# Patient Record
Sex: Male | Born: 1965
Health system: Southern US, Community
[De-identification: ages and names within clinical notes are randomized; demographics above are authoritative.]

## PROBLEM LIST (undated history)

## (undated) DIAGNOSIS — I10 Essential (primary) hypertension: Secondary | ICD-10-CM

## (undated) DIAGNOSIS — M545 Low back pain, unspecified: Secondary | ICD-10-CM

## (undated) DIAGNOSIS — I2510 Atherosclerotic heart disease of native coronary artery without angina pectoris: Secondary | ICD-10-CM

## (undated) DIAGNOSIS — D869 Sarcoidosis, unspecified: Secondary | ICD-10-CM

## (undated) DIAGNOSIS — B9681 Helicobacter pylori [H. pylori] as the cause of diseases classified elsewhere: Secondary | ICD-10-CM

## (undated) DIAGNOSIS — F329 Major depressive disorder, single episode, unspecified: Secondary | ICD-10-CM

## (undated) DIAGNOSIS — K2970 Gastritis, unspecified, without bleeding: Secondary | ICD-10-CM

## (undated) DIAGNOSIS — M1990 Unspecified osteoarthritis, unspecified site: Secondary | ICD-10-CM

## (undated) DIAGNOSIS — H209 Unspecified iridocyclitis: Secondary | ICD-10-CM

## (undated) DIAGNOSIS — F32A Depression, unspecified: Secondary | ICD-10-CM

## (undated) DIAGNOSIS — K922 Gastrointestinal hemorrhage, unspecified: Secondary | ICD-10-CM

## (undated) DIAGNOSIS — R51 Headache: Secondary | ICD-10-CM

## (undated) DIAGNOSIS — R7302 Impaired glucose tolerance (oral): Secondary | ICD-10-CM

## (undated) HISTORY — DX: Unspecified osteoarthritis, unspecified site: M19.90

## (undated) HISTORY — DX: Headache: R51

## (undated) HISTORY — DX: Low back pain, unspecified: M54.50

## (undated) HISTORY — DX: Low back pain: M54.5

## (undated) HISTORY — PX: CATARACT EXTRACTION: SUR2

## (undated) HISTORY — DX: Morbid (severe) obesity due to excess calories: E66.01

## (undated) HISTORY — DX: Essential (primary) hypertension: I10

## (undated) HISTORY — DX: Helicobacter pylori (H. pylori) as the cause of diseases classified elsewhere: B96.81

## (undated) HISTORY — DX: Depression, unspecified: F32.A

## (undated) HISTORY — DX: Impaired glucose tolerance (oral): R73.02

## (undated) HISTORY — DX: Unspecified iridocyclitis: H20.9

## (undated) HISTORY — DX: Major depressive disorder, single episode, unspecified: F32.9

## (undated) HISTORY — DX: Gastrointestinal hemorrhage, unspecified: K92.2

## (undated) HISTORY — DX: Sarcoidosis, unspecified: D86.9

## (undated) HISTORY — DX: Atherosclerotic heart disease of native coronary artery without angina pectoris: I25.10

## (undated) HISTORY — DX: Gastritis, unspecified, without bleeding: K29.70

---

## 1997-09-16 ENCOUNTER — Emergency Department (HOSPITAL_COMMUNITY): Admission: EM | Admit: 1997-09-16 | Discharge: 1997-09-16 | Payer: Self-pay | Admitting: Emergency Medicine

## 1999-04-26 ENCOUNTER — Encounter: Payer: Self-pay | Admitting: Emergency Medicine

## 1999-04-26 ENCOUNTER — Emergency Department (HOSPITAL_COMMUNITY): Admission: EM | Admit: 1999-04-26 | Discharge: 1999-04-26 | Payer: Self-pay | Admitting: Emergency Medicine

## 1999-11-01 ENCOUNTER — Encounter: Payer: Self-pay | Admitting: Emergency Medicine

## 1999-11-01 ENCOUNTER — Emergency Department (HOSPITAL_COMMUNITY): Admission: EM | Admit: 1999-11-01 | Discharge: 1999-11-01 | Payer: Self-pay | Admitting: Emergency Medicine

## 2000-12-04 ENCOUNTER — Emergency Department (HOSPITAL_COMMUNITY): Admission: EM | Admit: 2000-12-04 | Discharge: 2000-12-04 | Payer: Self-pay | Admitting: Emergency Medicine

## 2003-05-07 ENCOUNTER — Observation Stay (HOSPITAL_COMMUNITY): Admission: EM | Admit: 2003-05-07 | Discharge: 2003-05-08 | Payer: Self-pay | Admitting: Emergency Medicine

## 2003-12-07 ENCOUNTER — Emergency Department (HOSPITAL_COMMUNITY): Admission: EM | Admit: 2003-12-07 | Discharge: 2003-12-07 | Payer: Self-pay

## 2004-02-28 ENCOUNTER — Emergency Department (HOSPITAL_COMMUNITY): Admission: EM | Admit: 2004-02-28 | Discharge: 2004-02-28 | Payer: Self-pay | Admitting: Emergency Medicine

## 2004-06-30 ENCOUNTER — Ambulatory Visit: Payer: Self-pay | Admitting: Internal Medicine

## 2004-10-18 ENCOUNTER — Ambulatory Visit: Payer: Self-pay | Admitting: Internal Medicine

## 2004-11-02 ENCOUNTER — Encounter: Admission: RE | Admit: 2004-11-02 | Discharge: 2004-11-02 | Payer: Self-pay | Admitting: Internal Medicine

## 2004-11-18 ENCOUNTER — Ambulatory Visit: Payer: Self-pay | Admitting: Internal Medicine

## 2005-02-13 ENCOUNTER — Ambulatory Visit: Payer: Self-pay | Admitting: Psychiatry

## 2005-02-13 ENCOUNTER — Inpatient Hospital Stay (HOSPITAL_COMMUNITY): Admission: AD | Admit: 2005-02-13 | Discharge: 2005-02-17 | Payer: Self-pay | Admitting: Psychiatry

## 2005-02-13 ENCOUNTER — Emergency Department (HOSPITAL_COMMUNITY): Admission: EM | Admit: 2005-02-13 | Discharge: 2005-02-13 | Payer: Self-pay | Admitting: Emergency Medicine

## 2005-04-19 ENCOUNTER — Emergency Department (HOSPITAL_COMMUNITY): Admission: EM | Admit: 2005-04-19 | Discharge: 2005-04-19 | Payer: Self-pay | Admitting: Family Medicine

## 2005-06-06 ENCOUNTER — Ambulatory Visit: Payer: Self-pay | Admitting: Internal Medicine

## 2005-06-20 ENCOUNTER — Ambulatory Visit: Payer: Self-pay | Admitting: Internal Medicine

## 2005-06-27 ENCOUNTER — Emergency Department (HOSPITAL_COMMUNITY): Admission: EM | Admit: 2005-06-27 | Discharge: 2005-06-28 | Payer: Self-pay | Admitting: Emergency Medicine

## 2005-07-14 ENCOUNTER — Ambulatory Visit: Payer: Self-pay | Admitting: Internal Medicine

## 2005-07-15 ENCOUNTER — Ambulatory Visit: Payer: Self-pay

## 2005-07-21 ENCOUNTER — Ambulatory Visit: Payer: Self-pay | Admitting: Internal Medicine

## 2005-08-04 ENCOUNTER — Ambulatory Visit: Payer: Self-pay | Admitting: Internal Medicine

## 2005-08-08 ENCOUNTER — Ambulatory Visit: Payer: Self-pay | Admitting: Internal Medicine

## 2005-08-09 ENCOUNTER — Ambulatory Visit: Payer: Self-pay | Admitting: Internal Medicine

## 2005-08-11 ENCOUNTER — Ambulatory Visit: Admission: RE | Admit: 2005-08-11 | Discharge: 2005-08-11 | Payer: Self-pay | Admitting: Internal Medicine

## 2005-08-11 ENCOUNTER — Encounter (INDEPENDENT_AMBULATORY_CARE_PROVIDER_SITE_OTHER): Payer: Self-pay | Admitting: *Deleted

## 2005-08-11 ENCOUNTER — Ambulatory Visit: Payer: Self-pay | Admitting: Internal Medicine

## 2005-08-15 ENCOUNTER — Ambulatory Visit: Payer: Self-pay | Admitting: Internal Medicine

## 2005-08-24 ENCOUNTER — Ambulatory Visit: Payer: Self-pay | Admitting: Internal Medicine

## 2005-09-09 ENCOUNTER — Ambulatory Visit: Payer: Self-pay | Admitting: Internal Medicine

## 2005-10-14 ENCOUNTER — Ambulatory Visit: Payer: Self-pay | Admitting: Internal Medicine

## 2005-10-18 ENCOUNTER — Ambulatory Visit: Payer: Self-pay | Admitting: Internal Medicine

## 2005-11-09 ENCOUNTER — Ambulatory Visit: Payer: Self-pay | Admitting: Internal Medicine

## 2005-12-22 ENCOUNTER — Ambulatory Visit: Payer: Self-pay | Admitting: Internal Medicine

## 2006-01-16 ENCOUNTER — Emergency Department (HOSPITAL_COMMUNITY): Admission: EM | Admit: 2006-01-16 | Discharge: 2006-01-16 | Payer: Self-pay | Admitting: Emergency Medicine

## 2006-02-23 ENCOUNTER — Emergency Department (HOSPITAL_COMMUNITY): Admission: EM | Admit: 2006-02-23 | Discharge: 2006-02-23 | Payer: Self-pay | Admitting: Emergency Medicine

## 2006-08-08 ENCOUNTER — Ambulatory Visit: Payer: Self-pay | Admitting: Pulmonary Disease

## 2006-09-01 ENCOUNTER — Ambulatory Visit: Payer: Self-pay | Admitting: Internal Medicine

## 2006-09-01 LAB — CONVERTED CEMR LAB
Angiotensin 1 Converting Enzyme: 42 units/L (ref 9–67)
Bilirubin, Direct: 0.1 mg/dL (ref 0.0–0.3)
CO2: 31 meq/L (ref 19–32)
Eosinophils Absolute: 0.1 10*3/uL (ref 0.0–0.6)
Eosinophils Relative: 0.7 % (ref 0.0–5.0)
GFR calc Af Amer: 85 mL/min
GFR calc non Af Amer: 70 mL/min
Glucose, Bld: 116 mg/dL — ABNORMAL HIGH (ref 70–99)
HCT: 41.4 % (ref 39.0–52.0)
Hemoglobin: 13.8 g/dL (ref 13.0–17.0)
Lymphocytes Relative: 12.8 % (ref 12.0–46.0)
MCV: 93.3 fL (ref 78.0–100.0)
Monocytes Absolute: 0.4 10*3/uL (ref 0.2–0.7)
Neutro Abs: 7.7 10*3/uL (ref 1.4–7.7)
Neutrophils Relative %: 81.3 % — ABNORMAL HIGH (ref 43.0–77.0)
Platelets: 192 10*3/uL (ref 150–400)
Sed Rate: 26 mm/hr — ABNORMAL HIGH (ref 0–20)
Sodium: 142 meq/L (ref 135–145)
Total Protein: 7.3 g/dL (ref 6.0–8.3)
WBC: 9.5 10*3/uL (ref 4.5–10.5)

## 2006-10-05 DIAGNOSIS — D869 Sarcoidosis, unspecified: Secondary | ICD-10-CM

## 2006-10-05 DIAGNOSIS — I10 Essential (primary) hypertension: Secondary | ICD-10-CM

## 2007-01-26 ENCOUNTER — Emergency Department (HOSPITAL_COMMUNITY): Admission: EM | Admit: 2007-01-26 | Discharge: 2007-01-26 | Payer: Self-pay | Admitting: Emergency Medicine

## 2007-02-23 ENCOUNTER — Emergency Department (HOSPITAL_COMMUNITY): Admission: EM | Admit: 2007-02-23 | Discharge: 2007-02-23 | Payer: Self-pay | Admitting: Emergency Medicine

## 2007-03-07 ENCOUNTER — Ambulatory Visit: Payer: Self-pay | Admitting: Internal Medicine

## 2007-03-07 LAB — CONVERTED CEMR LAB
ALT: 14 units/L (ref 0–53)
Albumin: 3.5 g/dL (ref 3.5–5.2)
Alkaline Phosphatase: 60 units/L (ref 39–117)
Basophils Relative: 0.2 % (ref 0.0–1.0)
CO2: 33 meq/L — ABNORMAL HIGH (ref 19–32)
Calcium: 9.1 mg/dL (ref 8.4–10.5)
Eosinophils Absolute: 0.2 10*3/uL (ref 0.0–0.6)
Eosinophils Relative: 2.9 % (ref 0.0–5.0)
Glucose, Bld: 94 mg/dL (ref 70–99)
MCV: 91.3 fL (ref 78.0–100.0)
Monocytes Relative: 9.7 % (ref 3.0–11.0)
Neutro Abs: 5.3 10*3/uL (ref 1.4–7.7)
Platelets: 195 10*3/uL (ref 150–400)
Potassium: 4.4 meq/L (ref 3.5–5.1)
RBC: 4.21 M/uL — ABNORMAL LOW (ref 4.22–5.81)
Sed Rate: 18 mm/hr (ref 0–20)
Total Bilirubin: 1 mg/dL (ref 0.3–1.2)
Total Protein: 7.5 g/dL (ref 6.0–8.3)
WBC: 7.6 10*3/uL (ref 4.5–10.5)

## 2007-04-10 DIAGNOSIS — E785 Hyperlipidemia, unspecified: Secondary | ICD-10-CM

## 2007-04-23 ENCOUNTER — Ambulatory Visit: Payer: Self-pay | Admitting: Internal Medicine

## 2007-04-23 LAB — CONVERTED CEMR LAB
AST: 17 units/L (ref 0–37)
Albumin: 3.8 g/dL (ref 3.5–5.2)
Basophils Absolute: 0 10*3/uL (ref 0.0–0.1)
CO2: 33 meq/L — ABNORMAL HIGH (ref 19–32)
Chloride: 100 meq/L (ref 96–112)
Creatinine, Ser: 1.1 mg/dL (ref 0.4–1.5)
Eosinophils Absolute: 0.1 10*3/uL (ref 0.0–0.6)
GFR calc Af Amer: 94 mL/min
Glucose, Bld: 94 mg/dL (ref 70–99)
HCT: 41.1 % (ref 39.0–52.0)
Hemoglobin: 14.2 g/dL (ref 13.0–17.0)
Lymphocytes Relative: 22.1 % (ref 12.0–46.0)
MCHC: 34.6 g/dL (ref 30.0–36.0)
MCV: 93 fL (ref 78.0–100.0)
Monocytes Absolute: 0.9 10*3/uL — ABNORMAL HIGH (ref 0.2–0.7)
Monocytes Relative: 10 % (ref 3.0–11.0)
Neutro Abs: 6.2 10*3/uL (ref 1.4–7.7)
Neutrophils Relative %: 66.2 % (ref 43.0–77.0)
RDW: 12 % (ref 11.5–14.6)
Sodium: 138 meq/L (ref 135–145)
Total Bilirubin: 1 mg/dL (ref 0.3–1.2)

## 2007-05-11 ENCOUNTER — Encounter: Payer: Self-pay | Admitting: Internal Medicine

## 2007-06-05 ENCOUNTER — Ambulatory Visit: Payer: Self-pay | Admitting: Internal Medicine

## 2007-06-14 ENCOUNTER — Ambulatory Visit: Payer: Self-pay | Admitting: Internal Medicine

## 2007-06-14 DIAGNOSIS — J45909 Unspecified asthma, uncomplicated: Secondary | ICD-10-CM | POA: Insufficient documentation

## 2007-06-14 DIAGNOSIS — R7302 Impaired glucose tolerance (oral): Secondary | ICD-10-CM

## 2007-06-14 DIAGNOSIS — M545 Low back pain: Secondary | ICD-10-CM

## 2007-08-16 ENCOUNTER — Ambulatory Visit: Payer: Self-pay | Admitting: Internal Medicine

## 2007-08-16 LAB — CONVERTED CEMR LAB
ALT: 24 units/L (ref 0–53)
AST: 18 units/L (ref 0–37)
Bilirubin, Direct: 0.1 mg/dL (ref 0.0–0.3)
Eosinophils Absolute: 0.2 10*3/uL (ref 0.0–0.6)
Eosinophils Relative: 2.7 % (ref 0.0–5.0)
HCT: 41.4 % (ref 39.0–52.0)
Hemoglobin: 13.4 g/dL (ref 13.0–17.0)
MCHC: 32.4 g/dL (ref 30.0–36.0)
MCV: 96.8 fL (ref 78.0–100.0)
Monocytes Absolute: 0.7 10*3/uL (ref 0.2–0.7)
Neutrophils Relative %: 66.4 % (ref 43.0–77.0)
RBC: 4.27 M/uL (ref 4.22–5.81)
RDW: 12.6 % (ref 11.5–14.6)
Total Protein: 7.4 g/dL (ref 6.0–8.3)
WBC: 7.6 10*3/uL (ref 4.5–10.5)

## 2007-09-13 ENCOUNTER — Ambulatory Visit: Payer: Self-pay | Admitting: Internal Medicine

## 2007-09-13 DIAGNOSIS — M79609 Pain in unspecified limb: Secondary | ICD-10-CM

## 2007-11-16 ENCOUNTER — Ambulatory Visit: Payer: Self-pay | Admitting: Internal Medicine

## 2007-11-16 LAB — CONVERTED CEMR LAB: Cholesterol: 215 mg/dL (ref 0–200)

## 2007-11-20 ENCOUNTER — Ambulatory Visit: Payer: Self-pay | Admitting: Internal Medicine

## 2007-11-20 ENCOUNTER — Telehealth: Payer: Self-pay | Admitting: Internal Medicine

## 2007-11-20 DIAGNOSIS — R109 Unspecified abdominal pain: Secondary | ICD-10-CM | POA: Insufficient documentation

## 2007-11-22 ENCOUNTER — Telehealth: Payer: Self-pay | Admitting: Internal Medicine

## 2007-11-22 LAB — CONVERTED CEMR LAB
AST: 28 units/L (ref 0–37)
Albumin: 3.9 g/dL (ref 3.5–5.2)
Amylase: 81 units/L (ref 27–131)
BUN: 22 mg/dL (ref 6–23)
Basophils Relative: 0.5 % (ref 0.0–1.0)
Calcium: 9.6 mg/dL (ref 8.4–10.5)
Creatinine, Ser: 1.2 mg/dL (ref 0.4–1.5)
Eosinophils Absolute: 0.1 10*3/uL (ref 0.0–0.7)
Eosinophils Relative: 1 % (ref 0.0–5.0)
GFR calc Af Amer: 85 mL/min
GFR calc non Af Amer: 70 mL/min
HCT: 41.1 % (ref 39.0–52.0)
MCHC: 34.3 g/dL (ref 30.0–36.0)
MCV: 94.6 fL (ref 78.0–100.0)
Monocytes Absolute: 0.2 10*3/uL (ref 0.1–1.0)
Neutro Abs: 11.9 10*3/uL — ABNORMAL HIGH (ref 1.4–7.7)
RBC: 4.34 M/uL (ref 4.22–5.81)
WBC: 12.9 10*3/uL — ABNORMAL HIGH (ref 4.5–10.5)

## 2008-02-18 ENCOUNTER — Ambulatory Visit: Payer: Self-pay | Admitting: Internal Medicine

## 2008-02-18 LAB — CONVERTED CEMR LAB: Blood Glucose, Fingerstick: 83

## 2008-05-16 ENCOUNTER — Ambulatory Visit: Payer: Self-pay | Admitting: Internal Medicine

## 2008-08-04 ENCOUNTER — Ambulatory Visit: Payer: Self-pay | Admitting: Internal Medicine

## 2008-08-04 DIAGNOSIS — R519 Headache, unspecified: Secondary | ICD-10-CM | POA: Insufficient documentation

## 2008-08-04 DIAGNOSIS — R51 Headache: Secondary | ICD-10-CM

## 2008-08-04 LAB — CONVERTED CEMR LAB
ALT: 13 units/L (ref 0–53)
AST: 15 units/L (ref 0–37)
Basophils Absolute: 0 10*3/uL (ref 0.0–0.1)
Basophils Relative: 0.1 % (ref 0.0–3.0)
Bilirubin, Direct: 0.1 mg/dL (ref 0.0–0.3)
CO2: 33 meq/L — ABNORMAL HIGH (ref 19–32)
Chloride: 101 meq/L (ref 96–112)
Creatinine, Ser: 1 mg/dL (ref 0.4–1.5)
Lymphocytes Relative: 21.6 % (ref 12.0–46.0)
MCHC: 34.1 g/dL (ref 30.0–36.0)
Neutrophils Relative %: 67.2 % (ref 43.0–77.0)
RBC: 4.28 M/uL (ref 4.22–5.81)
Total Bilirubin: 0.9 mg/dL (ref 0.3–1.2)
WBC: 8.3 10*3/uL (ref 4.5–10.5)

## 2008-08-07 ENCOUNTER — Encounter (INDEPENDENT_AMBULATORY_CARE_PROVIDER_SITE_OTHER): Payer: Self-pay | Admitting: *Deleted

## 2008-10-02 ENCOUNTER — Ambulatory Visit: Payer: Self-pay | Admitting: Internal Medicine

## 2008-11-30 ENCOUNTER — Emergency Department (HOSPITAL_COMMUNITY): Admission: EM | Admit: 2008-11-30 | Discharge: 2008-11-30 | Payer: Self-pay | Admitting: Emergency Medicine

## 2008-12-18 ENCOUNTER — Ambulatory Visit: Payer: Self-pay | Admitting: Internal Medicine

## 2008-12-18 DIAGNOSIS — F341 Dysthymic disorder: Secondary | ICD-10-CM

## 2009-01-02 ENCOUNTER — Ambulatory Visit: Payer: Self-pay | Admitting: Internal Medicine

## 2009-02-05 ENCOUNTER — Encounter: Payer: Self-pay | Admitting: Internal Medicine

## 2009-06-02 ENCOUNTER — Ambulatory Visit: Payer: Self-pay | Admitting: Internal Medicine

## 2009-06-20 ENCOUNTER — Emergency Department (HOSPITAL_COMMUNITY): Admission: EM | Admit: 2009-06-20 | Discharge: 2009-06-20 | Payer: Self-pay | Admitting: Emergency Medicine

## 2009-07-03 ENCOUNTER — Ambulatory Visit: Payer: Self-pay | Admitting: Internal Medicine

## 2009-08-19 ENCOUNTER — Inpatient Hospital Stay (HOSPITAL_COMMUNITY)
Admission: EM | Admit: 2009-08-19 | Discharge: 2009-08-22 | Payer: Self-pay | Source: Home / Self Care | Admitting: Emergency Medicine

## 2009-08-20 ENCOUNTER — Ambulatory Visit: Payer: Self-pay | Admitting: Internal Medicine

## 2009-08-21 ENCOUNTER — Encounter: Payer: Self-pay | Admitting: Internal Medicine

## 2009-08-23 ENCOUNTER — Inpatient Hospital Stay (HOSPITAL_COMMUNITY): Admission: EM | Admit: 2009-08-23 | Discharge: 2009-08-27 | Payer: Self-pay | Admitting: Emergency Medicine

## 2009-08-24 ENCOUNTER — Encounter: Payer: Self-pay | Admitting: Gastroenterology

## 2009-08-24 ENCOUNTER — Encounter (INDEPENDENT_AMBULATORY_CARE_PROVIDER_SITE_OTHER): Payer: Self-pay | Admitting: Internal Medicine

## 2009-09-07 ENCOUNTER — Ambulatory Visit: Payer: Self-pay | Admitting: Internal Medicine

## 2009-09-07 DIAGNOSIS — A048 Other specified bacterial intestinal infections: Secondary | ICD-10-CM | POA: Insufficient documentation

## 2009-09-14 ENCOUNTER — Ambulatory Visit: Payer: Self-pay | Admitting: Internal Medicine

## 2009-09-14 ENCOUNTER — Inpatient Hospital Stay (HOSPITAL_COMMUNITY)
Admission: EM | Admit: 2009-09-14 | Discharge: 2009-09-18 | Payer: Self-pay | Source: Home / Self Care | Admitting: Emergency Medicine

## 2009-09-15 ENCOUNTER — Encounter (INDEPENDENT_AMBULATORY_CARE_PROVIDER_SITE_OTHER): Payer: Self-pay | Admitting: Cardiology

## 2009-09-26 ENCOUNTER — Emergency Department (HOSPITAL_COMMUNITY): Admission: EM | Admit: 2009-09-26 | Discharge: 2009-09-26 | Payer: Self-pay | Admitting: Emergency Medicine

## 2009-11-28 ENCOUNTER — Emergency Department (HOSPITAL_COMMUNITY): Admission: EM | Admit: 2009-11-28 | Discharge: 2009-11-28 | Payer: Self-pay | Admitting: Emergency Medicine

## 2009-11-29 ENCOUNTER — Emergency Department (HOSPITAL_COMMUNITY): Admission: EM | Admit: 2009-11-29 | Discharge: 2009-11-29 | Payer: Self-pay | Admitting: Emergency Medicine

## 2009-12-03 ENCOUNTER — Ambulatory Visit: Payer: Self-pay | Admitting: Family Medicine

## 2009-12-03 DIAGNOSIS — R1084 Generalized abdominal pain: Secondary | ICD-10-CM | POA: Insufficient documentation

## 2009-12-03 DIAGNOSIS — N410 Acute prostatitis: Secondary | ICD-10-CM

## 2009-12-03 LAB — CONVERTED CEMR LAB
Blood in Urine, dipstick: NEGATIVE
Nitrite: NEGATIVE
Protein, U semiquant: NEGATIVE
Urobilinogen, UA: 0.2
WBC Urine, dipstick: NEGATIVE

## 2009-12-04 LAB — CONVERTED CEMR LAB
Basophils Absolute: 0 10*3/uL (ref 0.0–0.1)
Eosinophils Absolute: 0.2 10*3/uL (ref 0.0–0.7)
HCT: 41.8 % (ref 39.0–52.0)
Hemoglobin: 14 g/dL (ref 13.0–17.0)
Lymphs Abs: 2.6 10*3/uL (ref 0.7–4.0)
MCHC: 33.4 g/dL (ref 30.0–36.0)
Neutro Abs: 6.8 10*3/uL (ref 1.4–7.7)
RDW: 12.8 % (ref 11.5–14.6)

## 2009-12-11 ENCOUNTER — Ambulatory Visit: Payer: Self-pay | Admitting: Internal Medicine

## 2009-12-11 LAB — CONVERTED CEMR LAB
BUN: 17 mg/dL (ref 6–23)
Bilirubin, Direct: 0.1 mg/dL (ref 0.0–0.3)
CO2: 32 meq/L (ref 19–32)
Calcium: 9.2 mg/dL (ref 8.4–10.5)
Cholesterol: 216 mg/dL — ABNORMAL HIGH (ref 0–200)
Creatinine, Ser: 1.1 mg/dL (ref 0.4–1.5)
TSH: 0.94 microintl units/mL (ref 0.35–5.50)
Total Bilirubin: 0.5 mg/dL (ref 0.3–1.2)
Total Protein: 7.8 g/dL (ref 6.0–8.3)

## 2009-12-13 ENCOUNTER — Emergency Department (HOSPITAL_COMMUNITY): Admission: EM | Admit: 2009-12-13 | Discharge: 2009-12-13 | Payer: Self-pay | Admitting: Emergency Medicine

## 2009-12-16 ENCOUNTER — Ambulatory Visit: Payer: Self-pay | Admitting: Cardiology

## 2010-01-27 ENCOUNTER — Telehealth: Payer: Self-pay | Admitting: Family Medicine

## 2010-01-27 ENCOUNTER — Emergency Department (HOSPITAL_COMMUNITY): Admission: EM | Admit: 2010-01-27 | Discharge: 2010-01-27 | Payer: Self-pay | Admitting: Emergency Medicine

## 2010-01-28 ENCOUNTER — Ambulatory Visit: Payer: Self-pay | Admitting: Internal Medicine

## 2010-03-16 ENCOUNTER — Observation Stay (HOSPITAL_COMMUNITY): Admission: EM | Admit: 2010-03-16 | Discharge: 2010-03-16 | Payer: Self-pay | Admitting: Emergency Medicine

## 2010-06-04 ENCOUNTER — Emergency Department (HOSPITAL_COMMUNITY)
Admission: EM | Admit: 2010-06-04 | Discharge: 2010-06-05 | Payer: Self-pay | Source: Home / Self Care | Admitting: Emergency Medicine

## 2010-06-11 ENCOUNTER — Other Ambulatory Visit: Payer: Self-pay | Admitting: Internal Medicine

## 2010-06-11 ENCOUNTER — Ambulatory Visit
Admission: RE | Admit: 2010-06-11 | Discharge: 2010-06-11 | Payer: Self-pay | Source: Home / Self Care | Attending: Internal Medicine | Admitting: Internal Medicine

## 2010-06-11 DIAGNOSIS — I472 Ventricular tachycardia: Secondary | ICD-10-CM | POA: Insufficient documentation

## 2010-06-11 LAB — LIPID PANEL
Cholesterol: 214 mg/dL — ABNORMAL HIGH (ref 0–200)
HDL: 58.8 mg/dL (ref >39.00)
Total CHOL/HDL Ratio: 4
Triglycerides: 68 mg/dL (ref 0.0–149.0)
VLDL: 13.6 mg/dL (ref 0.0–40.0)

## 2010-06-11 LAB — LDL CHOLESTEROL, DIRECT: Direct LDL: 151.1 mg/dL

## 2010-06-11 LAB — PSA: PSA: 0.33 ng/mL (ref 0.10–4.00)

## 2010-06-11 LAB — CONVERTED CEMR LAB: Blood Glucose, Fasting: 71 mg/dL

## 2010-06-11 LAB — HEMOGLOBIN A1C: Hgb A1c MFr Bld: 5.3 % (ref 4.6–6.5)

## 2010-06-16 ENCOUNTER — Ambulatory Visit
Admission: RE | Admit: 2010-06-16 | Discharge: 2010-06-16 | Payer: Self-pay | Source: Home / Self Care | Attending: Internal Medicine | Admitting: Internal Medicine

## 2010-06-16 ENCOUNTER — Encounter: Payer: Self-pay | Admitting: Internal Medicine

## 2010-06-16 DIAGNOSIS — I2510 Atherosclerotic heart disease of native coronary artery without angina pectoris: Secondary | ICD-10-CM | POA: Insufficient documentation

## 2010-06-17 ENCOUNTER — Telehealth: Payer: Self-pay | Admitting: Internal Medicine

## 2010-06-18 ENCOUNTER — Telehealth (INDEPENDENT_AMBULATORY_CARE_PROVIDER_SITE_OTHER): Payer: Self-pay | Admitting: *Deleted

## 2010-06-19 ENCOUNTER — Encounter: Payer: Self-pay | Admitting: Orthopedic Surgery

## 2010-06-20 ENCOUNTER — Encounter: Payer: Self-pay | Admitting: Internal Medicine

## 2010-06-29 NOTE — Assessment & Plan Note (Signed)
Summary: headaches/dm   Vital Signs:  Patient profile:   45 year old male Temp:     98.4 degrees F oral BP sitting:   122 / 84  (left arm) Cuff size:   large  Vitals Entered By: Raechel Ache, RN (June 02, 2009 4:05 PM) CC: C/o headaches. Not taking BP med.   Primary Care Provider:  Gordy Savers  MD  CC:  C/o headaches. Not taking BP med.Marland Kitchen  History of Present Illness: 45 year old patient who presents with a 4 week history of headaches.  He describes multiple episodes of brief fleeting, sharp, shooting pain in a rather generalized distribution.  He states the pain is quite sharp.  The pain  lasts  just a second is localized in the frontal bitemporal and also occipital area.  No other associated symptoms. He has a history of hypertension, but is titrated himself off medication.  He continues to monitor her home low-pressure readings with nice results.  He takes no other medication.  He also has a history of depression, which has been stable off Cymbalta.  Allergies: 1)  ! Codeine  Past History:  Past Medical History: Hypertension Sarcoidosis    FOB 08/11/05 pos ncg with airway involvemnt, steroid dep since 3/07 Mobid obesity, wt 340 when started prednisone history glucose intolerance ocular sarcoid ( uveitis) Asthma Low back pain  right knee, DJD Headache depression 2006  Review of Systems       The patient complains of headaches.  The patient denies anorexia, fever, weight loss, weight gain, vision loss, decreased hearing, hoarseness, chest pain, syncope, dyspnea on exertion, peripheral edema, prolonged cough, hemoptysis, abdominal pain, melena, hematochezia, severe indigestion/heartburn, hematuria, incontinence, genital sores, muscle weakness, suspicious skin lesions, transient blindness, difficulty walking, depression, unusual weight change, abnormal bleeding, enlarged lymph nodes, angioedema, breast masses, and testicular masses.    Physical Exam  General:   overweight-appearing.  blood pressure 100/72 Head:  Normocephalic and atraumatic without obvious abnormalities. No apparent alopecia or balding. Eyes:  right fundus not well visualized Ears:  External ear exam shows no significant lesions or deformities.  Otoscopic examination reveals clear canals, tympanic membranes are intact bilaterally without bulging, retraction, inflammation or discharge. Hearing is grossly normal bilaterally. Nose:  External nasal examination shows no deformity or inflammation. Nasal mucosa are pink and moist without lesions or exudates. Mouth:  Oral mucosa and oropharynx without lesions or exudates.  Teeth in good repair. Neck:  No deformities, masses, or tenderness noted. Lungs:  Normal respiratory effort, chest expands symmetrically. Lungs are clear to auscultation, no crackles or wheezes. Heart:  Normal rate and regular rhythm. S1 and S2 normal without gallop, murmur, click, rub or other extra sounds.   Impression & Recommendations:  Problem # 1:  HEADACHE (ICD-784.0)  Complete Medication List: 1)  Methotrexate 2.5 Mg Tabs (Methotrexate sodium) .... 6 weekly---hold 2)  Lisinopril-hydrochlorothiazide 20-25 Mg Tabs (Lisinopril-hydrochlorothiazide) .... One daily  Patient Instructions: 1)  Please schedule a follow-up appointment as needed. 2)  Arthrotec one twice daily as needed 3)  Take 650-1000mg  of Tylenol every 4-6 hours as needed for relief of pain or comfort of fever AVOID taking more than 4000mg   in a 24 hour period (can cause liver damage in higher doses).

## 2010-06-29 NOTE — Progress Notes (Signed)
Summary: Severe Headache.  Phone Note Call from Patient   Caller: Patient Summary of Call: pt called to report has head headache for past 5 days "headache like never before". states took about 30 tyleonl --2 tabs every hour.  c/o nausea and arm feeling weak. states not on any meds and denies any speech difficulties. Pt was advised to let family member come and take him to ER or call 911 and be transported to ER. Initial call taken by: Pura Spice, RN,  January 27, 2010 3:02 PM  Follow-up for Phone Call        Good plan Follow-up by: Danise Edge MD,  January 27, 2010 3:07 PM

## 2010-06-29 NOTE — Procedures (Signed)
Summary: EGD with biopsy, balloon dilatation/[facility]  EGD with biopsy, balloon dilatation/[facility]   Imported By: Maryln Gottron 09/28/2009 13:26:59  _____________________________________________________________________  External Attachment:    Type:   Image     Comment:   External Document

## 2010-06-29 NOTE — Assessment & Plan Note (Signed)
Summary: SIDE/BACK PAIN/RCD   Vital Signs:  Patient profile:   45 year old male Temp:     98.4 degrees F oral BP sitting:   130 / 80  (left arm) Cuff size:   large  Vitals Entered By: Duard Brady LPN (December 11, 2009 11:51 AM) CC: c/o low back pain , (L) and (R) side pain Is Patient Diabetic? No   Primary Care Provider:  Gordy Savers  MD  CC:  c/o low back pain  and (L) and (R) side pain.  History of Present Illness: 45 year old patient, who presents with a chief complaint of low back, flank, and generalized abdominal pain.  He has a history of sarcoidosis in the past.  He does have chronic low back pain and morbid obesity.  He has been treated in the past for H. pylori gastritis.  He was seen recently and is completing antibiotic therapy for suspected acute prostatitis.  He is on no medication other than antibiotic therapy.  He denies any nausea, vomiting, fever, chills, weight loss.  He also has a history of asthma, which has been stable  Allergies: 1)  ! Codeine  Past History:  Past Medical History: Reviewed history from 09/07/2009 and no changes required. Hypertension Sarcoidosis    FOB 08/11/05 pos ncg with airway involvemnt, steroid dep since 3/07 Mobid obesity, wt 340 when started prednisone history glucose intolerance ocular sarcoid ( uveitis) Asthma Low back pain  right knee, DJD Headache depression  upper GI bleed March 2011 H. pylori gastritis  Review of Systems       The patient complains of abdominal pain and muscle weakness.  The patient denies anorexia, fever, weight loss, weight gain, vision loss, decreased hearing, hoarseness, chest pain, syncope, dyspnea on exertion, peripheral edema, prolonged cough, headaches, hemoptysis, melena, hematochezia, severe indigestion/heartburn, hematuria, incontinence, genital sores, suspicious skin lesions, transient blindness, difficulty walking, depression, unusual weight change, abnormal bleeding, enlarged  lymph nodes, angioedema, breast masses, and testicular masses.    Physical Exam  General:  morbidly obese.  Blood pressure 130/80.  He appears in no acute distress Head:  Normocephalic and atraumatic without obvious abnormalities. No apparent alopecia or balding. Mouth:  Oral mucosa and oropharynx without lesions or exudates.  Teeth in good repair. Neck:  No deformities, masses, or tenderness noted. Lungs:  Normal respiratory effort, chest expands symmetrically. Lungs are clear to auscultation, no crackles or wheezes. Heart:  Normal rate and regular rhythm. S1 and S2 normal without gallop, murmur, click, rub or other extra sounds. Abdomen:  morbidly obese there is clinical suspicion for hepatic and possibly splenic enlargement he appear to be mildly diffusely tender; bowel sounds were normal no guarding or rebound Msk:  No deformity or scoliosis noted of thoracic or lumbar spine.   Extremities:  No clubbing, cyanosis, edema, or deformity noted with normal full range of motion of all joints.   Skin:  Intact without suspicious lesions or rashes Cervical Nodes:  No lymphadenopathy noted   Impression & Recommendations:  Problem # 1:  ABDOMINAL PAIN, GENERALIZED (ICD-789.07)  Orders: Venipuncture (16109) TLB-BMP (Basic Metabolic Panel-BMET) (80048-METABOL) TLB-Hepatic/Liver Function Pnl (80076-HEPATIC) TLB-TSH (Thyroid Stimulating Hormone) (60454-UJW) Radiology Referral (Radiology)  Problem # 2:  ACUTE PROSTATITIS (ICD-601.0)  Orders: Venipuncture (11914) TLB-BMP (Basic Metabolic Panel-BMET) (80048-METABOL) TLB-Hepatic/Liver Function Pnl (80076-HEPATIC) TLB-TSH (Thyroid Stimulating Hormone) (84443-TSH)  Problem # 3:  LOW BACK PAIN (ICD-724.2)  The following medications were removed from the medication list:    Aspir-low 81 Mg Tbec (Aspirin) ..... Qd  His updated medication list for this problem includes:    Tramadol Hcl 50 Mg Tabs (Tramadol hcl) ..... One every 6 hours for  pain  The following medications were removed from the medication list:    Aspir-low 81 Mg Tbec (Aspirin) ..... Qd His updated medication list for this problem includes:    Tramadol Hcl 50 Mg Tabs (Tramadol hcl) ..... One every 6 hours for pain  Problem # 4:  HYPERTENSION (ICD-401.9)  The following medications were removed from the medication list:    Lisinopril-hydrochlorothiazide 20-25 Mg Tabs (Lisinopril-hydrochlorothiazide) ..... One daily    Metoprolol Tartrate 25 Mg Tabs (Metoprolol tartrate) .Marland Kitchen... Take 1/2 bid    The following medications were removed from the medication list:    Lisinopril-hydrochlorothiazide 20-25 Mg Tabs (Lisinopril-hydrochlorothiazide) ..... One daily    Metoprolol Tartrate 25 Mg Tabs (Metoprolol tartrate) .Marland Kitchen... Take 1/2 bid  Orders: Venipuncture (81191) TLB-BMP (Basic Metabolic Panel-BMET) (80048-METABOL) TLB-Hepatic/Liver Function Pnl (80076-HEPATIC) TLB-TSH (Thyroid Stimulating Hormone) (84443-TSH)  Complete Medication List: 1)  Ciprofloxacin Hcl 500 Mg Tabs (Ciprofloxacin hcl) .... One by mouth two times a day for 10 days 2)  Tramadol Hcl 50 Mg Tabs (Tramadol hcl) .... One every 6 hours for pain  Other Orders: TLB-Cholesterol, Total (82465-CHO)  Patient Instructions: 1)  Please schedule a follow-up appointment in 2 weeks. 2)  Limit your Sodium (Salt). Prescriptions: TRAMADOL HCL 50 MG TABS (TRAMADOL HCL) one every 6 hours for pain  #50 x 3   Entered and Authorized by:   Gordy Savers  MD   Signed by:   Gordy Savers  MD on 12/11/2009   Method used:   Electronically to        [facility] [phone]* (retail)       [address]       [place], [place]  [address]       Ph: [phone]       Fax: [phone]   RxID:   [id number]

## 2010-06-29 NOTE — Procedures (Signed)
Summary: Upper Endoscopy  Patient: [name] Note: All result statuses are Final unless otherwise noted.  Tests: (1) Upper Endoscopy (EGD)   EGD Upper Endoscopy       DONE     [facility]     [address]     [place], [place]  [address]           ENDOSCOPY PROCEDURE REPORT           PATIENT:  [name], Marrow  MR#:  [id number]     BIRTHDATE:  [birth year], 44 yrs. old  GENDER:  male           ENDOSCOPIST:  Barbette Hair. Arlyce Dice, MD     Referred by:           PROCEDURE DATE:  08/24/2009     PROCEDURE:  EGD with biopsy     ASA CLASS:  Class II     INDICATIONS:  hematemesis           MEDICATIONS:   Fentanyl 75 mcg IV, Versed 7.5 mg IV,     glycopyrrolate (Robinal) 0.2 mg IV     TOPICAL ANESTHETIC:  Cetacaine Spray           DESCRIPTION OF PROCEDURE:   After the risks benefits and     alternatives of the procedure were thoroughly explained, informed     consent was obtained.  The EG-2990i ([id number]) endoscope was     introduced through the mouth and advanced to the third portion of     the duodenum, without limitations.  The instrument was slowly     withdrawn as the mucosa was fully examined.     <<PROCEDUREIMAGES>>           irregular Z-line at the gastroesophageal junction. Irregular Z     line with extension of gastric appearing mucosa 1-2cm proximally.     Bxs taken (see image008).  Mild gastritis was found in the antrum     (see image005).  Duodenitis was found (see image003). No fresh or     old blood in duodenum or stomach    Retroflexed views revealed no     abnormalities.    The scope was then withdrawn from the patient     and the procedure completed.           COMPLICATIONS:  None           ENDOSCOPIC IMPRESSION:     1) Irregular Z-line at the gastroesophageal junction - r/o     Barrett's esophagus     2) Mild gastritis in the antrum     3) Duodenitis           No definite source for limited hemetemasis identified        RECOMMENDATIONS:     1) CT Scan     2) continue PPI therapy           REPEAT EXAM:  No           ______________________________     Barbette Hair. Arlyce Dice, MD           CC:  Yancey Flemings, MD           n.     Rosalie Doctor:   Barbette Hair. Zaviyar Rahal at 08/24/2009 03:21 PM           [place], [place], [phone]  Note: An exclamation mark (!) indicates a result that was not dispersed into the flowsheet. Document Creation  Date: 08/24/2009 3:22 PM _______________________________________________________________________  (1) Order result status: Final Collection or observation date-time: 08/24/2009 15:15 Requested date-time:  Receipt date-time:  Reported date-time:  Referring Physician:   Ordering Physician: Melvia Heaps [phone]) Specimen Source:  Source: Launa Grill Order Number: [id number] Lab site:

## 2010-06-29 NOTE — Miscellaneous (Signed)
Summary: CLO report  Clinical Lists Changes  -Clotest (H. pylori), Biopsy - STATUS: Final                                            Perform Date: 25Mar11 16:30  Ordered By: Earley Favor Date: 25Mar11 16:34                                       Last Updated Date: 26Mar11 17:37  Facility: [facility]                              Department: [facility]CR  Accession #: [id number]                  USN:       [id number]  Findings  Result Name                              Result     Abnl   Normal Range     Units      Perf. Loc.  Helicobacter Screen                      SEE NOTE.  a      URENEG    UREASE POSITIVE    CLOTEST DETECTS THE UREASE    ENZYME OF HELICOBACTER    PYLORI IN GASTRIC MUCOSAL    BIOPSIES.  Additional Information  HL7 RESULT STATUS : F  External IF Update Timestamp : 2009-08-22:17:34:00.000000

## 2010-06-29 NOTE — Procedures (Signed)
Summary: Upper Endoscopy  Patient: [name] Note: All result statuses are Final unless otherwise noted.  Tests: (1) Upper Endoscopy (EGD)   EGD Upper Endoscopy       DONE     [facility]     [address]     [place], [place]  [address]           ENDOSCOPY PROCEDURE REPORT           PATIENT:  [name], [name]  MR#:  [id number]     BIRTHDATE:  1966/01/30, 44 yrs. old  GENDER:  male           ENDOSCOPIST:  Wilhemina Bonito. Eda Keys, MD     Referred by:  [facility]           PROCEDURE DATE:  08/21/2009     PROCEDURE:  EGD with biopsy,     EGD with balloon dilatation -     18-24mm     ASA CLASS:  Class III     INDICATIONS:  dysphagia, GERD           MEDICATIONS:   Fentanyl 75 mcg IV, Versed 6 mg IV     TOPICAL ANESTHETIC:  Cetacaine Spray           DESCRIPTION OF PROCEDURE:   After the risks benefits and     alternatives of the procedure were thoroughly explained, informed     consent was obtained.  The EG-2990i ([id number]) endoscope was     introduced through the mouth and advanced to the second portion of     the duodenum, without limitations.  The instrument was slowly     withdrawn as the mucosa was fully examined.     <<PROCEDUREIMAGES>>           A large caliber  esophageal ring was found in the distal     esophagus.  Moderate erosive gastritis was found in the antrum.     Nonerosive Duodenitis was found in the bulb of the duodenum. CLO Bx     taken.    Retroflexed views revealed a hiatal hernia.           THERAPY: TTS BALLOON DILATION 18-19-20MM   W/O RESISTANCE OR HEME.     TOLERATED WELL           The scope was then withdrawn from the patient and the procedure     completed.           COMPLICATIONS:  None           ENDOSCOPIC IMPRESSION:     1) Ring, esophageal in the distal esophagus - S/P DILATION     2) Moderate gastritis in the antrum - S/P CLO TEST     3) Duodenitis in the bulb of duodenum     4) A hiatal hernia        RECOMMENDATIONS:     1) CONTINUE PPI bid     2) Rx CLO if positive     30 GI F/U PRN           ______________________________     Wilhemina Bonito. Eda Keys, MD           CC:  Gordy Savers, MD, [facility] TEAM 5           n.     eSIGNED:   Wilhemina Bonito. Eda Keys at 08/21/2009 03:05 PM  [place], [place], [phone]  Note: An exclamation mark (!) indicates a result that was not dispersed into the flowsheet. Document Creation Date: 08/21/2009 3:06 PM _______________________________________________________________________  (1) Order result status: Final Collection or observation date-time: 08/21/2009 14:56 Requested date-time:  Receipt date-time:  Reported date-time:  Referring Physician:   Ordering Physician: Fransico Setters [phone]) Specimen Source:  Source: Launa Grill Order Number: [id number] Lab site:

## 2010-06-29 NOTE — Assessment & Plan Note (Signed)
Summary: PAIN IN ABD SIDE X 2 DAYS/HEADACHE/K PT   Vital Signs:  Patient profile:   45 year old male Temp:     98 degrees F oral BP sitting:   130 / 90  (left arm) Cuff size:   large  Vitals Entered By: Sid Falcon LPN (December 03, 1608 2:58 PM) CC: abdominal pain, trouble with urination   History of Present Illness: Patient is a work in with a history of poorly localized abdominal pain. Also complains of some intermittent headaches and diffuse body aches and poorly localized backache. He describes some trouble urinating with slightly slow stream and some mild burning with urination. No fever. Possible chills off and on. Denies nausea, vomiting, or diarrhea. No penile discharge.  No exacerbating or alleviating factors.  Medical problems include morbid obesity, hypertension, history sarcoidosis, hyperlipidemia, reported asthma history. He is currently taking no medications. History of poor compliance with medications.  Allergies: 1)  ! Codeine  Past History:  Past Medical History: Last updated: 09/07/2009 Hypertension Sarcoidosis    FOB 08/11/05 pos ncg with airway involvemnt, steroid dep since 3/07 Mobid obesity, wt 340 when started prednisone history glucose intolerance ocular sarcoid ( uveitis) Asthma Low back pain  right knee, DJD Headache depression  upper GI bleed March 2011 H. pylori gastritis  Past Surgical History: Last updated: 06/14/2007 status post left cataract surgery  Social History: Last updated: 06/14/2007 Patient never smoked.  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  The patient denies anorexia, fever, weight loss, weight gain, chest pain, syncope, dyspnea on exertion,  peripheral edema, prolonged cough, melena, hematochezia, hematuria, and incontinence.    Physical Exam  General:  Morbidly obese in no acute distress; alert,appropriate and cooperative throughout examination Head:  Normocephalic and atraumatic without obvious abnormalities. No apparent alopecia or balding. Mouth:  Oral mucosa and oropharynx without lesions or exudates.  Teeth in good repair. Neck:  No deformities, masses, or tenderness noted. Lungs:  Normal respiratory effort, chest expands symmetrically. Lungs are clear to auscultation, no crackles or wheezes. Heart:  normal rate and regular rhythm.   Abdomen:  Slightly tender RUQ and diffuse lower abd bil.soft, normal bowel sounds, no distention, no guarding, no rigidity, no rebound tenderness, no abdominal hernia, no hepatomegaly, and no splenomegaly.   Rectal:  No external abnormalities noted. Normal sphincter tone. No rectal masses or tenderness. Prostate:  tender to palpation but not enlarged.   Impression & Recommendations:  Problem # 1:  ACUTE PROSTATITIS (ICD-601.0) Assessment New start cipro.  Difficult to determine if this is accounting for some of his symptoms.  Problem # 2:  ABDOMINAL PAIN, GENERALIZED (ICD-789.07) check CBC.  Doubt related to #1.  benign exam at this time. Orders: UA Dipstick w/o Micro (manual) ([address]) Venipuncture (40981) TLB-CBC Platelet - w/Differential (85025-CBCD)  Problem # 3:  MORBID OBESITY (ICD-278.01)  Complete Medication List: 1)  Methotrexate 2.5 Mg Tabs (Methotrexate sodium) .... 6 weekly---hold 2)  Lisinopril-hydrochlorothiazide 20-25 Mg Tabs (Lisinopril-hydrochlorothiazide) .... One daily 3)  Amoxicillin 500 Mg Caps (Amoxicillin) .... 2 two times a day x10days 4)  Metronidazole 500 Mg Tabs (Metronidazole) .... Three times a day x10days 5)  Prilosec 40 Mg Cpdr (Omeprazole) .... Bid 6)  Aspir-low 81 Mg  Tbec (Aspirin) .... Qd 7)  Metoprolol Tartrate 25 Mg Tabs (Metoprolol tartrate) ....  Take 1/2 bid 8)  Ciprofloxacin Hcl 500 Mg Tabs (Ciprofloxacin hcl) .... One by mouth two times a day for 10 days  Patient Instructions: 1)  Drink plenty of fluids 2)  Consider sitz baths daily 3)  Follow up promptly if you have any worsening abdominal pain or increased fever or other new symptoms Prescriptions: CIPROFLOXACIN HCL 500 MG TABS (CIPROFLOXACIN HCL) one by mouth two times a day for 10 days  #20 x 0   Entered and Authorized by:   Evelena Peat MD   Signed by:   Evelena Peat MD on 12/03/2009   Method used:   Electronically to        [facility] [address] [phone]* (retail)       [address]       [place], [place]  [address]       Ph: [phone]       Fax: [phone]   RxID:   [id number]   Laboratory Results   Urine Tests    Routine Urinalysis   Color: yellow Appearance: Clear Glucose: negative   (Normal Range: Negative) Bilirubin: negative   (Normal Range: Negative) Ketone: negative   (Normal Range: Negative) Spec. Gravity: 1.025   (Normal Range: 1.003-1.035) Blood: negative   (Normal Range: Negative) pH: 5.0   (Normal Range: 5.0-8.0) Protein: negative   (Normal Range: Negative) Urobilinogen: 0.2   (Normal Range: 0-1) Nitrite: negative   (Normal Range: Negative) Leukocyte Esterace: negative   (Normal Range: Negative)    Comments: Sid Falcon LPN  December 04, 2438 3:30 PM

## 2010-06-29 NOTE — Assessment & Plan Note (Signed)
Summary: follow up/cjr   Vital Signs:  Patient profile:   45 year old male Temp:     98.2 degrees F oral BP sitting:   112 / 70  (right arm) Cuff size:   large  Vitals Entered By: Duard Brady LPN (September 07, 2009 10:48 AM) CC: post hospital - had EGD??? Is Patient Diabetic? No    Primary Care Provider:  Gordy Savers  MD  CC:  post hospital - had EGD???.  History of Present Illness: 45 year old patient who is seen today post hospital follow-up.  he has done well since this discharge and has had no further acute abdominal pain.  He was admitted with a low volume.  Upper GI bleed, secondary to esophagitis, duodenitis.  He presently is completing antibiotic therapy for H. pylori positive gastritis.  He is also on that resolved 40 mg b.i.d.  Possible records reviewed.  Laboratory studies reviewed.  Preventive Screening-Counseling & Management  Alcohol-Tobacco     Smoking Status: never  Allergies: 1)  ! Codeine  Past History:  Past Medical History: Hypertension Sarcoidosis    FOB 08/11/05 pos ncg with airway involvemnt, steroid dep since 3/07 Mobid obesity, wt 340 when started prednisone history glucose intolerance ocular sarcoid ( uveitis) Asthma Low back pain  right knee, DJD Headache depression  upper GI bleed March 2011 H. pylori gastritis  Review of Systems  The patient denies anorexia, fever, weight loss, weight gain, vision loss, decreased hearing, hoarseness, chest pain, syncope, dyspnea on exertion, peripheral edema, prolonged cough, headaches, hemoptysis, abdominal pain, melena, hematochezia, severe indigestion/heartburn, hematuria, incontinence, genital sores, muscle weakness, suspicious skin lesions, transient blindness, difficulty walking, depression, unusual weight change, abnormal bleeding, enlarged lymph nodes, angioedema, breast masses, and testicular masses.    Physical Exam  General:  morbid obesit 122/82 Head:  Normocephalic and  atraumatic without obvious abnormalities. No apparent alopecia or balding. Mouth:  Oral mucosa and oropharynx without lesions or exudates.  Teeth in good repair. Neck:  No deformities, masses, or tenderness noted. Lungs:  Normal respiratory effort, chest expands symmetrically. Lungs are clear to auscultation, no crackles or wheezes. Heart:  Normal rate and regular rhythm. S1 and S2 normal without gallop, murmur, click, rub or other extra sounds. Abdomen:  obese soft and nontender.  Normal bowel sounds Msk:  No deformity or scoliosis noted of thoracic or lumbar spine.   Pulses:  R and L carotid,radial,femoral,dorsalis pedis and posterior tibial pulses are full and equal bilaterally Extremities:  No clubbing, cyanosis, edema, or deformity noted with normal full range of motion of all joints.     Impression & Recommendations:  Problem # 1:  MORBID OBESITY (ICD-278.01)  Problem # 2:  HYPERTENSION (ICD-401.9)  His updated medication list for this problem includes:    Lisinopril-hydrochlorothiazide 20-25 Mg Tabs (Lisinopril-hydrochlorothiazide) ..... One daily    Metoprolol Tartrate 25 Mg Tabs (Metoprolol tartrate) .Marland Kitchen... Take 1/2 bid  His updated medication list for this problem includes:    Lisinopril-hydrochlorothiazide 20-25 Mg Tabs (Lisinopril-hydrochlorothiazide) ..... One daily    Metoprolol Tartrate 25 Mg Tabs (Metoprolol tartrate) .Marland Kitchen... Take 1/2 bid  Problem # 3:  HELICOBACTER PYLORI [H. PYLORI] INFECTION (ICD-041.86)  Complete Medication List: 1)  Methotrexate 2.5 Mg Tabs (Methotrexate sodium) .... 6 weekly---hold 2)  Lisinopril-hydrochlorothiazide 20-25 Mg Tabs (Lisinopril-hydrochlorothiazide) .... One daily 3)  Amoxicillin 500 Mg Caps (Amoxicillin) .... 2 two times a day x10days 4)  Metronidazole 500 Mg Tabs (Metronidazole) .... Three times a day x10days 5)  Prilosec 40 Mg  Cpdr (Omeprazole) .... Bid 6)  Aspir-low 81 Mg Tbec (Aspirin) .... Qd 7)  Metoprolol Tartrate 25 Mg Tabs  (Metoprolol tartrate) .... Take 1/2 bid  Patient Instructions: 1)  Please schedule a follow-up appointment in 6 months. 2)  Limit your Sodium (Salt) to less than 2 grams a day(slightly less than 1/2 a teaspoon) to prevent fluid retention, swelling, or worsening of symptoms. 3)  Avoid foods high in acid (tomatoes, citrus juices, spicy foods). Avoid eating within two hours of lying down or before exercising. Do not over eat; try smaller more frequent meals. Elevate head of bed twelve inches when sleeping. 4)  It is important that you exercise regularly at least 20 minutes 5 times a week. If you develop chest pain, have severe difficulty breathing, or feel very tired , stop exercising immediately and seek medical attention. 5)  You need to lose weight. Consider a lower calorie diet and regular exercise.

## 2010-06-29 NOTE — Assessment & Plan Note (Signed)
Summary: 6 month rov/njr   Vital Signs:  Patient profile:   45 year old male Weight:      339 pounds Temp:     98.5 degrees F oral BP sitting:   130 / 82  (right arm) Cuff size:   large  Vitals Entered By: Raechel Ache, RN (July 03, 2009 10:25 AM) CC: 6 mos f/u , no c/o - not using meds   Primary Care Provider:  Gordy Savers  MD  CC:  6 mos f/u  and no c/o - not using meds.  History of Present Illness:  45 year old gentleman, who has a history of hypertension.  He self discontinued medication.  A number of months ago and has been monitoring home blood pressure readings.  Blood pressure has been stable off drugs.  He was seen one month ago to stable blood pressure and is seen today for follow-up.  Blood pressure remained stable.  He is a history of asthma, depression, and also mild dyslipidemia.  No concerns or complaints today.  Blood pressures at the drug store have remained normal  Allergies: 1)  ! Codeine  Past History:  Past Medical History: Reviewed history from 06/02/2009 and no changes required. Hypertension Sarcoidosis    FOB 08/11/05 pos ncg with airway involvemnt, steroid dep since 3/07 Mobid obesity, wt 340 when started prednisone history glucose intolerance ocular sarcoid ( uveitis) Asthma Low back pain  right knee, DJD Headache depression 2006  Physical Exam  General:  overweight-appearing.  124/80overweight-appearing.   Mouth:  Oral mucosa and oropharynx without lesions or exudates.  Teeth in good repair. Neck:  No deformities, masses, or tenderness noted. Lungs:  Normal respiratory effort, chest expands symmetrically. Lungs are clear to auscultation, no crackles or wheezes. Heart:  Normal rate and regular rhythm. S1 and S2 normal without gallop, murmur, click, rub or other extra sounds.   Impression & Recommendations:  Problem # 1:  MORBID OBESITY (ICD-278.01)  Problem # 2:  HYPERTENSION (ICD-401.9)  His updated medication list for  this problem includes:    Lisinopril-hydrochlorothiazide 20-25 Mg Tabs (Lisinopril-hydrochlorothiazide) ..... One daily  His updated medication list for this problem includes:    Lisinopril-hydrochlorothiazide 20-25 Mg Tabs (Lisinopril-hydrochlorothiazide) ..... One daily  Complete Medication List: 1)  Methotrexate 2.5 Mg Tabs (Methotrexate sodium) .... 6 weekly---hold 2)  Lisinopril-hydrochlorothiazide 20-25 Mg Tabs (Lisinopril-hydrochlorothiazide) .... One daily  Patient Instructions: 1)  Please schedule a follow-up appointment in 1 year. 2)  Limit your Sodium (Salt). 3)  It is important that you exercise regularly at least 20 minutes 5 times a week. If you develop chest pain, have severe difficulty breathing, or feel very tired , stop exercising immediately and seek medical attention. 4)  You need to lose weight. Consider a lower calorie diet and regular exercise.  5)  Check your Blood Pressure regularly. If it is above: 150/90 you should make an appointment.

## 2010-06-29 NOTE — Assessment & Plan Note (Signed)
Summary: HOSPITAL F/U//ALP   Vital Signs:  Patient profile:   45 year old male Temp:     98.0 degrees F oral BP sitting:   120 / 84  (right arm) Cuff size:   large  Vitals Entered By: Duard Brady LPN (January 28, 2010 11:01 AM) CC: f/u from ER visit - c/o headaches x5day - waking with , not relief Is Patient Diabetic? No   Primary Care Provider:  Gordy Savers  MD  CC:  f/u from ER visit - c/o headaches x5day - waking with  and not relief.  History of Present Illness: 45 year old patient who was evaluated in emergency department yesterday due to headaches.  He states that he has had these for the past 5 days.  He describes pain in the posterior neck and occipital scalp region to become fairly generalized.  They have been, most severe in the morning, but then improved throughout the day.  He has been taking Tylenol and Temodal without much benefit.  Acetaminophen level was subtherapeutic yesterday in the ED.  Today he feels improved.  He had very little headache when he awoke this morning and presently feels well;  he does have a history of hypertension, which has a well controlled off medications.  More recently  Preventive Screening-Counseling & Management  Alcohol-Tobacco     Smoking Status: never  Allergies: 1)  ! Codeine  Past History:  Past Medical History: Reviewed history from 09/07/2009 and no changes required. Hypertension Sarcoidosis    FOB 08/11/05 pos ncg with airway involvemnt, steroid dep since 3/07 Mobid obesity, wt 340 when started prednisone history glucose intolerance ocular sarcoid ( uveitis) Asthma Low back pain  right knee, DJD Headache depression  upper GI bleed March 2011 H. pylori gastritis  Physical Exam  General:  morbidly obese blood pressure 120/78 Head:  Normocephalic and atraumatic without obvious abnormalities. No apparent alopecia or balding. Eyes:  left fundus appeared normal with a sharp disk margin, right, not well  visualized Ears:  External ear exam shows no significant lesions or deformities.  Otoscopic examination reveals clear canals, tympanic membranes are intact bilaterally without bulging, retraction, inflammation or discharge. Hearing is grossly normal bilaterally. Mouth:  Oral mucosa and oropharynx without lesions or exudates.  Teeth in good repair. Neck:  No deformities, masses, or tenderness noted. Lungs:  Normal respiratory effort, chest expands symmetrically. Lungs are clear to auscultation, no crackles or wheezes. Heart:  Normal rate and regular rhythm. S1 and S2 normal without gallop, murmur, click, rub or other extra sounds. Abdomen:  Bowel sounds positive,abdomen soft and non-tender without masses, organomegaly or hernias noted. Msk:  the upper back musculature appear to be tight and tense, posterior cervical musculature, felt normal   Impression & Recommendations:  Problem # 1:  HEADACHE (ICD-784.0)  His updated medication list for this problem includes:    Tramadol Hcl 50 Mg Tabs (Tramadol hcl) ..... One every 6 hours for pain    Hydrocodone-acetaminophen 5-500 Mg Tabs (Hydrocodone-acetaminophen) ..... One every 6 hours as needed for pain  Problem # 2:  HYPERTENSION (ICD-401.9)  Complete Medication List: 1)  Tramadol Hcl 50 Mg Tabs (Tramadol hcl) .... One every 6 hours for pain 2)  Hydrocodone-acetaminophen 5-500 Mg Tabs (Hydrocodone-acetaminophen) .... One every 6 hours as needed for pain  Patient Instructions: 1)  Limit your Sodium (Salt) to less than 2 grams a day(slightly less than 1/2 a teaspoon) to prevent fluid retention, swelling, or worsening of symptoms. 2)  It is  important that you exercise regularly at least 20 minutes 5 times a week. If you develop chest pain, have severe difficulty breathing, or feel very tired , stop exercising immediately and seek medical attention. 3)  You need to lose weight. Consider a lower calorie diet and regular exercise.  4)  Check your  Blood Pressure regularly. If it is above: 150/90 you should make an appointment. 5)  Please schedule a follow-up appointment as needed. Prescriptions: HYDROCODONE-ACETAMINOPHEN 5-500 MG TABS (HYDROCODONE-ACETAMINOPHEN) one every 6 hours as needed for pain  #50 x 2   Entered and Authorized by:   Gordy Savers  MD   Signed by:   Gordy Savers  MD on 01/28/2010   Method used:   Print then Give to Patient   RxID:   [id number]

## 2010-07-01 ENCOUNTER — Encounter (INDEPENDENT_AMBULATORY_CARE_PROVIDER_SITE_OTHER): Payer: Medicare Other

## 2010-07-01 DIAGNOSIS — I471 Supraventricular tachycardia: Secondary | ICD-10-CM

## 2010-07-01 NOTE — Assessment & Plan Note (Signed)
Summary: CPX/PT FASTING/CJR--rm 8   Vital Signs:  Patient profile:   45 year old male Height:      70 inches Weight:      350 pounds BMI:     50.40 Temp:     98.0 degrees F oral Pulse rate:   84 / minute Pulse rhythm:   regular Resp:     18 per minute BP sitting:   140 / 84  (left arm) Cuff size:   large  Vitals Entered By: Mervin Kung CMA [facility]ull) (June 11, 2010 8:44 AM) CC: Pt here for physical. Is Patient Diabetic? No Pain Assessment Patient in pain? no      Comments Pt states he is not taking any medications at present. Nicki Guadalajara Fergerson CMA [facility])  June 11, 2010 8:45 AM    Primary Care Provider:  Gordy Savers  MD  CC:  Pt here for physical..  History of Present Illness: 45 year old patient who is seen today for a health maintenance examination.  He was hospitalized in the spring on two occasions.  In March.  She was admitted for dysphasia, and underwent EDG which revealed an esophageal ring that required dilatation.  He was hospitalized the following month due to a wide complex tachycardia thought secondary to ventricular tachycardia.  He had a heart catheterization at that time, as well as a 2-D echocardiogram.  He was discharged on metoprolol, which he no longer takes.  Follow-up cardiology evaluation was not performed.  He denies any history of palpitations or syncope.    Here for Medicare AWV:  1.   Risk factors based on Past M, S, F history: risk factors include history of hypertension, morbid obesity, and family history.  He also has a history of impaired glucose tolerance 2.   Physical Activities: limited due to obesity 3.   Depression/mood: history mild depression in the past, but no recent treatment 4.   Hearing: no deficits 5.   ADL's: independent in  aspects of daily living 6.   Fall Risk: moderate due to obesity 7.   Home Safety: no problems identified 8.   Height, weight, &visual acuity:height and weight stable.  History of morbid obesity,  blind right eye 9.   Counseling: weight loss, more regular exercise, and heart healthy diet.  All discussed 10.   Labs ordered based on risk factors: will check a lipid profile, and hemoglobin A1c 11.           Referral Coordination- will schedule for follow-up cardiology 12.           Care Plan- will resume metoprolol 13.            Cognitive Assessment- alert and oriented normal affect.  No cognitive dysfunction     Allergies: 1)  ! Codeine  Past History:  Past Medical History: Hypertension Sarcoidosis    FOB 08/11/05 pos ncg with airway involvemnt, steroid dep since 3/07 Mobid obesity, wt 340 when started prednisone history glucose intolerance ocular sarcoid ( uveitis) Asthma Low back pain  right knee, DJD Headache depression  upper GI bleed , dysphagia secondary esopohageal ring March 2011 H. pylori gastritis h/o WCT 4-11  Past Surgical History: status post left cataract surgery cardiac cath 4-11 EGD 3-11  Family History: Reviewed history from 06/14/2007 and no changes required. father died age 62, history of cerebral  vascular disease; history of laryngeal cancer  Mother, age 79 has coronary artery disease ; DJD, status post pacemaker insertion  Five brothers, positive for  diabetes; one deceased, unknown causes  Social History: Reviewed history from 06/14/2007 and no changes required. Patient never smoked.  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  The patient denies anorexia, fever, weight loss, decreased hearing, hoarseness, chest pain, syncope, prolonged cough, hemoptysis, abdominal pain, melena, hematochezia, severe indigestion/heartburn,  hematuria, incontinence, genital sores, muscle weakness, suspicious skin lesions, transient blindness, unusual weight change, abnormal bleeding, enlarged lymph nodes, angioedema, breast masses, and testicular masses.    Physical Exam  General:  morbidly obese.  Blood pressure 124/80 Head:  Normocephalic and atraumatic without obvious abnormalities. No apparent alopecia or balding. Eyes:  right pupil, constricted and scarred Ears:  External ear exam shows no significant lesions or deformities.  Otoscopic examination reveals clear canals, tympanic membranes are intact bilaterally without bulging, retraction, inflammation or discharge. Hearing is grossly normal bilaterally. Nose:  External nasal examination shows no deformity or inflammation. Nasal mucosa are pink and moist without lesions or exudates. Mouth:  Oral mucosa and oropharynx without lesions or exudates.  Teeth in good repair. Chest Wall:  No deformities, masses, tenderness or gynecomastia noted. Breasts:  No masses or gynecomastia noted Lungs:  Normal respiratory effort, chest expands symmetrically. Lungs are clear to auscultation, no crackles or wheezes. Heart:  Normal rate and regular rhythm. S1 and S2 normal without gallop, murmur, click, rub or other extra sounds. Abdomen:  Bowel sounds positive,abdomen soft and non-tender without masses, organomegaly or hernias noted. Genitalia:  Testes bilaterally descended without nodularity, tenderness or masses. No scrotal masses or lesions. No penis lesions or urethral discharge. Msk:  No deformity or scoliosis noted of thoracic or lumbar spine.   Pulses:  pedal pulses intact Extremities:  chronic lower extremity nonpitting edema Neurologic:  alert & oriented X3, cranial nerves II-XII intact, strength normal in all extremities, sensation intact to light touch, sensation intact to pinprick, DTRs symmetrical and normal, and finger-to-nose normal.  alert & oriented X3, cranial nerves II-XII intact,  strength normal in all extremities, sensation intact to light touch, sensation intact to pinprick, DTRs symmetrical and normal, and finger-to-nose normal.   Skin:  Intact without suspicious lesions or rashes Cervical Nodes:  No lymphadenopathy noted Axillary Nodes:  No palpable lymphadenopathy Inguinal Nodes:  No significant adenopathy Psych:  Cognition and judgment appear intact. Alert and cooperative with normal attention span and concentration. No apparent delusions, illusions, hallucinations   Impression & Recommendations:  Problem # 1:  PREVENTIVE HEALTH CARE (ICD-V70.0)  Orders: Medicare -1st Annual Wellness Visit [phone]) TLB-PSA (Prostate Specific Antigen) (84153-PSA) Specimen Handling (60454)  Problem # 2:  VENTRICULAR TACHYCARDIA, PAROXYSMAL (ICD-427.1)  His updated medication list for this problem includes:    Metoprolol Succinate 50 Mg Xr24h-tab (Metoprolol succinate) ..... One daily  His updated medication list for this problem includes:    Metoprolol Succinate 50 Mg Xr24h-tab (Metoprolol succinate) ..... One daily  Problem # 3:  HYPERTENSION (ICD-401.9)  His updated medication list for this problem includes:    Metoprolol Succinate 50 Mg Xr24h-tab (Metoprolol succinate) ..... One daily  His updated medication list for this problem includes:    Metoprolol Succinate 50 Mg Xr24h-tab (Metoprolol succinate) ..... One daily  Problem # 4:  HYPERLIPIDEMIA (ICD-272.4)  Orders: TLB-Lipid Panel (  80061-LIPID)  Complete Medication List: 1)  Tramadol Hcl 50 Mg Tabs (Tramadol hcl) .... One every 6 hours for pain 2)  Hydrocodone-acetaminophen 5-500 Mg Tabs (Hydrocodone-acetaminophen) .... One every 6 hours as needed for pain 3)  Metoprolol Succinate 50 Mg Xr24h-tab (Metoprolol succinate) .... One daily  Other Orders: Flu Vaccine 68yrs + MEDICARE PATIENTS (W0981) Administration Flu vaccine - MCR (G0008) Venipuncture (19147) TLB-A1C / Hgb A1C (Glycohemoglobin)  (82956-O1H) Cardiology Referral (Cardiology)  Patient Instructions: 1)  Please schedule a follow-up appointment in 6 months. 2)  Limit your Sodium (Salt). 3)  It is important that you exercise regularly at least 20 minutes 5 times a week. If you develop chest pain, have severe difficulty breathing, or feel very tired , stop exercising immediately and seek medical attention. 4)  You need to lose weight. Consider a lower calorie diet and regular exercise.  5)  It is important that your Diabetic A1c level is checked every 3 months. 6)  cardiology follow-up in Prescriptions: METOPROLOL SUCCINATE 50 MG XR24H-TAB (METOPROLOL SUCCINATE) one daily  #90 x 4   Entered and Authorized by:   Gordy Savers  MD   Signed by:   Gordy Savers  MD on 06/11/2010   Method used:   Electronically to        [facility] [phone]* (retail)       [address]       [place], [place]  [address]       Ph: [phone]       Fax: [phone]   RxID:   [id number] TRAMADOL HCL 50 MG TABS (TRAMADOL HCL) one every 6 hours for pain  #50 x 3   Entered and Authorized by:   Gordy Savers  MD   Signed by:   Gordy Savers  MD on 06/11/2010   Method used:   Electronically to        [facility] [phone]* (retail)       [address]       [place], [place]  [address]       Ph: [phone]       Fax: [phone]   RxID:   [id number]    Orders Added: 1)  Flu Vaccine 13yrs + MEDICARE PATIENTS [Q2039] 2)  Administration Flu vaccine - MCR [G0008] 3)  Medicare -1st Annual Wellness Visit [G0438] 4)  Est. Patient Level III [76283] 5)  Venipuncture [36415] 6)  TLB-Lipid Panel [80061-LIPID] 7)  TLB-A1C / Hgb A1C (Glycohemoglobin) [83036-A1C] 8)  TLB-PSA (Prostate Specific Antigen) [15176-HYW] 9)  Cardiology Referral [Cardiology] 10)  Specimen Handling [99000]    Current Allergies (reviewed today): ! CODEINE   Flu Vaccine Consent Questions     Do you have a history of  severe allergic reactions to this vaccine? no    Any prior history of allergic reactions to egg and/or gelatin? no    Do you have a sensitivity to the preservative Thimersol? no    Do you have a past history of Guillan-Barre Syndrome? no    Do you currently have an acute febrile illness? no    Have you ever had a severe reaction to latex? no    Vaccine information given and explained to patient? yes    Are you currently pregnant? no    Lot Number:[id number]   Exp Date:11/27/2010   Site Given  Left Deltoid IM. Nicki Guadalajara Fergerson CMA Duncan Dull)  June 11, 2010 8:48 AM      Laboratory Results  Blood Tests    Date/Time Reported: Mervin Kung CMA (AAMA)  June 11, 2010 9:26 AM   Glucose (fasting): 71 mg/dL   (Normal Range: 04-540)

## 2010-07-01 NOTE — Assessment & Plan Note (Signed)
Summary: np6.wide complex tachycardia, medicare. gd  Medications Added METOPROLOL SUCCINATE 25 MG XR24H-TAB (METOPROLOL SUCCINATE) one by mouth two times a day SIMVASTATIN 20 MG TABS (SIMVASTATIN) one by mouth at bedtime ASPIRIN 81 MG TBEC (ASPIRIN) one po once daily        Visit Type:  Follow-up Primary Provider:  Gordy Savers  MD   History of Present Illness: Mr [name] is a morbidly obese 45 yo AAM with a h/o sarcoidosis, HTN, and preserved EF who presents today for EP follow-up after a hospitalization 4/11.  He presented at that time with abdominal pain and dizziness.  He apprahently was felt to have a wide complex tachycardia at that time, unfortunately no electrocardiogram was obtained.  The patient was evaluted by Dr Graciela Husbands.  The patient had telemetry strips which he reviewed and describes as below:  " Strips from telemetry with wide complex tachycardia were notable for artifacts.  The wide complex tachycardia that was identified in the emergency room is noted on one strip at time 9:59:03, which is   associated with sinus beat followed by a right bundle-branch block beat  that appears to have a morphology similar to aberration; however, there  is no antecedent P-wave.  There is some irregularity.  There are then  other beats in this strip with a different morphology similar to  previously seen PVCs. "  From this description, it is not clear as to whether the patient had sustained WCT or nonsustained tachycardia.  He underwent LHC which revealed nonobstructive CAD and preserved EF with LVH.  The patient reports doing reasonably well since that time.  He has been lost to cardiology follow-up.  He is presently taking no medicine, though he was previously recommended to take ASA and metoprolol.   He complains of occasional HA for which he follows with his PCP.  He denies recent CP, SOB, palpitations, dizziness, presyncope, or syncope.  He is otherwise without complaint  today.  Allergies: 1)  ! Codeine  Past History:  Past Medical History: Hypertension Sarcoidosis    FOB 08/11/05 pos ncg with airway involvemnt, steroid dep since 3/07 Mobid obesity, wt 340 when started prednisone history glucose intolerance ocular sarcoid ( uveitis) Asthma Low back pain  right knee, DJD Headache depression  upper GI bleed , dysphagia secondary esopohageal ring March 2011 H. pylori gastritis h/o WCT 4-11 (not well documented) nonobstructive CAD with LVH but preserved EF  Past Surgical History: Reviewed history from 06/11/2010 and no changes required. status post left cataract surgery cardiac cath 4-11 EGD 3-11  Family History: Reviewed history from 06/14/2007 and no changes required. father died age 2, history of cerebral  vascular disease; history of laryngeal cancer  Mother, age 69 has coronary artery disease ; DJD, status post pacemaker insertion  Five brothers, positive for diabetes; one deceased, unknown causes  Social History: Pt lives in [place].  Patient never smoked.  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   Vital Signs:  Patient profile:   45 year old male Height:      47 inches Weight:      392 pounds Pulse rate:   88 / minute Pulse rhythm:   regular BP sitting:   140 / 100  (left arm)  Vitals Entered By: Jacquelin Hawking, CMA (June 16, 2010 2:53 PM)  Physical Exam  General:  morbidly obese, NAD Head:  normocephalic and atraumatic Eyes:  PERRLA/EOM intact; conjunctiva and lids normal. Mouth:  Teeth, gums and palate normal. Oral mucosa normal. Neck:  supple Lungs:  Clear bilaterally to auscultation and percussion. Heart:  RRR, no m/r/g Abdomen:  Bowel sounds positive; abdomen soft and non-tender without masses, organomegaly, or hernias noted.  No hepatosplenomegaly. Msk:  Back normal, normal gait. Muscle strength and tone normal. Extremities:  No clubbing or cyanosis.  mild edema Neurologic:  Alert and oriented x 3. Skin:  Intact without lesions or rashes. Psych:  Normal affect.   Echocardiogram  Procedure date:  09/15/2009  Findings:       Study Conclusions    - Left ventricle: The cavity size was normal. There was mild     concentric hypertrophy. Systolic function was normal. The     estimated ejection fraction was in the range of 55% to 65%.     Doppler parameters are consistent with abnormal left ventricular     relaxation (grade 1 diastolic dysfunction).   - Atrial septum: No defect or patent foramen ovale was identified.   Transthoracic echocardiography. M-mode, complete 2D, spectral   Doppler, and color Doppler. Height: Height: 178cm. Height: 70.1in.   Weight: Weight: 178kg. Weight: 391.6lb. Body mass index: BMI:   56.2kg/m^2. Body surface area: BSA: 3.33m^2. Patient status:   Inpatient. Location: Echo laboratory.  Left atrium                        Peak              3 mm Hg -------   AP dim           42 mm     ------  gradient, D   AP dim index   1.37 cm/m^2 <2.2    Peak E/A        0.8       -------                                      ratio    --------------------------------------------------------------------   Prepared and Electronically Authenticated by    Rinaldo Cloud, MD    EKG  Procedure date:  06/16/2010  Findings:      sinus rhythm 88 bpm, PR 178, QRS 86, Qtc 425, otherwise normal ekg  Cardiac Cath  Procedure date:  09/18/2009  Findings:      LV showed good LV systolic function, LVH, EF of 16-10%.  Left   main was short which was patent.  LAD has 20-25% proximal eccentric   stenosis.  Diagonal 1 is small which is patent.  Diagonal 2 and 3 were   very small which were patent.  Ramus was very small which was patent.   Left circumflex was large which was patent.   OM-1 to OM-3 were  moderate sized which were patent.  PDA was patent.   RCA is small, nondominant which was patent.   Impression & Recommendations:  Problem # 1:  VENTRICULAR TACHYCARDIA, PAROXYSMAL (ICD-427.1) The patient presents today for cardiology follow-up.  He was seen by Dr Graciela Husbands at [facility] 4/11.  He apparhently had some sort of wide complex arrhythmia in the ER, though it is not well docuented.  I have only Dr Odessa Fleming description from his consult note.  From this,  I am not certain as to whether the patient had sustained or nonsustained arrhythmia and whether or not it was SVT or VT. He has a preserved ejection fraction and no recent sypmtoms of arrhythmia.  His echo revealed no evidence of sarcoid involvement and his EKG at this time is normal. We will place 48 hour holter monitor and evaluate for arrhyhthmias.  I would recommend low dose beta blocker longterm.  If his holter monitor is low risk, no further testing is planned.  Though typically with sarcoidosis, we would like to obtain a cardiac MRI to evaluate for heart envolvement, Mr Nee is far too large to fit into our MRI scanner and there is not a candidate for this test.  Problem # 2:  CAD (ICD-414.00) The patient has minor nonobstructive CAD by cath 4/11.  I would recommend ASA 81mg  daily as well as initiation of statin therapy to prevent disease progression.  Lipids 1/12 are reviewed and are above goal.  LFTs 8/11 were normal.  Problem # 3:  MORBID OBESITY (ICD-278.01) weight loss advised  Problem # 4:  HYPERLIPIDEMIA (ICD-272.4) start simvastatin as above  His updated medication list for this problem includes:    Simvastatin 20 Mg Tabs (Simvastatin) ..... One by mouth at bedtime  Problem # 5:  HYPERTENSION (ICD-401.9) stable add metoprolol  His updated medication list for this problem includes:    Metoprolol Succinate 25 Mg Xr24h-tab (Metoprolol succinate) ..... One by mouth two times a day    Aspirin 81 Mg Tbec (Aspirin) .....  One po once daily  Problem # 6:  Hx of SARCOIDOSIS (ICD-135) he is previously described as steroid dependant but does not take streroids now I am concerned about the poor insight that he seems to have into his desease process.  He was previously followed by Dr Sherene Sires  for pulmonary issues.  He may benefit from further evaluation with Dr Sherene Sires, however I will defer this decision to Dr Amador Cunas.  Other Orders: EKG w/ Interpretation (93000) Holter Monitor (Holter Monitor)  Patient Instructions: 1)  Your physician recommends that you schedule a follow-up appointment in: 3 months with Dr Johney Frame 2)  Your physician has recommended you make the following change in your medication:  3)  start Aspirin 81mg  daily 4)  start Metoprlol 25mg  two times a day 5)  start Simvastatin 20mg  at bedtime 6)  Your physician has recommended that you wear a holter monitor.  Holter monitors are medical devices that record the heart's electrical activity. Doctors most often use these monitors to diagnose arrhythmias. Arrhythmias are problems with the speed or rhythm of the heartbeat. The monitor is a small, portable device. You can wear one while you do your normal daily activities. This is usually used to diagnose what is causing palpitations/syncope (passing out). Prescriptions: SIMVASTATIN 20 MG TABS (SIMVASTATIN) one by mouth at bedtime  #30 x 11   Entered by:   Dennis Bast, RN, BSN   Authorized by:   Hillis Range, MD   Signed by:   Dennis Bast, RN, BSN on 06/16/2010   Method used:   Electronically to        [facility] [phone]* (retail)       [address]       [place], [place]  [address]       Ph: [phone]       Fax: [phone]   RxID:   [id number] METOPROLOL SUCCINATE 25 MG XR24H-TAB (METOPROLOL SUCCINATE) one by mouth two times a  day  #60 x 11   Entered by:   Dennis Bast, RN, BSN   Authorized by:   Hillis Range, MD   Signed by:   Dennis Bast, RN, BSN on 06/16/2010   Method used:    Electronically to        [facility] [phone]* (retail)       [address]       [place], [place]  [address]       Ph: [phone]       Fax: [phone]   RxID:   [id number]

## 2010-07-01 NOTE — Progress Notes (Signed)
Summary: pt req samples of metoprolol   Phone Note Call from Patient   Caller: Patient [phone] Reason for Call: Talk to Nurse Summary of Call: pt calling requesting samples of metoprolol Initial call taken by: Glynda Jaeger,  June 17, 2010 2:33 PM  Follow-up for Phone Call        pt notified that this office does not have samples of metoprolol. Follow-up by: Laurance Flatten CMA,  June 18, 2010 2:18 PM

## 2010-07-07 NOTE — Progress Notes (Signed)
Summary: Holter Monitor   Phone Note Outgoing Call Call back at [facility] Phone [phone]   Call placed by: Marcos Eke,  June 18, 2010 10:23 AM Summary of Call: Call patient  and left messege to call back to sch. appt for monitoe  Follow-up for Phone Call        patient has appt. sch for 2/2 @10 :30 Follow-up by: Marcos Eke,  June 30, 2010 9:17 AM

## 2010-07-08 ENCOUNTER — Emergency Department (HOSPITAL_COMMUNITY)
Admission: EM | Admit: 2010-07-08 | Discharge: 2010-07-09 | Disposition: A | Discharge status: Home / Self Care | Payer: Medicare Other | Attending: Emergency Medicine | Admitting: Emergency Medicine

## 2010-07-08 ENCOUNTER — Emergency Department (HOSPITAL_COMMUNITY): Payer: Medicare Other

## 2010-07-08 DIAGNOSIS — R0602 Shortness of breath: Secondary | ICD-10-CM | POA: Insufficient documentation

## 2010-07-08 DIAGNOSIS — I10 Essential (primary) hypertension: Secondary | ICD-10-CM | POA: Insufficient documentation

## 2010-07-08 DIAGNOSIS — R072 Precordial pain: Secondary | ICD-10-CM | POA: Insufficient documentation

## 2010-07-08 DIAGNOSIS — D869 Sarcoidosis, unspecified: Secondary | ICD-10-CM | POA: Insufficient documentation

## 2010-07-08 DIAGNOSIS — E669 Obesity, unspecified: Secondary | ICD-10-CM | POA: Insufficient documentation

## 2010-07-08 DIAGNOSIS — I4949 Other premature depolarization: Secondary | ICD-10-CM | POA: Insufficient documentation

## 2010-07-08 LAB — BASIC METABOLIC PANEL
Calcium: 9.3 mg/dL (ref 8.4–10.5)
Chloride: 101 mEq/L (ref 96–112)
Creatinine, Ser: 1.05 mg/dL (ref 0.4–1.5)
GFR calc Af Amer: >60 mL/min (ref >60)
GFR calc non Af Amer: >60 mL/min (ref >60)

## 2010-07-08 LAB — POCT CARDIAC MARKERS
CKMB, poc: <1 ng/mL — ABNORMAL LOW (ref 1.0–8.0)
Myoglobin, poc: 71 ng/mL (ref 12–200)

## 2010-07-08 LAB — RAPID URINE DRUG SCREEN, HOSP PERFORMED
Amphetamines: NOT DETECTED
Cocaine: NOT DETECTED
Tetrahydrocannabinol: NOT DETECTED

## 2010-08-11 LAB — BASIC METABOLIC PANEL
BUN: 13 mg/dL (ref 6–23)
CO2: 31 mEq/L (ref 19–32)
Calcium: 9.3 mg/dL (ref 8.4–10.5)
Chloride: 104 mEq/L (ref 96–112)
Creatinine, Ser: 1.14 mg/dL (ref 0.4–1.5)
Glucose, Bld: 103 mg/dL — ABNORMAL HIGH (ref 70–99)

## 2010-08-11 LAB — CBC
MCH: 30.4 pg (ref 26.0–34.0)
MCHC: 32.2 g/dL (ref 30.0–36.0)
MCV: 94.5 fL (ref 78.0–100.0)
Platelets: 170 10*3/uL (ref 150–400)

## 2010-08-11 LAB — POCT CARDIAC MARKERS: Myoglobin, poc: 87.4 ng/mL (ref 12–200)

## 2010-08-11 LAB — DIFFERENTIAL
Basophils Relative: 0 % (ref 0–1)
Eosinophils Absolute: 0.3 10*3/uL (ref 0.0–0.7)
Eosinophils Relative: 3 % (ref 0–5)
Neutrophils Relative %: 57 % (ref 43–77)

## 2010-08-11 LAB — D-DIMER, QUANTITATIVE: D-Dimer, Quant: 0.61 ug/mL-FEU — ABNORMAL HIGH (ref 0.00–0.48)

## 2010-08-12 LAB — COMPREHENSIVE METABOLIC PANEL
ALT: 14 U/L (ref 0–53)
AST: 15 U/L (ref 0–37)
Alkaline Phosphatase: 68 U/L (ref 39–117)
CO2: 31 mEq/L (ref 19–32)
Chloride: 107 mEq/L (ref 96–112)
GFR calc Af Amer: >60 mL/min (ref >60)
GFR calc non Af Amer: >60 mL/min (ref >60)
Glucose, Bld: 101 mg/dL — ABNORMAL HIGH (ref 70–99)
Sodium: 141 mEq/L (ref 135–145)
Total Bilirubin: 0.7 mg/dL (ref 0.3–1.2)

## 2010-08-12 LAB — ACETAMINOPHEN LEVEL: Acetaminophen (Tylenol), Serum: <10 ug/mL — ABNORMAL LOW (ref 10–30)

## 2010-08-14 LAB — URINALYSIS, ROUTINE W REFLEX MICROSCOPIC
Bilirubin Urine: NEGATIVE
Ketones, ur: NEGATIVE mg/dL
Nitrite: NEGATIVE
Protein, ur: NEGATIVE mg/dL
Urobilinogen, UA: 0.2 mg/dL (ref 0.0–1.0)

## 2010-08-14 LAB — URINE CULTURE: Colony Count: NO GROWTH

## 2010-08-17 LAB — COMPREHENSIVE METABOLIC PANEL
AST: 17 U/L (ref 0–37)
AST: 26 U/L (ref 0–37)
Albumin: 3.3 g/dL — ABNORMAL LOW (ref 3.5–5.2)
Albumin: 3.5 g/dL (ref 3.5–5.2)
Alkaline Phosphatase: 56 U/L (ref 39–117)
Alkaline Phosphatase: 59 U/L (ref 39–117)
BUN: 12 mg/dL (ref 6–23)
BUN: 14 mg/dL (ref 6–23)
CO2: 29 mEq/L (ref 19–32)
CO2: 30 mEq/L (ref 19–32)
Calcium: 9.2 mg/dL (ref 8.4–10.5)
Chloride: 103 mEq/L (ref 96–112)
Chloride: 104 mEq/L (ref 96–112)
Creatinine, Ser: 1.13 mg/dL (ref 0.4–1.5)
GFR calc Af Amer: >60 mL/min (ref >60)
GFR calc non Af Amer: >60 mL/min (ref >60)
GFR calc non Af Amer: >60 mL/min (ref >60)
Glucose, Bld: 108 mg/dL — ABNORMAL HIGH (ref 70–99)
Potassium: 4 mEq/L (ref 3.5–5.1)
Potassium: 4.1 mEq/L (ref 3.5–5.1)
Potassium: 4.2 mEq/L (ref 3.5–5.1)
Total Bilirubin: 0.7 mg/dL (ref 0.3–1.2)
Total Bilirubin: 0.8 mg/dL (ref 0.3–1.2)
Total Protein: 8.2 g/dL (ref 6.0–8.3)

## 2010-08-17 LAB — POCT CARDIAC MARKERS
CKMB, poc: <1 ng/mL — ABNORMAL LOW (ref 1.0–8.0)
CKMB, poc: <1 ng/mL — ABNORMAL LOW (ref 1.0–8.0)
Myoglobin, poc: 106 ng/mL (ref 12–200)
Myoglobin, poc: 93.9 ng/mL (ref 12–200)
Troponin i, poc: <0.05 ng/mL (ref 0.00–0.09)
Troponin i, poc: <0.05 ng/mL (ref 0.00–0.09)

## 2010-08-17 LAB — CBC
HCT: 40 % (ref 39.0–52.0)
HCT: 40.1 % (ref 39.0–52.0)
HCT: 41.2 % (ref 39.0–52.0)
Hemoglobin: 13.4 g/dL (ref 13.0–17.0)
MCHC: 32.8 g/dL (ref 30.0–36.0)
MCHC: 33.5 g/dL (ref 30.0–36.0)
MCV: 93.6 fL (ref 78.0–100.0)
MCV: 94.6 fL (ref 78.0–100.0)
Platelets: 127 10*3/uL — ABNORMAL LOW (ref 150–400)
Platelets: 138 10*3/uL — ABNORMAL LOW (ref 150–400)
Platelets: 141 10*3/uL — ABNORMAL LOW (ref 150–400)
RBC: 4.26 MIL/uL (ref 4.22–5.81)
RBC: 4.31 MIL/uL (ref 4.22–5.81)
RDW: 12.7 % (ref 11.5–15.5)
RDW: 12.9 % (ref 11.5–15.5)
WBC: 7.1 10*3/uL (ref 4.0–10.5)
WBC: 8.1 10*3/uL (ref 4.0–10.5)

## 2010-08-17 LAB — DIFFERENTIAL
Basophils Absolute: 0 10*3/uL (ref 0.0–0.1)
Basophils Absolute: 0 10*3/uL (ref 0.0–0.1)
Basophils Relative: 0 % (ref 0–1)
Basophils Relative: 2 % — ABNORMAL HIGH (ref 0–1)
Eosinophils Absolute: 0.3 10*3/uL (ref 0.0–0.7)
Eosinophils Relative: 5 % (ref 0–5)
Eosinophils Relative: 5 % (ref 0–5)
Lymphocytes Relative: 14 % (ref 12–46)
Monocytes Absolute: 0.6 10*3/uL (ref 0.1–1.0)
Monocytes Absolute: 0.7 10*3/uL (ref 0.1–1.0)
Monocytes Relative: 11 % (ref 3–12)
Neutro Abs: 4.8 10*3/uL (ref 1.7–7.7)
Neutro Abs: 5.7 10*3/uL (ref 1.7–7.7)
Neutrophils Relative %: 61 % (ref 43–77)

## 2010-08-17 LAB — URINALYSIS, ROUTINE W REFLEX MICROSCOPIC
Bilirubin Urine: NEGATIVE
Bilirubin Urine: NEGATIVE
Glucose, UA: NEGATIVE mg/dL
Glucose, UA: NEGATIVE mg/dL
Hgb urine dipstick: NEGATIVE
Ketones, ur: NEGATIVE mg/dL
Ketones, ur: NEGATIVE mg/dL
Nitrite: NEGATIVE
Protein, ur: NEGATIVE mg/dL
Protein, ur: NEGATIVE mg/dL
Specific Gravity, Urine: 1.016 (ref 1.005–1.030)
Urobilinogen, UA: 1 mg/dL (ref 0.0–1.0)
pH: 6 (ref 5.0–8.0)
pH: 6.5 (ref 5.0–8.0)

## 2010-08-17 LAB — BASIC METABOLIC PANEL
BUN: 10 mg/dL (ref 6–23)
CO2: 32 mEq/L (ref 19–32)
Chloride: 99 mEq/L (ref 96–112)
GFR calc non Af Amer: >60 mL/min (ref >60)
Glucose, Bld: 85 mg/dL (ref 70–99)
Potassium: 4.3 mEq/L (ref 3.5–5.1)

## 2010-08-17 LAB — GLUCOSE, CAPILLARY: Glucose-Capillary: 102 mg/dL — ABNORMAL HIGH (ref 70–99)

## 2010-08-17 LAB — CARDIAC PANEL(CRET KIN+CKTOT+MB+TROPI)
CK, MB: 0.8 ng/mL (ref 0.3–4.0)
Relative Index: 0.8 (ref 0.0–2.5)
Total CK: 103 U/L (ref 7–232)
Troponin I: 0.01 ng/mL (ref 0.00–0.06)

## 2010-08-20 ENCOUNTER — Emergency Department (HOSPITAL_COMMUNITY)
Admission: EM | Admit: 2010-08-20 | Discharge: 2010-08-20 | Disposition: A | Discharge status: Home / Self Care | Payer: Medicare Other | Attending: Emergency Medicine | Admitting: Emergency Medicine

## 2010-08-20 DIAGNOSIS — I10 Essential (primary) hypertension: Secondary | ICD-10-CM | POA: Insufficient documentation

## 2010-08-20 DIAGNOSIS — Z7982 Long term (current) use of aspirin: Secondary | ICD-10-CM | POA: Insufficient documentation

## 2010-08-20 DIAGNOSIS — D869 Sarcoidosis, unspecified: Secondary | ICD-10-CM | POA: Insufficient documentation

## 2010-08-20 DIAGNOSIS — Z79899 Other long term (current) drug therapy: Secondary | ICD-10-CM | POA: Insufficient documentation

## 2010-08-20 DIAGNOSIS — R51 Headache: Secondary | ICD-10-CM | POA: Insufficient documentation

## 2010-08-22 LAB — DIFFERENTIAL
Basophils Absolute: 0 10*3/uL (ref 0.0–0.1)
Basophils Relative: 1 % (ref 0–1)
Eosinophils Absolute: 0.2 10*3/uL (ref 0.0–0.7)
Eosinophils Absolute: 0.2 10*3/uL (ref 0.0–0.7)
Eosinophils Relative: 2 % (ref 0–5)
Eosinophils Relative: 3 % (ref 0–5)
Eosinophils Relative: 3 % (ref 0–5)
Lymphocytes Relative: 17 % (ref 12–46)
Lymphs Abs: 1.6 10*3/uL (ref 0.7–4.0)
Lymphs Abs: 1.7 10*3/uL (ref 0.7–4.0)
Monocytes Absolute: 0.6 10*3/uL (ref 0.1–1.0)
Monocytes Absolute: 0.9 10*3/uL (ref 0.1–1.0)
Monocytes Relative: 11 % (ref 3–12)
Monocytes Relative: 13 % — ABNORMAL HIGH (ref 3–12)
Neutrophils Relative %: 65 % (ref 43–77)

## 2010-08-22 LAB — POCT CARDIAC MARKERS
CKMB, poc: <1 ng/mL — ABNORMAL LOW (ref 1.0–8.0)
CKMB, poc: <1 ng/mL — ABNORMAL LOW (ref 1.0–8.0)
CKMB, poc: <1 ng/mL — ABNORMAL LOW (ref 1.0–8.0)
Myoglobin, poc: 101 ng/mL (ref 12–200)
Myoglobin, poc: 129 ng/mL (ref 12–200)
Troponin i, poc: <0.05 ng/mL (ref 0.00–0.09)
Troponin i, poc: <0.05 ng/mL (ref 0.00–0.09)
Troponin i, poc: <0.05 ng/mL (ref 0.00–0.09)

## 2010-08-22 LAB — CBC
HCT: 36.5 % — ABNORMAL LOW (ref 39.0–52.0)
HCT: 36.7 % — ABNORMAL LOW (ref 39.0–52.0)
HCT: 37.7 % — ABNORMAL LOW (ref 39.0–52.0)
HCT: 41.3 % (ref 39.0–52.0)
Hemoglobin: 12.3 g/dL — ABNORMAL LOW (ref 13.0–17.0)
Hemoglobin: 12.4 g/dL — ABNORMAL LOW (ref 13.0–17.0)
Hemoglobin: 12.5 g/dL — ABNORMAL LOW (ref 13.0–17.0)
Hemoglobin: 12.7 g/dL — ABNORMAL LOW (ref 13.0–17.0)
Hemoglobin: 13.6 g/dL (ref 13.0–17.0)
Hemoglobin: 13.9 g/dL (ref 13.0–17.0)
MCHC: 33 g/dL (ref 30.0–36.0)
MCHC: 33 g/dL (ref 30.0–36.0)
MCHC: 33.4 g/dL (ref 30.0–36.0)
MCHC: 33.5 g/dL (ref 30.0–36.0)
MCHC: 33.5 g/dL (ref 30.0–36.0)
MCV: 92.8 fL (ref 78.0–100.0)
MCV: 93.4 fL (ref 78.0–100.0)
MCV: 93.4 fL (ref 78.0–100.0)
MCV: 94.2 fL (ref 78.0–100.0)
MCV: 94.3 fL (ref 78.0–100.0)
Platelets: 142 10*3/uL — ABNORMAL LOW (ref 150–400)
Platelets: 142 10*3/uL — ABNORMAL LOW (ref 150–400)
Platelets: 146 10*3/uL — ABNORMAL LOW (ref 150–400)
Platelets: 153 10*3/uL (ref 150–400)
Platelets: 156 10*3/uL (ref 150–400)
RBC: 3.91 MIL/uL — ABNORMAL LOW (ref 4.22–5.81)
RBC: 3.93 MIL/uL — ABNORMAL LOW (ref 4.22–5.81)
RBC: 4.09 MIL/uL — ABNORMAL LOW (ref 4.22–5.81)
RBC: 4.12 MIL/uL — ABNORMAL LOW (ref 4.22–5.81)
RBC: 4.16 MIL/uL — ABNORMAL LOW (ref 4.22–5.81)
RDW: 12.3 % (ref 11.5–15.5)
RDW: 12.4 % (ref 11.5–15.5)
RDW: 12.6 % (ref 11.5–15.5)
WBC: 7.1 10*3/uL (ref 4.0–10.5)
WBC: 7.5 10*3/uL (ref 4.0–10.5)
WBC: 7.5 10*3/uL (ref 4.0–10.5)
WBC: 7.6 10*3/uL (ref 4.0–10.5)
WBC: 8 10*3/uL (ref 4.0–10.5)
WBC: 8.3 10*3/uL (ref 4.0–10.5)

## 2010-08-22 LAB — LIPASE, BLOOD: Lipase: 14 U/L (ref 11–59)

## 2010-08-22 LAB — GLUCOSE, CAPILLARY
Glucose-Capillary: 103 mg/dL — ABNORMAL HIGH (ref 70–99)
Glucose-Capillary: 120 mg/dL — ABNORMAL HIGH (ref 70–99)
Glucose-Capillary: 130 mg/dL — ABNORMAL HIGH (ref 70–99)
Glucose-Capillary: 71 mg/dL (ref 70–99)
Glucose-Capillary: 71 mg/dL (ref 70–99)
Glucose-Capillary: 74 mg/dL (ref 70–99)
Glucose-Capillary: 80 mg/dL (ref 70–99)
Glucose-Capillary: 91 mg/dL (ref 70–99)

## 2010-08-22 LAB — BASIC METABOLIC PANEL
BUN: 9 mg/dL (ref 6–23)
CO2: 29 mEq/L (ref 19–32)
CO2: 32 mEq/L (ref 19–32)
Calcium: 8.6 mg/dL (ref 8.4–10.5)
Calcium: 8.8 mg/dL (ref 8.4–10.5)
Chloride: 101 mEq/L (ref 96–112)
Creatinine, Ser: 1.11 mg/dL (ref 0.4–1.5)
GFR calc Af Amer: >60 mL/min (ref >60)
GFR calc non Af Amer: >60 mL/min (ref >60)
GFR calc non Af Amer: >60 mL/min (ref >60)
Glucose, Bld: 154 mg/dL — ABNORMAL HIGH (ref 70–99)
Glucose, Bld: 87 mg/dL (ref 70–99)
Potassium: 4.1 mEq/L (ref 3.5–5.1)
Potassium: 4.3 mEq/L (ref 3.5–5.1)
Sodium: 138 mEq/L (ref 135–145)
Sodium: 140 mEq/L (ref 135–145)

## 2010-08-22 LAB — CARDIAC PANEL(CRET KIN+CKTOT+MB+TROPI)
CK, MB: 3.7 ng/mL (ref 0.3–4.0)
Relative Index: 0.8 (ref 0.0–2.5)
Total CK: 454 U/L — ABNORMAL HIGH (ref 7–232)
Troponin I: <0.01 ng/mL (ref 0.00–0.06)

## 2010-08-22 LAB — CK TOTAL AND CKMB (NOT AT ARMC)
CK, MB: 1 ng/mL (ref 0.3–4.0)
Relative Index: 0.5 (ref 0.0–2.5)
Total CK: 187 U/L (ref 7–232)

## 2010-08-22 LAB — PROTIME-INR
INR: 1.02 (ref 0.00–1.49)
Prothrombin Time: 12.7 seconds (ref 11.6–15.2)
Prothrombin Time: 13.3 seconds (ref 11.6–15.2)

## 2010-08-22 LAB — CROSSMATCH: Antibody Screen: NEGATIVE

## 2010-08-22 LAB — LIPID PANEL
Cholesterol: 189 mg/dL (ref 0–200)
LDL Cholesterol: 141 mg/dL — ABNORMAL HIGH (ref 0–99)
Total CHOL/HDL Ratio: 5.3 RATIO
Triglycerides: 59 mg/dL (ref <150)
VLDL: 12 mg/dL (ref 0–40)

## 2010-08-22 LAB — TROPONIN I: Troponin I: 0.01 ng/mL (ref 0.00–0.06)

## 2010-08-22 LAB — COMPREHENSIVE METABOLIC PANEL
ALT: 15 U/L (ref 0–53)
ALT: 16 U/L (ref 0–53)
AST: 15 U/L (ref 0–37)
AST: 20 U/L (ref 0–37)
Albumin: 3.4 g/dL — ABNORMAL LOW (ref 3.5–5.2)
Albumin: 3.4 g/dL — ABNORMAL LOW (ref 3.5–5.2)
Alkaline Phosphatase: 66 U/L (ref 39–117)
Calcium: 8.9 mg/dL (ref 8.4–10.5)
Calcium: 9 mg/dL (ref 8.4–10.5)
GFR calc Af Amer: >60 mL/min (ref >60)
GFR calc Af Amer: >60 mL/min (ref >60)
Glucose, Bld: 116 mg/dL — ABNORMAL HIGH (ref 70–99)
Potassium: 4.1 mEq/L (ref 3.5–5.1)
Sodium: 136 mEq/L (ref 135–145)
Sodium: 138 mEq/L (ref 135–145)
Total Protein: 7.5 g/dL (ref 6.0–8.3)
Total Protein: 8.3 g/dL (ref 6.0–8.3)

## 2010-08-22 LAB — RAPID URINE DRUG SCREEN, HOSP PERFORMED
Benzodiazepines: NOT DETECTED
Tetrahydrocannabinol: NOT DETECTED

## 2010-08-22 LAB — HEPATIC FUNCTION PANEL
AST: 20 U/L (ref 0–37)
Albumin: 3.6 g/dL (ref 3.5–5.2)
Total Bilirubin: 0.8 mg/dL (ref 0.3–1.2)

## 2010-08-22 LAB — POCT I-STAT, CHEM 8
BUN: 16 mg/dL (ref 6–23)
Chloride: 102 mEq/L (ref 96–112)
Creatinine, Ser: 1.2 mg/dL (ref 0.4–1.5)
Glucose, Bld: 114 mg/dL — ABNORMAL HIGH (ref 70–99)
Hemoglobin: 15.3 g/dL (ref 13.0–17.0)
Potassium: 4.1 mEq/L (ref 3.5–5.1)
Sodium: 140 mEq/L (ref 135–145)

## 2010-08-22 LAB — MRSA PCR SCREENING: MRSA by PCR: NEGATIVE

## 2010-08-22 LAB — TSH: TSH: 1.485 u[IU]/mL (ref 0.350–4.500)

## 2010-08-22 LAB — PHOSPHORUS: Phosphorus: 4.4 mg/dL (ref 2.3–4.6)

## 2010-08-22 LAB — HEMOGLOBIN A1C: Mean Plasma Glucose: 105 mg/dL

## 2010-08-22 LAB — AMYLASE: Amylase: 42 U/L (ref 0–105)

## 2010-09-05 LAB — RAPID URINE DRUG SCREEN, HOSP PERFORMED
Amphetamines: NOT DETECTED
Barbiturates: NOT DETECTED
Benzodiazepines: NOT DETECTED
Cocaine: NOT DETECTED
Opiates: NOT DETECTED
Tetrahydrocannabinol: NOT DETECTED

## 2010-09-05 LAB — BASIC METABOLIC PANEL WITH GFR
Chloride: 103 meq/L (ref 96–112)
GFR calc non Af Amer: >60 mL/min (ref >60)
Glucose, Bld: 107 mg/dL — ABNORMAL HIGH (ref 70–99)
Potassium: 4.5 meq/L (ref 3.5–5.1)
Sodium: 139 meq/L (ref 135–145)

## 2010-09-05 LAB — DIFFERENTIAL
Basophils Absolute: 0 10*3/uL (ref 0.0–0.1)
Basophils Relative: 0 % (ref 0–1)
Eosinophils Absolute: 0.1 10*3/uL (ref 0.0–0.7)
Eosinophils Relative: 1 % (ref 0–5)
Lymphocytes Relative: 10 % — ABNORMAL LOW (ref 12–46)
Lymphs Abs: 1.1 K/uL (ref 0.7–4.0)
Monocytes Absolute: 0.8 K/uL (ref 0.1–1.0)
Monocytes Relative: 7 % (ref 3–12)
Neutro Abs: 8.6 10*3/uL — ABNORMAL HIGH (ref 1.7–7.7)
Neutrophils Relative %: 82 % — ABNORMAL HIGH (ref 43–77)

## 2010-09-05 LAB — CBC
HCT: 42.5 % (ref 39.0–52.0)
Hemoglobin: 13.9 g/dL (ref 13.0–17.0)
MCHC: 32.7 g/dL (ref 30.0–36.0)
MCV: 93.1 fL (ref 78.0–100.0)
Platelets: 163 10*3/uL (ref 150–400)
RBC: 4.56 MIL/uL (ref 4.22–5.81)
RDW: 12.9 % (ref 11.5–15.5)
WBC: 10.5 K/uL (ref 4.0–10.5)

## 2010-09-05 LAB — BASIC METABOLIC PANEL
BUN: 13 mg/dL (ref 6–23)
CO2: 29 mEq/L (ref 19–32)
Calcium: 9.3 mg/dL (ref 8.4–10.5)
Creatinine, Ser: 1.22 mg/dL (ref 0.4–1.5)
GFR calc Af Amer: >60 mL/min (ref >60)

## 2010-09-05 LAB — URINE MICROSCOPIC-ADD ON

## 2010-09-05 LAB — URINALYSIS, ROUTINE W REFLEX MICROSCOPIC
Bilirubin Urine: NEGATIVE
Glucose, UA: NEGATIVE mg/dL
Hgb urine dipstick: NEGATIVE
Nitrite: NEGATIVE
Protein, ur: NEGATIVE mg/dL
Specific Gravity, Urine: 1.026 (ref 1.005–1.030)
Urobilinogen, UA: 1 mg/dL (ref 0.0–1.0)
pH: 6.5 (ref 5.0–8.0)

## 2010-09-05 LAB — ETHANOL: Alcohol, Ethyl (B): <5 mg/dL (ref 0–10)

## 2010-09-21 ENCOUNTER — Encounter: Payer: Self-pay | Admitting: Internal Medicine

## 2010-09-22 ENCOUNTER — Ambulatory Visit: Payer: Medicare Other | Admitting: Internal Medicine

## 2010-10-12 ENCOUNTER — Ambulatory Visit (INDEPENDENT_AMBULATORY_CARE_PROVIDER_SITE_OTHER): Payer: Medicare Other | Admitting: Internal Medicine

## 2010-10-12 ENCOUNTER — Encounter: Payer: Self-pay | Admitting: Internal Medicine

## 2010-10-12 DIAGNOSIS — I10 Essential (primary) hypertension: Secondary | ICD-10-CM

## 2010-10-12 DIAGNOSIS — F341 Dysthymic disorder: Secondary | ICD-10-CM

## 2010-10-12 DIAGNOSIS — I472 Ventricular tachycardia: Secondary | ICD-10-CM

## 2010-10-12 DIAGNOSIS — R51 Headache: Secondary | ICD-10-CM

## 2010-10-12 DIAGNOSIS — E785 Hyperlipidemia, unspecified: Secondary | ICD-10-CM

## 2010-10-12 MED ORDER — METOPROLOL SUCCINATE ER 50 MG PO TB24: 50.0000 mg | ORAL_TABLET | Freq: Every day | ORAL | Status: DC

## 2010-10-12 MED ORDER — TRAMADOL HCL 50 MG PO TABS: 50.0000 mg | ORAL_TABLET | Freq: Four times a day (QID) | ORAL | Status: DC | PRN

## 2010-10-12 MED ORDER — SIMVASTATIN 20 MG PO TABS: 20.0000 mg | ORAL_TABLET | Freq: Every day | ORAL | Status: DC

## 2010-10-12 NOTE — Assessment & Plan Note (Signed)
[facility]                             PULMONARY OFFICE NOTE   NAME:[name], [name]                   MRN:          [id number]  DATE:03/07/2007                            DOB:          Oct 18, 1965    PULMONARY EXTENDED FOLLOWUP OFFICE VISIT:   HISTORY:  A 45 year old white male with definite evidence of sarcoidosis  involving airways by bronchoscopy 2007 and placed on prednisone March  2007.  He ran out of the prednisone and failed to return for followup 5  months ago.  He thinks he took his last dose of prednisone about 2  months ago and now for the last month, he has been having increasing  cough, congestion.  He denies any dyspnea albeit at a very sedentary  lifestyle.  He is undergoing eye evaluation for floaters,  but does  not know the name of his eye doctor.  He denies any photophobia.  He  denies any change in skin rash that was presumed previously due to  sarcoid that either improved on prednisone or worsened off of  prednisone.  He also denies any worsening myalgias, arthralgias, fevers,  chills, sweats.  His main symptom is now and was before dry coughing.   PHYSICAL EXAMINATION:  GENERAL:  He is a pleasant, ambulatory, obese,  black male in no acute distress.  VITAL SIGNS:  He is afebrile with normal vital signs.  HEENT:  Unremarkable.  Pharynx is clear.  NECK:  Supple without cervical adenopathy or tenderness.  Trachea is  midline.  No thyromegaly.  LUNGS:  Fields reveal inspiratory and expiratory rhonchi, right greater  than left.  Air movement diminished.  HEART:  He has a regular rhythm without murmur, gallop, or rub.  ABDOMEN:  Soft, benign.  EXTREMITIES:  Without calf tenderness, cyanosis, clubbing, edema.  SKIN:  I thought he had extensive rash over both extensor surface of his  lower extremities and also over his face consistent with sarcoid.   IMPRESSION:  1. Sarcoidosis that at least is effecting the airways if not the  lung      parenchyma, eyes, and skin.  In this setting, I do not think it is      wise for him to taper himself off prednisone without letting us      help him.  I have therefore recommended the following restart      prednisone at 20 mg per day until better in terms of the cough,      then reduce the dose down to a floor of 5 mg per day before      returning here in 6 weeks.  After much discussion it turns out he      is seeing Dr. Alan Mulder for ocular evaluation.  I told him that      he needs to keep up with regular followup and I will let Dr.      Ashley Royalty know that I am following his sarcoid here on prednisone.  2. Finally, he does have significant obesity and a history of      hypertension that may exacerbate on prednisone.  I have emphasized      the importance of maintaining diet and spent extra time going over      the natural history of sarcoid as well as several options for him      in terms of dietary modification and exercise regimen that he      should be able to do more consistently once his breathing better.      (I think he tends to minimize his activities which serves to      worsen his obesity, because his sarcoid is not well controlled.)  I      recommended a full restaging today of systemic and pulmonary      activity with chest x-ray and lab work that are pending.     Charlaine Dalton. Sherene Sires, MD, [facility]  Electronically Signed    MBW/MedQ  DD: 03/08/2007  DT: 03/08/2007  Job #: [id number]

## 2010-10-12 NOTE — Progress Notes (Signed)
  Subjective:    Patient ID: [name], male    DOB: March 27, 1966, 45 y.o.   MRN: [id number]  HPI  45 year old patient who has a history of morbid obesity hypertension dyslipidemia and a history of nonsustained V. tachycardia. There has been some compliance issues in the past and presently he is on no medications. Metoprolol was refilled 4 months ago but he is again out of medications. He has also run out of his simvastatin for dyslipidemia. His chief complaint today is chronic daily headaches. He states that he has fairly generalized throbbing headaches that are both in the frontal temporal and occipital areas. He states to be thrombosed daily for the past 2 years they last 20-30 minutes and often occur every hour. He receives temporary benefit from ibuprofen and tramadol. He does have a right cataract and has been followed closely by ophthalmology. Presumably there is no evidence of papilledema. He was seen in the ED in January due to severe headaches and a head CT was negative. Associated symptoms included weakness dizziness but he denies any nausea or photophobia. He states his mother has a history of migraine headaches. He is requesting an excuse from jury service due to headaches and his multiple medical problems.    Review of Systems  Constitutional: Positive for fatigue. Negative for fever, chills and appetite change.  HENT: Negative for hearing loss, ear pain, congestion, sore throat, trouble swallowing, neck stiffness, dental problem, voice change and tinnitus.   Eyes: Negative for pain, discharge and visual disturbance.  Respiratory: Negative for cough, chest tightness, wheezing and stridor.   Cardiovascular: Negative for chest pain, palpitations and leg swelling.  Gastrointestinal: Negative for nausea, vomiting, abdominal pain, diarrhea, constipation, blood in stool and abdominal distention.  Genitourinary: Negative for urgency, hematuria, flank pain, discharge, difficulty urinating  and genital sores.  Musculoskeletal: Negative for myalgias, back pain, joint swelling, arthralgias and gait problem.  Skin: Negative for rash.  Neurological: Positive for headaches. Negative for dizziness, syncope, speech difficulty, weakness and numbness.  Hematological: Negative for adenopathy. Does not bruise/bleed easily.  Psychiatric/Behavioral: Negative for behavioral problems and dysphoric mood. The patient is not nervous/anxious.        Objective:   Physical Exam  Constitutional: He is oriented to person, place, and time. He appears well-developed and well-nourished. No distress.       Morbidly obese Blood pressure 140/90  HENT:  Head: Normocephalic.  Right Ear: External ear normal.  Left Ear: External ear normal.  Eyes: Conjunctivae and EOM are normal.       Right fundus not visualized Left fundus appeared normal  Neck: Normal range of motion.  Cardiovascular: Normal rate, regular rhythm and normal heart sounds.   Pulmonary/Chest: Breath sounds normal.  Abdominal: Bowel sounds are normal.  Musculoskeletal: Normal range of motion. He exhibits no edema and no tenderness.  Neurological: He is alert and oriented to person, place, and time.  Psychiatric: He has a normal mood and affect. His behavior is normal.          Assessment & Plan:   Chronic daily headaches Morbid obesity History of nonsustained ventricular tachycardia History of hypertension  Resume his medications including tramadol for pain which has been helpful as well as metoprolol Cardiology followup encouraged We'll set up for neurology evaluation due to his chronic daily headaches Letter for  for excuse from jury service dispensed

## 2010-10-12 NOTE — Assessment & Plan Note (Signed)
[facility]                             PULMONARY OFFICE NOTE   NAME:[name]                   MRN:          [id number]  DATE:04/23/2007                            DOB:          [birth year]    This is an extended followup office visit.   HISTORY:  A 45 year old black male diagnosed with symptoms of cough  presumably related to sarcoid that was diagnosed in January 2007, with  symptoms a year prior to that.  He has been on and off prednisone (with  documented nonadherent) since that time and most recently was seen in  October 2008, with a dry cough that had been typical  of his sarcoid  symptoms that developed again off of prednisone.  The prednisone was  restarted at 20 mg per day until better with documented rhonchi on exam  and then reduced to 10 mg per day thereafter.  I was also concerned  about visual problems that he is having.  He was seen by Dr. Ashley Royalty,  referred to [facility], but has not heard anything from them.  Note that he still has a sensation of visual floaters.  He is  complaining of a toothache today for the last week but denies any  significant cough.  Of note, he also complains of being wobbly on his  legs for the first hour or so of every morning.  He has run out of his  blood pressure medicines but denies orthopnea, TIA, claudication  symptoms, or morning headache.   PHYSICAL EXAMINATION:  GENERAL:  He is an ambulatory black male with  obvious halitosis.  VITAL SIGNS:  He is afebrile with normal vital signs with a blood  pressure of 130/82.  HEENT:  Significant for several decayed molars, upper left molars.  NECK:  Supple without cervical adenopathy or tenderness.  Trachea is  midline.  No thyromegaly.  LUNGS:  Fields are completely clear bilaterally on  auscultation/percussion.  HEART:  There is a regular rhythm without murmur, gallop, or rub.  ABDOMEN:  Obese but soft, benign.  EXTREMITIES:  Warm without calf  tenderness, cyanosis, clubbing, edema.   Lab work from today included a sed rate, CBC, and a chemistry profile.  A chest x-ray was performed as recently as March 07, 2007, during an  exacerbation of his cough and found with no acute changes noted.  At  that time, his ACE level was only 37, and his sed rate was 18 with a  normal CBC and chemistry profile.   IMPRESSION:  1. Pulmonary sarcoidosis manifested by chronic cough that responded      nicely to prednisone.  I therefore explained to him that we are      seeking the lowest dose of prednisone that will suppress the cough      and we will use the cough as a barometer of sarcoid activity.      For now, I have recommended he reduce the dose down to 10 mg, 1/2      daily.  2. 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  Ashley Royalty.  I have emphasized to the patient that he needs to keep      up with his appointments and I would be happy to discuss his case      directly with an ophthalmologist if he will simply give me a name      (which he does not have today) and provide any records necessary.  3. Decayed left upper molar with halitosis.  He already has a Education officer, community      but has not contacted him yet.  I have asked him to contact him      today.  4. Sensation of feeling wobbly on his legs for the first hour every      day.  Not clear that this is due to hypertension but note that he      has been on antihypertensives previously and is off of them.  We      will recheck kidney function test today and referred him back to      primary care.  It is inconvenient for him to go to the [place]      office but he lives relatively close to the [place] office and I      would like him to get re-established with a primary physician there      if possible.   Overall, I believe this patient's prognosis is poor based on his poor  insight and tendency to miss followup visits.  I would like to see him  back every 6 weeks here  in the pulmonary office if he will keep the  appointments and sooner if he develops any increased respiratory  symptoms of cough or dyspnea.     Charlaine Dalton. Sherene Sires, MD, [facility]  Electronically Signed    MBW/MedQ  DD: 04/23/2007  DT: 04/23/2007  Job #: [id number]   cc:   Beulah Gandy. Ashley Royalty, M.D.

## 2010-10-12 NOTE — Patient Instructions (Signed)
Limit your sodium (Salt) intake    It is important that you exercise regularly, at least 20 minutes 3 to 4 times per week.  If you develop chest pain or shortness of breath seek  medical attention.  You need to lose weight.  Consider a lower calorie diet and regular exercise.  Neurology follow up as scheduled Cardiology followup  Return office visit 3 months

## 2010-10-15 NOTE — Assessment & Plan Note (Signed)
[facility]                             PULMONARY OFFICE NOTE   [name], [name]                   MRN:          [id number]  DATE:09/01/2006                            DOB:          04-16-1966    PULMONARY FOLLOWUP OFFICE VISIT   HISTORY:  A 45 year old black male with morbid obesity and sarcoid with  ocular involvement dating back to 2006 with a biopsy in 2007 documenting  noncaseating granulomatous inflammation.  He has been on prednisone  since August 24, 2005 and returns today on 10 mg per day with complaints  of increasing itchy rash in the lower extremities bilaterally.  He  denies any recent unusual exposure history, including change in  medications.   Although the patient was given a medication calendar on previous visit,  he has lost it.  Presently, he says he only takes Micardis 80 mg 1  daily, and prednisone 10 mg daily.   He denies any new ocular or articular complaints, cough, or dyspnea.   PHYSICAL EXAMINATION:  He is an ambulatory obese black male in no acute  distress.  Weight 341 pounds, which is no change from baseline.  Blood pressure  130/84.  HEENT:  Unremarkable.  Oropharynx clear.  LUNGS:  Fields are clear bilaterally to auscultation and percussion.  CARDIAC:  Regular rate and rhythm without murmur, gallop, or rub.  ABDOMEN:  Obese, but otherwise benign.  EXTREMITIES:  Warm without calf tenderness, cyanosis, clubbing, but with  trace edema with classic sarcoid changes with purplish plaques  consistent with sarcoid involvement of the lower extremities.   IMPRESSION:  Probable sarcoidosis.  The other possibility is venous  congestion with stasis dermatitis and secondary inflammation on this  basis, perhaps aggravated by scratching.  Typically, sarcoid does not  itch and, therefore, the diagnosis favors venous stasis dermatitis over  sarcoid.   Since it is not clear, however, at this point, I am going to  recommend  increasing prednisone to 20 mg per day and switch him from Micardis to  Micardis/hydrochlorothiazide 25 mg 1 daily and will bring him back in 4  weeks for followup.   I note in chart review that this patient tends to miss followup  appointments and is having difficulty returning to his primary  physician, Dr. Drue Novel.  I have recommended our primary care service for  this purpose, and today obtained a BMET, hepatic profile, CBC, sed rate,  and ACE level, as well as a chest x-ray for the purpose of  reestablishing here for pulmonary followup for sarcoid.  I also  emphasized to him, ocular care and that he should keep up with his  medicines  as I had recommended previously.  Overall, I spent 15-20 minutes of a 25  minute visit  discussing these issues with the patient in detail and will arrange for  dermatology evaluation if the above measures do not improve his lower  extremity rash.     Charlaine Dalton. Sherene Sires, MD, [facility]  Electronically Signed    MBW/MedQ  DD: 09/02/2006  DT: 09/02/2006  Job #: [id number]

## 2010-10-15 NOTE — Assessment & Plan Note (Signed)
[facility]                               PULMONARY OFFICE NOTE   NAME:[name], [name]                   MRN:          [id number]  DATE:12/22/2005                            DOB:          1965-08-18    HISTORY:  A 45 year old black male diagnosed with sarcoid in March 2007,  with symptoms dating back to December 2006, consisting of mostly cough.  He  has morbid obesity with hypertension and has had difficulty processing and  following instructions, despite having a medication calendar that was  presented to him on September 09, 2005, by our nurse practitioner in the context  of a medication reconciliation visit.   He is not consistent about taking any of the medicines that I gave him and  comes back today stating that his cough is no better, especially when he  lies down at night.  He has a sensation of choking immediately on lying  down with minimal production with slightly discolored sputum.  He denies any  orthopnea, PND, leg swelling, fevers, chills or sweats.   PHYSICAL EXAMINATION:  He is a hoarse, ambulatory black man in acute  distress.  VITAL SIGNS:  Afebrile.  HEENT:  Reveals mild __________.  Oropharynx is clear.  NECK:  Supple without cervical adenopathy or tenderness.  LUNG FIELDS:  Actually clear bilaterally to auscultation and percussion.  Appears with minimal __________ wheeze.  HEART:  Regular rate and rhythm without murmur, gallop or rub.  ABDOMEN:  Soft, obese, benign.  EXTREMITIES:  Without calf tenderness.  No cyanosis, clubbing or edema.   IMPRESSION:  He has nocturnal cough with minimal discolored sputum.  This  may be related to nonspecific or sarcoid-related rhinitis with postnasal  drainage syndrome.  I do not believe it is related to sinusitis.  I had him  taking already a course of Ketac, but recommended the following:  (1)  take  Prednisone 20 mg tablets, 2 q. a.m. until better, and then 10 mg q. a.m.,  thereafter,  as a maintenance for sarcoid; (2)  hypertension is adequately  controlled, although he is not consistent about taking his antihypotensives  as recommended.  Since there is an issue of early involvement from sarcoid,  I have recommended that we try to taper him off the Atenolol, mainly reduce  the dose back to one half daily, and restart Micardis at 80 mg 1 q. a.m.  (samples given).  Also, because of his obesity, he is at risk for reflux.  We recommend that we restart Zegerid at bedtime.  His only other medication  was Furosemide 200 mg q.a.m.  I have recommended for his cough to take  Delsym, two teaspoons q. 12 and for severe cough or painful coughing I  recommended Vicodin 1 q.4h.   Followup will be in four weeks.  If he fails to improve on the above  regimen, the next step will be a sinus CT scan.   ADDENDUM:  Dr. Drue [name] has informed that the patient has significant ocular  sarcoid as well.  The patient did not know the name of his eye doctor,  but  states he is planning to follow up with him in the morning.   I believe this patient is relative for high risk, for misunderstanding his  instructions and followup, based on his lack of insight today in terms of  who his doctor was and what medications he was taking for his blood  pressure.  I attempted to help this patient today by generating another  medication calendar, showing him the instructions in the top, which say,  Keep this sheet with you and bring it in for all office visits,  emphasizing to him that this did not mean the pulmonary clinic, but to all  doctors he sees.                                   Charlaine Dalton. Sherene Sires, MD, [facility]   MBW/MedQ  DD:  12/22/2005  DT:  12/22/2005  Job #:  [id number]   cc:   Willow Ora, MD

## 2010-10-15 NOTE — Assessment & Plan Note (Signed)
[facility]                             PULMONARY OFFICE NOTE   NAME:[name], [name]                   MRN:          [id number]  DATE:08/08/2006                            DOB:          July 12, 1965    HISTORY OF PRESENT ILLNESS:  The patient is a 45 year old morbidly  obese, African American male, a patient of Dr. Thurston Hole with a known  history of sarcoid with pulmonary and ocular involvement.  The patient  is maintained on prednisone 10 mg daily.  He presents today complaining  of a increased cough over the last 3 days.  The patient denies any  purulent sputum, fever, orthopnea, PND or leg swelling.  The patient  reports he is being followed by his ophthalmologist currently and is  going for some type of surgery in the next few weeks.  He is unable to  tell me exactly what type of eye surgery at this time.  The patient has  not been seen in the office in greater than 9 months.  He reports that  he is getting ready to run out of his prednisone and Micardis  prescriptions and needs refills.  He also has not seen Dr. Drue Novel, his  primary care physician recently.  Reporting it is difficult for him to  get out to his office which is in [place].   PAST MEDICAL HISTORY:  Is reviewed.   CURRENT MEDICATIONS:  Is reviewed.   PHYSICAL EXAMINATION:  The patient is a morbidly obese black male in no  acute distress.  He is afebrile with stable vital signs.  O2 saturation  98% on room air.  HEENT:  Right conjunctiva is injected.  Nasal mucosa is slightly pale.  Posterior pharynx is clear.  NECK:  Is supple without cervical adenopathy.  No JVD.  LUNGS:  Lung sounds are diminished at the bases, otherwise clear.  CARDIAC:  Regular rate.  ABDOMEN:  Is morbidly obese with a large panniculus.  EXTREMITIES:  Are warm without any calf tenderness, cyanosis or  clubbing.  There is trace edema with venous insufficiency.   IMPRESSION AND PLAN:  1. Sarcoid question flare  .  Patient is to increase prednisone up to      20 mg for the next week and then decrease to 10 mg daily.  He will      follow back up with Dr. Sherene Sires in 3 to 4 weeks or sooner if needed.  2. Hypertension.  Patient was given 1 refill of his Micardis and      recommended to follow back up with his primary care physician.  If      he cannot routinely see Dr. Drue Novel he may need to establish with a      separate primary care physician which we do have in our building on      the      1st floor.  I have given him the name and number.  The patient is      recommended to follow up accordingly.      Rubye Oaks, NP  Electronically Signed  Charlaine Dalton. Sherene Sires, MD, [facility]  Electronically Signed   TP/MedQ  DD: 08/08/2006  DT: 08/08/2006  Job #: [id number]
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
## 2010-10-15 NOTE — Op Note (Signed)
[name], [name]            ACCOUNT NO.:  [id number]   MEDICAL RECORD NO.:  [id number]          PATIENT TYPE:  AMB   LOCATION:  CARD                         FACILITY:  [facility]   PHYSICIAN:  Casimiro Needle B. Sherene Sires, M.D. [facility] OF BIRTH:  1965-10-27   DATE OF PROCEDURE:  08/11/2005  DATE OF DISCHARGE:  08/11/2005                                 OPERATIVE REPORT   PROCEDURE:  Fiberoptic bronchoscopy with transbronchial biopsy of the left  upper lobe.   HISTORY AND INDICATIONS:  Please see dictated pulmonary consultation note.  The procedure was performed in the bronchoscopy suite after informed consent  was obtained from the patient in the office.  Please see attached notes.   The patient was premedicated with 1% lidocaine by updraft nebulizer,  continuously monitored by surface ECG and oximetry and maintained adequate  saturation throughout the procedure in sinus rhythm.  The patient received a  total of 5 mg IV Versed and 100 mg of IV Demerol for adequate sedation and  cough suppression.   Using a standard flexible fiberoptic bronchoscope, the right naris was  easily cannulated with good visualization of the entire oropharynx and  larynx.  The cords moved normally, and there were no apparent upper airway  lesions.   Using additional 1% lidocaine as needed, the entire tracheobronchial tree  was explored bilaterally with the following findings.   1.  The trachea, carina, and all of the major airways opened widely to the      __________ level with the only abnormalities as follows:      1.  There was marked cobblestoning at the left upper lobe and left lower          lobe air dividers with heaped up treatment that looked somewhat          neoplastic in nature but lacked hypervascularity.   PROCEDURE:  I obtained 2 biopsies within the heaped up tissue, involving the  left upper lobe but because the differential diagnosis included sarcoid, did  additional 4 transbronchial biopsies in  the left upper lobe using a  transbronchial approach with adequate tissue and no obvious bleeding or  pneumothorax.  The lingula was also selectively lavaged with adequate return  sent for cytology, AFB, and fungal staining culture.   The patient tolerated the procedure well and will be followed up as an  outpatient.   IMPRESSION:  Bilateral hilar mediastinal adenopathy consistent with sarcoid  with severe cobblestoning isolated to the left lung which is more worrisome  for malignancy based on the asymmetry with cultures pending.           ______________________________  Charlaine Dalton. Sherene Sires, M.D. [facility]     MBW/MEDQ  D:  08/11/2005  T:  08/13/2005  Job:  [id number]   cc:   Wanda Plump, MD [facility]  [phone] [place], [place] [address]
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
## 2010-11-10 ENCOUNTER — Other Ambulatory Visit: Payer: Self-pay | Admitting: Internal Medicine

## 2010-11-15 ENCOUNTER — Telehealth: Payer: Self-pay

## 2010-11-15 NOTE — Telephone Encounter (Signed)
error 

## 2010-12-13 ENCOUNTER — Ambulatory Visit: Payer: Self-pay | Admitting: Internal Medicine

## 2011-01-12 ENCOUNTER — Ambulatory Visit (INDEPENDENT_AMBULATORY_CARE_PROVIDER_SITE_OTHER): Payer: Medicare Other | Admitting: Internal Medicine

## 2011-01-12 ENCOUNTER — Encounter: Payer: Self-pay | Admitting: Internal Medicine

## 2011-01-12 DIAGNOSIS — I10 Essential (primary) hypertension: Secondary | ICD-10-CM

## 2011-01-12 DIAGNOSIS — R51 Headache: Secondary | ICD-10-CM

## 2011-01-12 DIAGNOSIS — I2510 Atherosclerotic heart disease of native coronary artery without angina pectoris: Secondary | ICD-10-CM

## 2011-01-12 DIAGNOSIS — E785 Hyperlipidemia, unspecified: Secondary | ICD-10-CM

## 2011-01-12 MED ORDER — METOPROLOL SUCCINATE ER 50 MG PO TB24: 50.0000 mg | ORAL_TABLET | Freq: Every day | ORAL | Status: DC

## 2011-01-12 MED ORDER — TRAMADOL HCL 50 MG PO TABS: 50.0000 mg | ORAL_TABLET | Freq: Four times a day (QID) | ORAL | Status: DC | PRN

## 2011-01-12 MED ORDER — SIMVASTATIN 20 MG PO TABS: 20.0000 mg | ORAL_TABLET | Freq: Every day | ORAL | Status: DC

## 2011-01-12 MED ORDER — NORTRIPTYLINE HCL 25 MG PO CAPS: 25.0000 mg | ORAL_CAPSULE | Freq: Every day | ORAL | Status: DC

## 2011-01-12 NOTE — Patient Instructions (Signed)
Limit your sodium (Salt) intake  Return in 4 months for follow-up    It is important that you exercise regularly, at least 20 minutes 3 to 4 times per week.  If you develop chest pain or shortness of breath seek  medical attention.  You need to lose weight.  Consider a lower calorie diet and regular exercise.

## 2011-01-13 NOTE — Progress Notes (Signed)
  Subjective:    Patient ID: [name], male    DOB: 06/15/1965, 45 y.o.   MRN: [id number]  HPI 45 year old patient who is in today for followup. He has a history of morbid obesity. He has a history of suspected OSA and a sleep study is pending. He has a history of impaired glucose tolerance. A random blood sugar today 98. He has seen Dr. Anne Hahn for headaches which have greatly improved he is now on nortriptyline 25 mg at bedtime. He has morbid obesity and his weight remains in excess of 350 pounds he has low back pain coronary artery disease and a history of ventricular tachycardia. He has treated dyslipidemia  Review of Systems  Constitutional: Negative for fever, chills, appetite change and fatigue.  HENT: Negative for hearing loss, ear pain, congestion, sore throat, trouble swallowing, neck stiffness, dental problem, voice change and tinnitus.   Eyes: Negative for pain, discharge and visual disturbance.  Respiratory: Negative for cough, chest tightness, wheezing and stridor.   Cardiovascular: Negative for chest pain, palpitations and leg swelling.  Gastrointestinal: Negative for nausea, vomiting, abdominal pain, diarrhea, constipation, blood in stool and abdominal distention.  Genitourinary: Negative for urgency, hematuria, flank pain, discharge, difficulty urinating and genital sores.  Musculoskeletal: Negative for myalgias, back pain, joint swelling, arthralgias and gait problem.  Skin: Negative for rash.  Neurological: Negative for dizziness, syncope, speech difficulty, weakness, numbness and headaches.  Hematological: Negative for adenopathy. Does not bruise/bleed easily.  Psychiatric/Behavioral: Negative for behavioral problems and dysphoric mood. The patient is not nervous/anxious.        Objective:   Physical Exam  Constitutional: He is oriented to person, place, and time. He appears well-developed.       Morbidly obese. Blood pressure 130/70  HENT:  Head: Normocephalic.    Right Ear: External ear normal.  Left Ear: External ear normal.  Eyes: Conjunctivae and EOM are normal.  Neck: Normal range of motion.  Cardiovascular: Normal rate and normal heart sounds.   Pulmonary/Chest: Breath sounds normal.  Abdominal: Bowel sounds are normal.  Musculoskeletal: Normal range of motion. He exhibits no edema and no tenderness.  Neurological: He is alert and oriented to person, place, and time.  Psychiatric: He has a normal mood and affect. His behavior is normal.          Assessment & Plan:   Morbid obesity. Efforts at weight loss encouraged Hypertension stable Dyslipidemia Coronary artery disease Headaches much improved

## 2011-01-22 IMAGING — CT CT ABDOMEN W/ CM
1 of 3 series · 13 of 32 positions shown, 19 images · IV contrast (APPLIED)
Comparison: Plain film chest of 08/23/2009.

CT CHEST

CLINICAL DATA: GI bleed.  Post endoscopy.  Dilated distal
esophageal stricture.  Gastroduodenitis.  Vomiting.  Hematemesis.
EGD 08/21/2009.

CT CHEST AND ABDOMEN WITH CONTRAST
TECHNIQUE: Multidetector CT imaging of the chest and abdomen was
performed following the standard protocol during bolus
administration of intravenous contrast.
Contrast: 100  ml 3mnipaque-DII

[Series 2: c/a 5.0 b31f · axial · 0.88mm/px · z∈[-260,+135]mm · 13 of 93 slices shown, 19 images]
[im 7/93  soft-tissue]
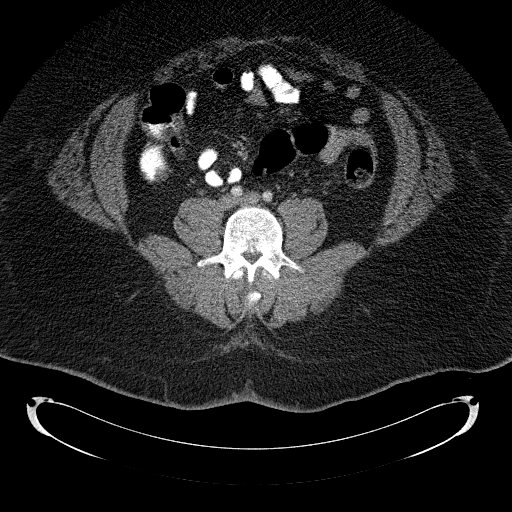
[im 7/93  bone]
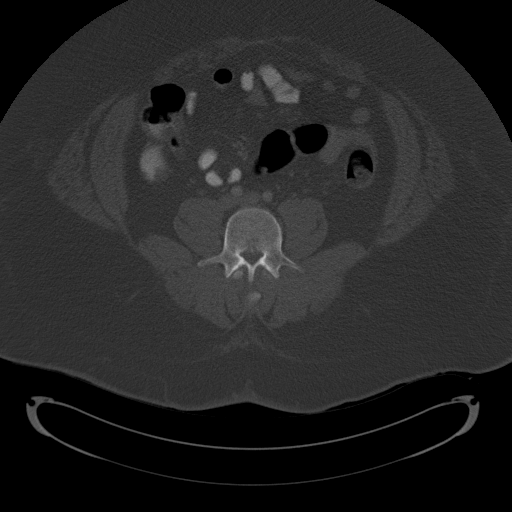
[im 13/93  soft-tissue]
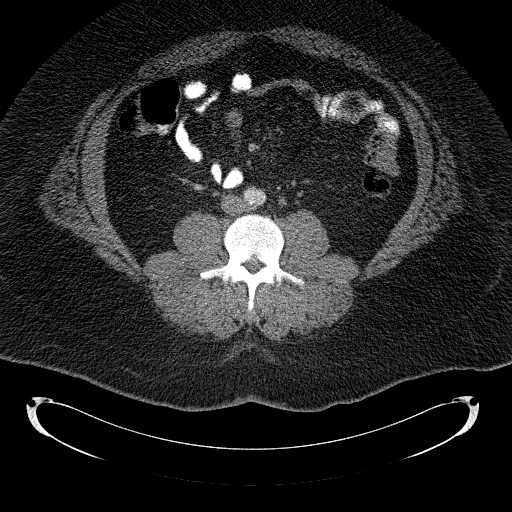
[im 19/93  soft-tissue]
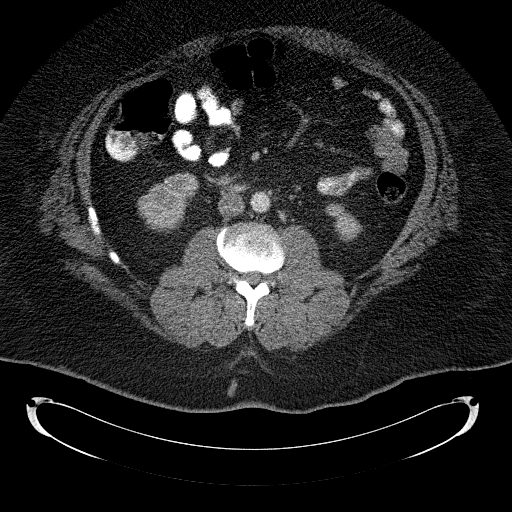
[im 25/93  soft-tissue]
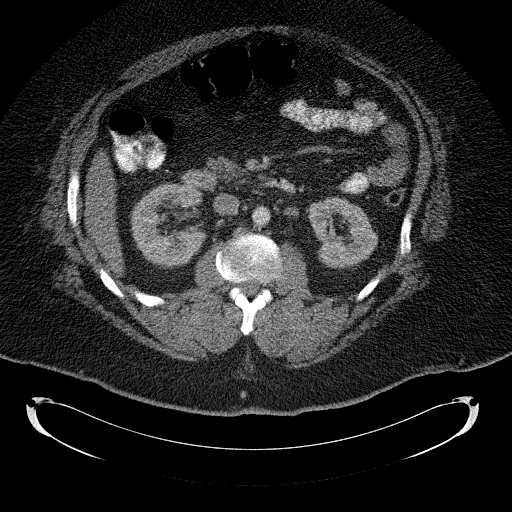
[im 31/93  soft-tissue]
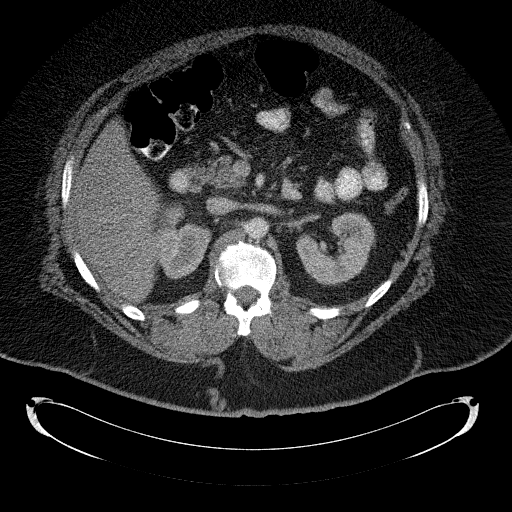
[im 37/93  soft-tissue]
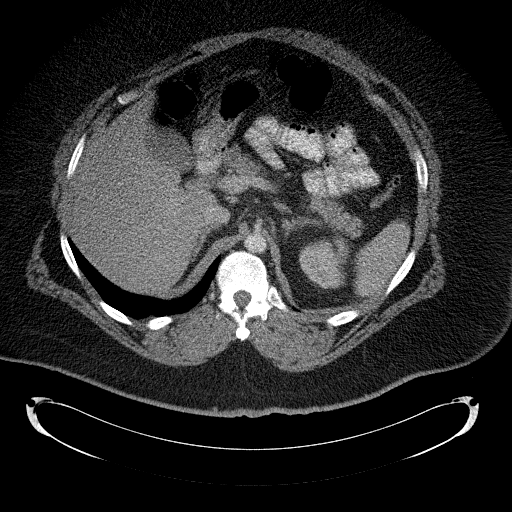
[im 50/93  soft-tissue]
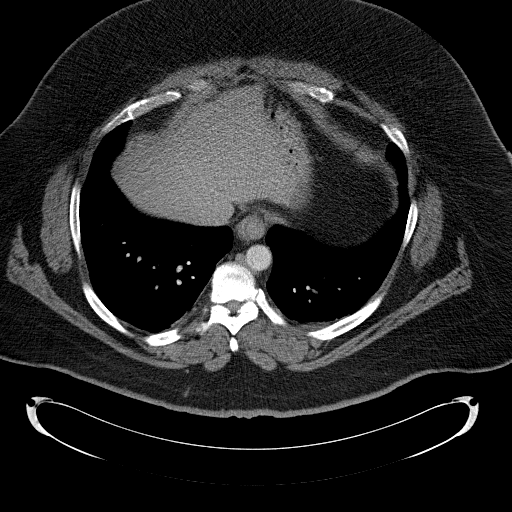
[im 56/93  soft-tissue]
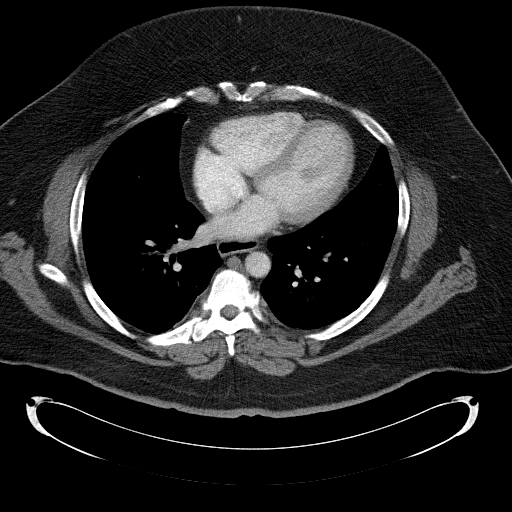
[im 62/93  soft-tissue]
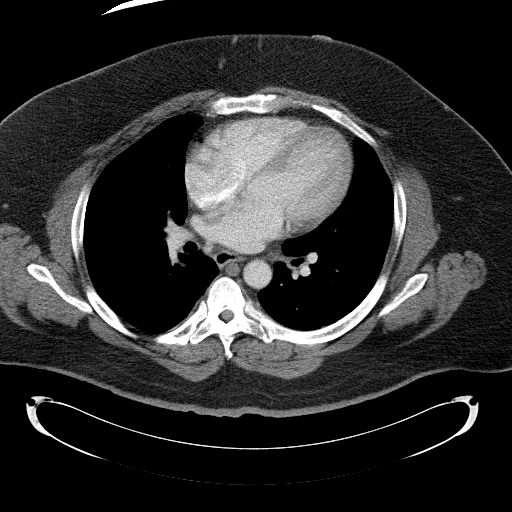
[im 62/93  bone]
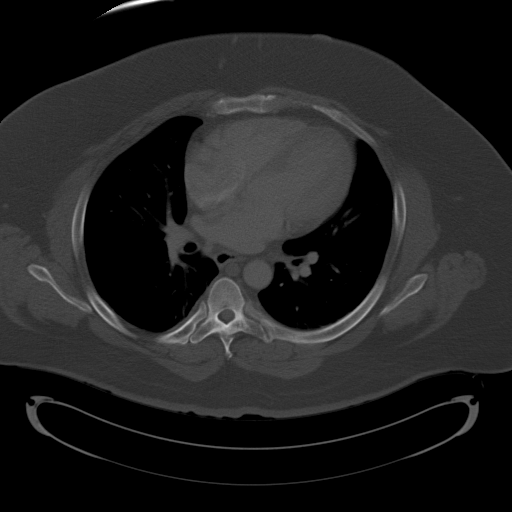
[im 68/93  soft-tissue]
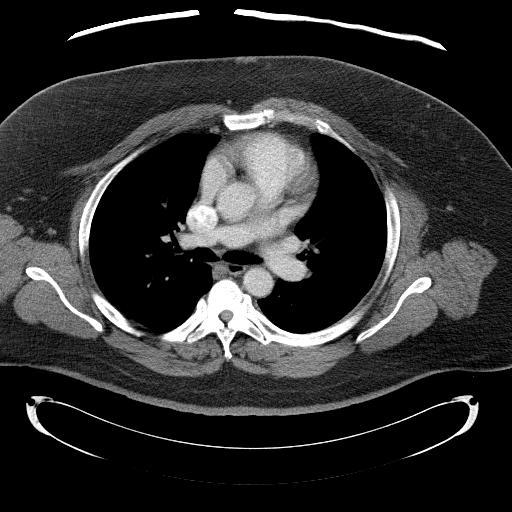
[im 68/93  lung]
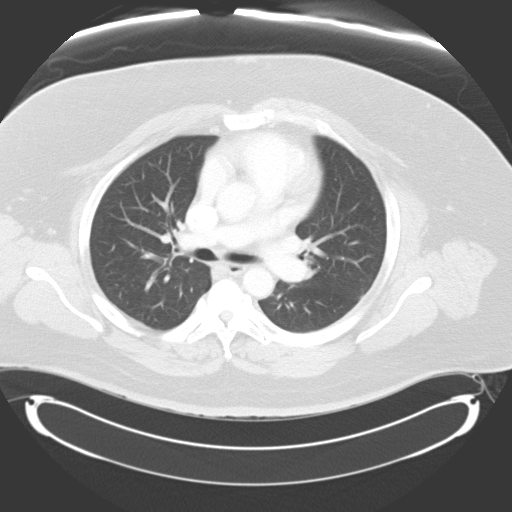
[im 74/93  soft-tissue]
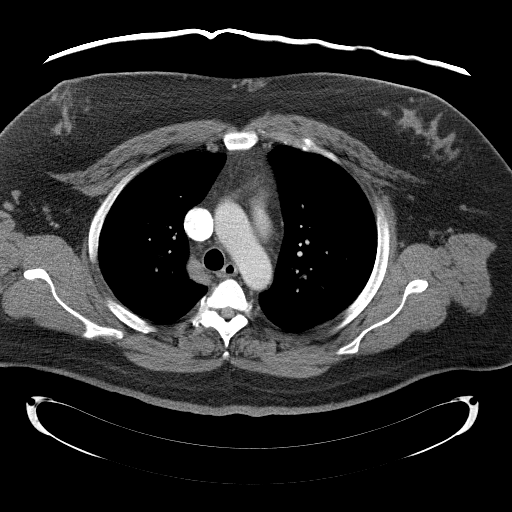
[im 74/93  lung]
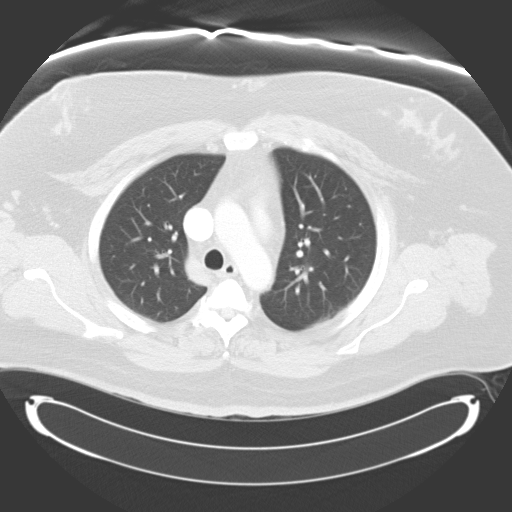
[im 80/93  soft-tissue]
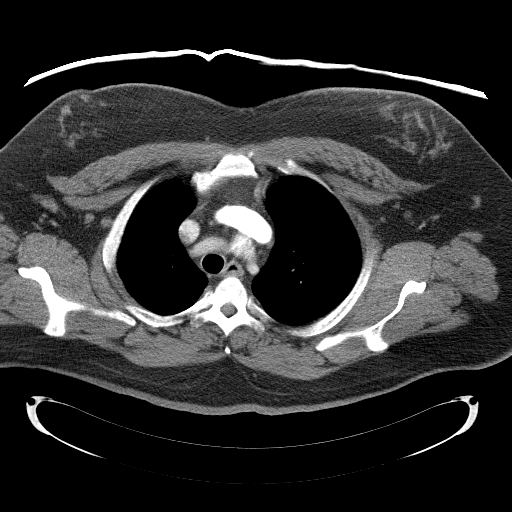
[im 80/93  lung]
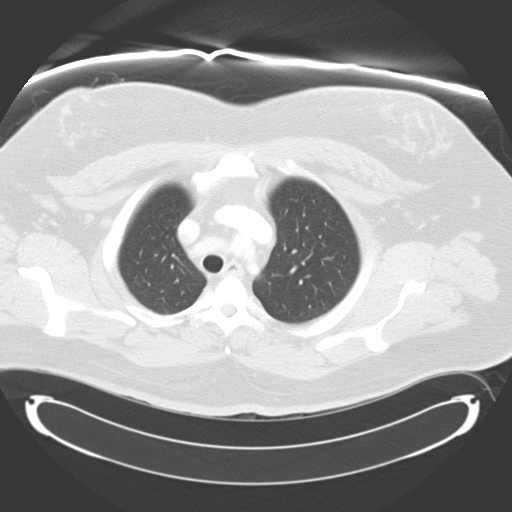
[im 86/93  soft-tissue]
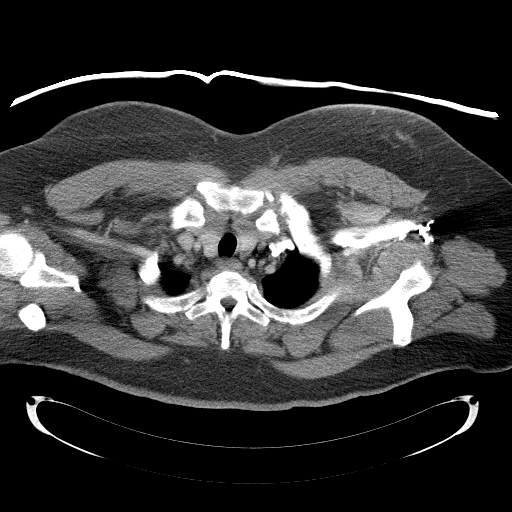
[im 86/93  lung]
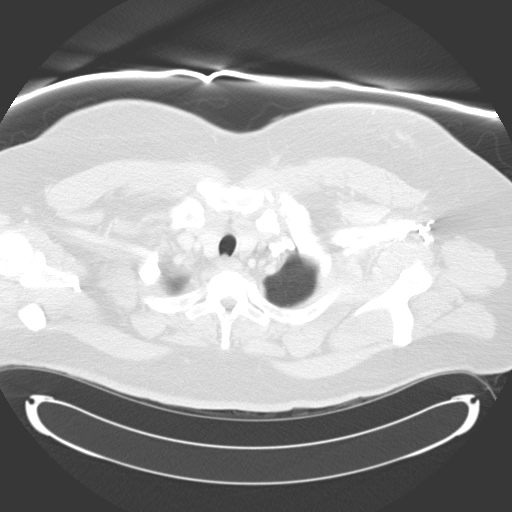

[13 of 32 positions shown; findings below may reference images not displayed]

FINDINGS: Lung windows demonstrate mild motion artifact.  Minimal
bibasilar atelectasis.

Soft tissue windows demonstrate bilateral gynecomastia.  Borderline
cardiomegaly without pericardial or pleural effusion. No
mediastinal or hilar adenopathy. A tiny hiatal hernia with the
distal esophageal wall thickening on image 41.
IMPRESSION: 1. No acute process in the chest.
2.  Small hiatal hernia with distal esophageal wall thickening,
possibly related to esophagitis or stricture.
3.  Bilateral gynecomastia.

CT ABDOMEN
FINDINGS: Fatty infiltration of the liver.  Abdominal portion is
mildly degraded due to patient body habitus.  Normal spleen.
Question gastric fold thickening on image 48.  Normal pancreas,
gallbladder, biliary tract, adrenal glands.  Scarred irregular
bilateral kidneys.

No retroperitoneal or retrocrural adenopathy.

Normal abdominal portions of the colon, appendix, and terminal
ileum.  Normal caliber of abdominal bowel loops.  No ascites or
free intraperitoneal air. No acute osseous abnormality.
IMPRESSION: 1. No acute abdominal process.
2.  Mildly degraded due to patient body habitus.
3.  Fatty infiltration of the liver.
4.  Question gastritis.

## 2011-02-09 ENCOUNTER — Emergency Department (HOSPITAL_COMMUNITY): Payer: Medicare Other

## 2011-02-09 ENCOUNTER — Emergency Department (HOSPITAL_COMMUNITY)
Admission: EM | Admit: 2011-02-09 | Discharge: 2011-02-10 | Disposition: A | Discharge status: Home / Self Care | Payer: Medicare Other | Attending: Emergency Medicine | Admitting: Emergency Medicine

## 2011-02-09 DIAGNOSIS — M7989 Other specified soft tissue disorders: Secondary | ICD-10-CM | POA: Insufficient documentation

## 2011-02-09 DIAGNOSIS — R0989 Other specified symptoms and signs involving the circulatory and respiratory systems: Secondary | ICD-10-CM | POA: Insufficient documentation

## 2011-02-09 DIAGNOSIS — R0609 Other forms of dyspnea: Secondary | ICD-10-CM | POA: Insufficient documentation

## 2011-02-09 DIAGNOSIS — D869 Sarcoidosis, unspecified: Secondary | ICD-10-CM | POA: Insufficient documentation

## 2011-02-09 DIAGNOSIS — M79609 Pain in unspecified limb: Secondary | ICD-10-CM | POA: Insufficient documentation

## 2011-02-09 DIAGNOSIS — I10 Essential (primary) hypertension: Secondary | ICD-10-CM | POA: Insufficient documentation

## 2011-02-09 DIAGNOSIS — R059 Cough, unspecified: Secondary | ICD-10-CM | POA: Insufficient documentation

## 2011-02-09 DIAGNOSIS — R05 Cough: Secondary | ICD-10-CM | POA: Insufficient documentation

## 2011-02-09 DIAGNOSIS — E669 Obesity, unspecified: Secondary | ICD-10-CM | POA: Insufficient documentation

## 2011-02-10 ENCOUNTER — Emergency Department (HOSPITAL_COMMUNITY): Payer: Medicare Other

## 2011-02-10 ENCOUNTER — Ambulatory Visit (HOSPITAL_COMMUNITY)
Admission: RE | Admit: 2011-02-10 | Discharge: 2011-02-10 | Disposition: A | Discharge status: Home / Self Care | Payer: Medicare Other | Source: Ambulatory Visit | Attending: Emergency Medicine | Admitting: Emergency Medicine

## 2011-02-10 DIAGNOSIS — M79609 Pain in unspecified limb: Secondary | ICD-10-CM

## 2011-02-10 LAB — BASIC METABOLIC PANEL
BUN: 16 mg/dL (ref 6–23)
Chloride: 101 mEq/L (ref 96–112)
GFR calc non Af Amer: >60 mL/min (ref >60)
Glucose, Bld: 111 mg/dL — ABNORMAL HIGH (ref 70–99)
Potassium: 4 mEq/L (ref 3.5–5.1)
Sodium: 138 mEq/L (ref 135–145)

## 2011-02-10 LAB — CBC
HCT: 39.1 % (ref 39.0–52.0)
Hemoglobin: 12.7 g/dL — ABNORMAL LOW (ref 13.0–17.0)
MCHC: 32.5 g/dL (ref 30.0–36.0)
RBC: 4.08 MIL/uL — ABNORMAL LOW (ref 4.22–5.81)
WBC: 9.3 10*3/uL (ref 4.0–10.5)

## 2011-02-10 LAB — DIFFERENTIAL
Basophils Absolute: 0 10*3/uL (ref 0.0–0.1)
Basophils Relative: 0 % (ref 0–1)
Lymphocytes Relative: 27 % (ref 12–46)
Neutro Abs: 5.9 10*3/uL (ref 1.7–7.7)
Neutrophils Relative %: 64 % (ref 43–77)

## 2011-02-10 MED ORDER — IOHEXOL 350 MG/ML SOLN
100.0000 mL | Freq: Once | INTRAVENOUS | Status: AC | PRN
  Administered 2011-02-10: 100 mL via INTRAVENOUS

## 2011-03-10 LAB — URINE MICROSCOPIC-ADD ON

## 2011-03-10 LAB — URINALYSIS, ROUTINE W REFLEX MICROSCOPIC
Glucose, UA: NEGATIVE
Hgb urine dipstick: NEGATIVE
Protein, ur: NEGATIVE
Specific Gravity, Urine: 1.024
Urobilinogen, UA: 1

## 2011-05-04 ENCOUNTER — Ambulatory Visit (INDEPENDENT_AMBULATORY_CARE_PROVIDER_SITE_OTHER): Payer: Medicare Other | Admitting: Internal Medicine

## 2011-05-04 ENCOUNTER — Encounter: Payer: Self-pay | Admitting: Internal Medicine

## 2011-05-04 DIAGNOSIS — D869 Sarcoidosis, unspecified: Secondary | ICD-10-CM

## 2011-05-04 DIAGNOSIS — J45909 Unspecified asthma, uncomplicated: Secondary | ICD-10-CM

## 2011-05-04 DIAGNOSIS — J069 Acute upper respiratory infection, unspecified: Secondary | ICD-10-CM

## 2011-05-04 MED ORDER — BENZONATATE 200 MG PO CAPS: 200.0000 mg | ORAL_CAPSULE | Freq: Three times a day (TID) | ORAL | Status: AC | PRN

## 2011-05-04 MED ORDER — HYDROCODONE-HOMATROPINE 5-1.5 MG/5ML PO SYRP: 5.0000 mL | ORAL_SOLUTION | Freq: Four times a day (QID) | ORAL | Status: AC | PRN

## 2011-05-04 NOTE — Patient Instructions (Signed)
Mucinex DM one twice daily  Get plenty of rest, Drink lots of  clear liquids, and use Tylenol or ibuprofen for fever and discomfort.    If you develop any itching with the liquid cough preparation discontinued and use Tessalon Perles  Call or return to clinic prn if these symptoms worsen or fail to improve as anticipated or if you develop high fever or shortness of breath

## 2011-05-04 NOTE — Progress Notes (Signed)
  Subjective:    Patient ID: [name], male    DOB: 05-30-1966, 45 y.o.   MRN: [id number]  HPI  45 year old patient who presents with a three-day history of head and chest congestion with cough. Cough is nonproductive quite incessant and interferes with his sleep. There's been no fever or chills. He does have a history of asthma and sarcoidosis. Denies any wheezing. He has a history of codeine allergy that caused some mild itching in the past. He believes he has tolerated hydrocodone without difficulty however    Review of Systems  Constitutional: Negative for fever, chills, appetite change and fatigue.  HENT: Positive for congestion and rhinorrhea. Negative for hearing loss, ear pain, sore throat, trouble swallowing, neck stiffness, dental problem, voice change and tinnitus.   Eyes: Negative for pain, discharge and visual disturbance.  Respiratory: Positive for cough. Negative for chest tightness, shortness of breath, wheezing and stridor.   Cardiovascular: Negative for chest pain, palpitations and leg swelling.  Gastrointestinal: Negative for nausea, vomiting, abdominal pain, diarrhea, constipation, blood in stool and abdominal distention.  Genitourinary: Negative for urgency, hematuria, flank pain, discharge, difficulty urinating and genital sores.  Musculoskeletal: Negative for myalgias, back pain, joint swelling, arthralgias and gait problem.  Skin: Negative for rash.  Neurological: Negative for dizziness, syncope, speech difficulty, weakness, numbness and headaches.  Hematological: Negative for adenopathy. Does not bruise/bleed easily.  Psychiatric/Behavioral: Negative for behavioral problems and dysphoric mood. The patient is not nervous/anxious.        Objective:   Physical Exam  Constitutional: He is oriented to person, place, and time. He appears well-developed. No distress.       Morbidly obese No distress at rest although appears unwell Temperature 98.3  HENT:    Head: Normocephalic.  Right Ear: External ear normal.  Left Ear: External ear normal.  Eyes: Conjunctivae and EOM are normal.  Neck: Normal range of motion.  Cardiovascular: Normal rate and normal heart sounds.   Pulmonary/Chest: Effort normal and breath sounds normal. No respiratory distress. He has no wheezes. He has no rales.       A few bronchial type breath sounds but essentially clear O2 saturation 96% Pulse rate 86  Abdominal: Bowel sounds are normal.  Musculoskeletal: Normal range of motion. He exhibits no edema and no tenderness.  Neurological: He is alert and oriented to person, place, and time.  Psychiatric: He has a normal mood and affect. His behavior is normal.          Assessment & Plan:    Viral URI. Will treat symptomatically. We'll give a prescription for Hydromet. If this is not tolerated will try Tessalon Perles. We'll also use Mucinex DM and force fluids. He will report any worsening fever chest pain or shortness of breath

## 2011-05-13 ENCOUNTER — Ambulatory Visit: Payer: Medicare Other | Admitting: Internal Medicine

## 2011-06-17 DIAGNOSIS — Z961 Presence of intraocular lens: Secondary | ICD-10-CM | POA: Diagnosis not present

## 2011-06-17 DIAGNOSIS — H04129 Dry eye syndrome of unspecified lacrimal gland: Secondary | ICD-10-CM | POA: Diagnosis not present

## 2011-06-17 DIAGNOSIS — H2510 Age-related nuclear cataract, unspecified eye: Secondary | ICD-10-CM | POA: Diagnosis not present

## 2011-06-21 DIAGNOSIS — H2000 Unspecified acute and subacute iridocyclitis: Secondary | ICD-10-CM | POA: Diagnosis not present

## 2011-06-24 ENCOUNTER — Encounter: Payer: Medicare Other | Admitting: Internal Medicine

## 2011-06-24 DIAGNOSIS — Z0289 Encounter for other administrative examinations: Secondary | ICD-10-CM

## 2011-07-18 DIAGNOSIS — H269 Unspecified cataract: Secondary | ICD-10-CM | POA: Diagnosis not present

## 2011-07-18 DIAGNOSIS — H209 Unspecified iridocyclitis: Secondary | ICD-10-CM | POA: Diagnosis not present

## 2011-07-18 DIAGNOSIS — H2510 Age-related nuclear cataract, unspecified eye: Secondary | ICD-10-CM | POA: Diagnosis not present

## 2011-08-05 ENCOUNTER — Ambulatory Visit (INDEPENDENT_AMBULATORY_CARE_PROVIDER_SITE_OTHER): Payer: Medicare Other | Admitting: Internal Medicine

## 2011-08-05 ENCOUNTER — Encounter: Payer: Self-pay | Admitting: Internal Medicine

## 2011-08-05 DIAGNOSIS — I472 Ventricular tachycardia: Secondary | ICD-10-CM

## 2011-08-05 DIAGNOSIS — R609 Edema, unspecified: Secondary | ICD-10-CM

## 2011-08-05 DIAGNOSIS — F341 Dysthymic disorder: Secondary | ICD-10-CM

## 2011-08-05 DIAGNOSIS — E785 Hyperlipidemia, unspecified: Secondary | ICD-10-CM | POA: Diagnosis not present

## 2011-08-05 DIAGNOSIS — I10 Essential (primary) hypertension: Secondary | ICD-10-CM

## 2011-08-05 DIAGNOSIS — I2510 Atherosclerotic heart disease of native coronary artery without angina pectoris: Secondary | ICD-10-CM

## 2011-08-05 DIAGNOSIS — R600 Localized edema: Secondary | ICD-10-CM

## 2011-08-05 MED ORDER — NORTRIPTYLINE HCL 25 MG PO CAPS: 25.0000 mg | ORAL_CAPSULE | Freq: Every day | ORAL | Status: DC

## 2011-08-05 MED ORDER — FUROSEMIDE 40 MG PO TABS: 40.0000 mg | ORAL_TABLET | Freq: Every day | ORAL | Status: DC

## 2011-08-05 MED ORDER — POTASSIUM CHLORIDE CRYS ER 20 MEQ PO TBCR: 20.0000 meq | EXTENDED_RELEASE_TABLET | Freq: Every day | ORAL | Status: DC

## 2011-08-05 MED ORDER — SIMVASTATIN 20 MG PO TABS: 20.0000 mg | ORAL_TABLET | Freq: Every day | ORAL | Status: DC

## 2011-08-05 MED ORDER — METOPROLOL SUCCINATE ER 50 MG PO TB24: 50.0000 mg | ORAL_TABLET | Freq: Every day | ORAL | Status: DC

## 2011-08-05 NOTE — Progress Notes (Signed)
  Subjective:    Patient ID: [name], male    DOB: [birth year], 46 y.o.   MRN: [id number]  HPI  46 year old patient who presents today complaining of bilateral lower extremity edema the right leg greater than the left. He has a history of nonobstructive CAD and normal LV function. He does have a history of hypertension and LVH. His mouth blood pressure medications for a couple of weeks. He is scheduled for a physical next month. He has dyslipidemia. He denies any chest pain or symptoms of congestive heart failure. His only complaint is lower extremity edema. He did have venous Doppler studies performed of the lower extremities last fall. He is exercising regularly and attempting to lose weight. He states that he continues to adhere to a low-salt diet. He has seen cardiology in the past and does have a history of ventricular tachycardia. There is also a history of present pons and does complain of polyuria.    Review of Systems  Constitutional: Negative for fever, chills, appetite change and fatigue.  HENT: Negative for hearing loss, ear pain, congestion, sore throat, trouble swallowing, neck stiffness, dental problem, voice change and tinnitus.   Eyes: Negative for pain, discharge and visual disturbance.  Respiratory: Negative for cough, chest tightness, wheezing and stridor.   Cardiovascular: Positive for leg swelling. Negative for chest pain and palpitations.  Gastrointestinal: Negative for nausea, vomiting, abdominal pain, diarrhea, constipation, blood in stool and abdominal distention.  Genitourinary: Positive for frequency. Negative for urgency, hematuria, flank pain, discharge, difficulty urinating and genital sores.  Musculoskeletal: Negative for myalgias, back pain, joint swelling, arthralgias and gait problem.  Skin: Negative for rash.  Neurological: Negative for dizziness, syncope, speech difficulty, weakness, numbness and headaches.  Hematological: Negative for adenopathy. Does  not bruise/bleed easily.  Psychiatric/Behavioral: Negative for behavioral problems and dysphoric mood. The patient is not nervous/anxious.        Objective:   Physical Exam  Constitutional: He is oriented to person, place, and time. He appears well-developed.       Morbidly obese  Blood pressure 142/92  HENT:  Head: Normocephalic.  Right Ear: External ear normal.  Left Ear: External ear normal.  Eyes: Conjunctivae and EOM are normal.  Neck: Normal range of motion. Neck supple. No JVD present.  Cardiovascular: Normal rate, regular rhythm and normal heart sounds.  Exam reveals no gallop.   No murmur heard.      No gallop  Pulmonary/Chest: Breath sounds normal. No respiratory distress. He has no wheezes. He has no rales. He exhibits no tenderness.       O2 saturation 98%  Abdominal: Bowel sounds are normal.  Musculoskeletal: Normal range of motion. He exhibits edema. He exhibits no tenderness.       Bilateral lower extremity edema right greater than the left. No tenderness or excessive warmth  Neurological: He is alert and oriented to person, place, and time.  Psychiatric: He has a normal mood and affect. His behavior is normal.          Assessment & Plan:   Lower extremity edema. Will place on diuretic therapy in view of his history of ventricular tachycardia also placed on potassium supplementation Recheck for a physical next month as scheduled Resume of pressure medications  Hypertension suboptimal control Dyslipidemia   All medications refilled

## 2011-08-05 NOTE — Patient Instructions (Signed)
Limit your sodium (Salt) intake  Keep legs elevated as much as possible  Take diuretic therapy as discussed

## 2011-09-23 ENCOUNTER — Ambulatory Visit (INDEPENDENT_AMBULATORY_CARE_PROVIDER_SITE_OTHER): Payer: Medicare Other | Admitting: Internal Medicine

## 2011-09-23 ENCOUNTER — Encounter: Payer: Self-pay | Admitting: Internal Medicine

## 2011-09-23 VITALS — BP 122/80 | HR 78 | Temp 97.6°F | Resp 20 | Ht 71.0 in

## 2011-09-23 DIAGNOSIS — Z0000 Encounter for general adult medical examination without abnormal findings: Secondary | ICD-10-CM

## 2011-09-23 DIAGNOSIS — M25561 Pain in right knee: Secondary | ICD-10-CM

## 2011-09-23 DIAGNOSIS — E785 Hyperlipidemia, unspecified: Secondary | ICD-10-CM | POA: Diagnosis not present

## 2011-09-23 DIAGNOSIS — M25569 Pain in unspecified knee: Secondary | ICD-10-CM

## 2011-09-23 DIAGNOSIS — I10 Essential (primary) hypertension: Secondary | ICD-10-CM

## 2011-09-23 DIAGNOSIS — D869 Sarcoidosis, unspecified: Secondary | ICD-10-CM

## 2011-09-23 DIAGNOSIS — I472 Ventricular tachycardia, unspecified: Secondary | ICD-10-CM

## 2011-09-23 DIAGNOSIS — M79609 Pain in unspecified limb: Secondary | ICD-10-CM

## 2011-09-23 DIAGNOSIS — I2510 Atherosclerotic heart disease of native coronary artery without angina pectoris: Secondary | ICD-10-CM

## 2011-09-23 LAB — CBC WITH DIFFERENTIAL/PLATELET
Basophils Relative: 0.4 % (ref 0.0–3.0)
Eosinophils Relative: 2.5 % (ref 0.0–5.0)
HCT: 41.4 % (ref 39.0–52.0)
Hemoglobin: 13.4 g/dL (ref 13.0–17.0)
Lymphocytes Relative: 17.9 % (ref 12.0–46.0)
Lymphs Abs: 1.6 10*3/uL (ref 0.7–4.0)
Monocytes Relative: 9.2 % (ref 3.0–12.0)
Neutro Abs: 6.4 10*3/uL (ref 1.4–7.7)
RBC: 4.36 Mil/uL (ref 4.22–5.81)
RDW: 13.5 % (ref 11.5–14.6)
WBC: 9.1 10*3/uL (ref 4.5–10.5)

## 2011-09-23 LAB — COMPREHENSIVE METABOLIC PANEL
ALT: 15 U/L (ref 0–53)
BUN: 14 mg/dL (ref 6–23)
CO2: 32 mEq/L (ref 19–32)
Creatinine, Ser: 1 mg/dL (ref 0.4–1.5)
GFR: 99 mL/min (ref >60.00)
Total Bilirubin: 0.6 mg/dL (ref 0.3–1.2)

## 2011-09-23 LAB — LIPID PANEL
Cholesterol: 191 mg/dL (ref 0–200)
HDL: 53.6 mg/dL (ref >39.00)
Total CHOL/HDL Ratio: 4
Triglycerides: 51 mg/dL (ref 0.0–149.0)

## 2011-09-23 MED ORDER — SIMVASTATIN 20 MG PO TABS: 20.0000 mg | ORAL_TABLET | Freq: Every day | ORAL | Status: DC

## 2011-09-23 MED ORDER — POTASSIUM CHLORIDE CRYS ER 20 MEQ PO TBCR: 20.0000 meq | EXTENDED_RELEASE_TABLET | Freq: Every day | ORAL | Status: DC

## 2011-09-23 MED ORDER — FUROSEMIDE 40 MG PO TABS: 40.0000 mg | ORAL_TABLET | Freq: Every day | ORAL | Status: DC

## 2011-09-23 MED ORDER — METOPROLOL SUCCINATE ER 50 MG PO TB24: 50.0000 mg | ORAL_TABLET | Freq: Every day | ORAL | Status: DC

## 2011-09-23 MED ORDER — TRAMADOL HCL 50 MG PO TABS: 50.0000 mg | ORAL_TABLET | Freq: Four times a day (QID) | ORAL | Status: DC | PRN

## 2011-09-23 MED ORDER — NORTRIPTYLINE HCL 25 MG PO CAPS: 25.0000 mg | ORAL_CAPSULE | Freq: Every day | ORAL | Status: DC

## 2011-09-23 NOTE — Patient Instructions (Signed)
Limit your sodium (Salt) intake    It is important that you exercise regularly, at least 20 minutes 3 to 4 times per week.  If you develop chest pain or shortness of breath seek  medical attention.  You need to lose weight.  Consider a lower calorie diet and regular exercise.  Return in 3 months for follow-up  

## 2011-09-23 NOTE — Progress Notes (Signed)
Subjective:    Patient ID: [name], male    DOB: Apr 25, 1966, 46 y.o.   MRN: [id number]  HPI      CC: Pt here for physical..   History of Present Illness:   46 year-old patient who is seen today for a health maintenance examination. He was hospitalized in the spring and 2011on two occasions. In March., he was admitted for dysphasia, and underwent EDG which revealed an esophageal ring that required dilatation. He was hospitalized the following month due to a wide complex tachycardia thought secondary to ventricular tachycardia. He had a heart catheterization at that time, as well as a 2-D echocardiogram. He was discharged on metoprolol, which he no longer takes. Follow-up cardiology evaluation was not performed. He denies any history of palpitations or syncope.   Past Medical History  Diagnosis Date  . HTN (hypertension)   . Sarcoidosis   . Morbid obesity   . Glucose intolerance (impaired glucose tolerance)   . Uveitis   . Asthma   . Low back pain   . DJD (degenerative joint disease)   . Headache   . Depression   . GI bleed   . Helicobacter pylori gastritis   . CAD (coronary artery disease)     nonobstructive -- with LVH but preserved EF    History   Social History  . Marital Status: Divorced    Spouse Name: N/A    Number of Children: 2  . Years of Education: N/A   Occupational History  . DISABILITY    Social History Main Topics  . Smoking status: Never Smoker   . Smokeless tobacco: Never Used  . Alcohol Use: No  . Drug Use: Not on file  . Sexually Active: Not on file   Other Topics Concern  . Not on file   Social History Narrative  . No narrative on file    Past Surgical History  Procedure Date  . Cataract extraction     Family History  Problem Relation Age of Onset  . Stroke    . Cancer    . Coronary artery disease    . Diabetes      Allergies  Allergen Reactions  . Codeine     Current Outpatient Prescriptions on File Prior to Visit    Medication Sig Dispense Refill  . aspirin 81 MG tablet Take 81 mg by mouth daily.        . furosemide (LASIX) 40 MG tablet Take 1 tablet (40 mg total) by mouth daily.  30 tablet  3  . metoprolol succinate (TOPROL XL) 50 MG 24 hr tablet Take 1 tablet (50 mg total) by mouth daily.  90 tablet  11  . nortriptyline (PAMELOR) 25 MG capsule Take 1 capsule (25 mg total) by mouth at bedtime. Dr. Anne Hahn rx'ing  90 capsule  3  . potassium chloride SA (K-DUR,KLOR-CON) 20 MEQ tablet Take 1 tablet (20 mEq total) by mouth daily.  30 tablet  3  . simvastatin (ZOCOR) 20 MG tablet Take 1 tablet (20 mg total) by mouth at bedtime.  90 tablet  6  . traMADol (ULTRAM) 50 MG tablet Take 1 tablet (50 mg total) by mouth every 6 (six) hours as needed for pain.  20 tablet  4    BP 122/80  Pulse 78  Temp(Src) 97.6 F (36.4 C) (Oral)  Resp 20  Ht 5\' 11"  (1.803 m)  SpO2 95%     Here for Medicare AWV:   1. Risk factors  based on Past M, S, F history: risk factors include history of hypertension, morbid obesity, and family history. He also has a history of impaired glucose tolerance  2. Physical Activities: limited due to obesity  3. Depression/mood: history mild depression in the past, but no recent treatment  4. Hearing: no deficits  5. ADL's: independent in aspects of daily living  6. Fall Risk: moderate due to obesity  7. Home Safety: no problems identified  8. Height, weight, &visual acuity:height and weight stable. History of morbid obesity, blind right eye  9. Counseling: weight loss, more regular exercise, and heart healthy diet. All discussed  10. Labs ordered based on risk factors: will check a lipid profile, and hemoglobin A1c  11. Referral Coordination- will schedule for follow-up cardiology  12. Care Plan- will resume metoprolol  13. Cognitive Assessment- alert and oriented normal affect. No cognitive dysfunction   Allergies:  1) ! Codeine   Past History:  Past Medical History:   Hypertension   Sarcoidosis  FOB 08/11/05 pos ncg with airway involvemnt, steroid dep since 3/07  Mobid obesity, wt 340 when started prednisone  history glucose intolerance  ocular sarcoid ( uveitis)  Asthma  Low back pain  right knee, DJD  Headache  depression  upper GI bleed , dysphagia secondary esopohageal ring March 2011  H. pylori gastritis  h/o WCT 4-11   Past Surgical History:  status post left cataract surgery  cardiac cath 4-11  EGD 3-11   Family History:  Reviewed history from 06/14/2007 and no changes required.  father died age 55, history of cerebral vascular disease; history of laryngeal cancer  Mother, age 77 has coronary artery disease ; DJD, status post pacemaker insertion  Five brothers, positive for diabetes; one deceased, unknown causes   Social History:  Reviewed history from 06/14/2007 and no changes required.  Patient never smoked.  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  HENT: Negative for hearing loss, ear pain, congestion, rhinorrhea, sneezing, mouth sores, trouble swallowing, neck pain, neck stiffness, dental problem, voice change, sinus pressure and tinnitus.   Eyes: Negative for photophobia, pain, redness and visual disturbance.  Respiratory: Negative for apnea, cough, choking, chest tightness, shortness of breath and wheezing.   Cardiovascular: Positive for leg swelling. Negative for chest pain and palpitations.  Gastrointestinal: Negative for nausea, vomiting, abdominal pain, diarrhea, constipation, blood in stool, abdominal distention, anal bleeding and rectal pain.  Genitourinary: Negative for dysuria, urgency, frequency, hematuria, flank pain, decreased urine volume, discharge, penile swelling, scrotal swelling, difficulty urinating, genital sores and testicular pain.   Musculoskeletal: Positive for gait problem. Negative for myalgias, back pain, joint swelling and arthralgias (Right knee pain).  Skin: Negative for color change, rash and wound.  Neurological: Negative for dizziness, tremors, seizures, syncope, facial asymmetry, speech difficulty, weakness, light-headedness, numbness and headaches.  Hematological: Negative for adenopathy. Does not bruise/bleed easily.  Psychiatric/Behavioral: Negative for suicidal ideas, hallucinations, behavioral problems, confusion, sleep disturbance, self-injury, dysphoric mood, decreased concentration and agitation. The patient is not nervous/anxious.        Objective:   Physical Exam  Constitutional: He is oriented to person, place, and time. He appears well-developed.       Morbidly obese blood pressure 120/78 both arms  HENT:  Head: Normocephalic.  Right Ear: External ear normal.  Left Ear: External ear  normal.  Eyes: Conjunctivae and EOM are normal.       Status post bilateral cataract extractions  Neck: Normal range of motion.  Cardiovascular: Normal rate, regular rhythm, normal heart sounds and intact distal pulses.  Exam reveals no gallop.   No murmur heard. Pulmonary/Chest: Effort normal and breath sounds normal. No respiratory distress.  Abdominal: Soft. Bowel sounds are normal. He exhibits no distension. There is no tenderness. There is no rebound.       Marked obesity  Genitourinary: Penis normal.  Musculoskeletal: Normal range of motion. He exhibits edema and tenderness.       Right greater than left lower extremity edema  Slight tenderness right knee  Neurological: He is alert and oriented to person, place, and time.  Psychiatric: He has a normal mood and affect. His behavior is normal.          Assessment & Plan:   Preventive health examination Right knee pain. We'll check a x-ray Morbid obesity Hypertension stable History of ventricular tachycardia History of ocular sarcoid Chronic  venous insufficiency

## 2011-10-26 ENCOUNTER — Telehealth: Payer: Self-pay | Admitting: Family Medicine

## 2011-10-26 NOTE — Telephone Encounter (Signed)
We received notification that this pt did not go to Radiology and have the knee xray done that was ordered 09/23/11. Thank you.

## 2011-11-21 ENCOUNTER — Encounter (HOSPITAL_COMMUNITY): Payer: Self-pay | Admitting: *Deleted

## 2011-11-21 ENCOUNTER — Emergency Department (HOSPITAL_COMMUNITY)
Admission: EM | Admit: 2011-11-21 | Discharge: 2011-11-21 | Disposition: A | Discharge status: Home / Self Care | Payer: Medicare Other

## 2011-11-21 NOTE — ED Notes (Signed)
Pt called for room with no answer x2 15 min apart

## 2011-11-21 NOTE — ED Notes (Signed)
Patient states that he had onset of left ankle swelling x 2 weeks ago. Pt denies any injury.  Pt states that pain is getting worse at this time.

## 2011-11-25 ENCOUNTER — Encounter (HOSPITAL_COMMUNITY): Payer: Self-pay | Admitting: Emergency Medicine

## 2011-11-25 ENCOUNTER — Encounter (HOSPITAL_COMMUNITY): Payer: Self-pay

## 2011-11-25 ENCOUNTER — Emergency Department (HOSPITAL_COMMUNITY)
Admission: EM | Admit: 2011-11-25 | Discharge: 2011-11-25 | Discharge status: Against Medical Advice | Payer: Medicare Other | Attending: Emergency Medicine | Admitting: Emergency Medicine

## 2011-11-25 ENCOUNTER — Emergency Department (INDEPENDENT_AMBULATORY_CARE_PROVIDER_SITE_OTHER)
Admission: EM | Admit: 2011-11-25 | Discharge: 2011-11-25 | Disposition: A | Discharge status: Nursing Home (Includes ICF) | Payer: Medicare Other | Source: Home / Self Care | Attending: Family Medicine | Admitting: Family Medicine

## 2011-11-25 DIAGNOSIS — M79609 Pain in unspecified limb: Secondary | ICD-10-CM | POA: Diagnosis not present

## 2011-11-25 DIAGNOSIS — M79669 Pain in unspecified lower leg: Secondary | ICD-10-CM

## 2011-11-25 DIAGNOSIS — M7989 Other specified soft tissue disorders: Secondary | ICD-10-CM | POA: Diagnosis not present

## 2011-11-25 NOTE — ED Notes (Signed)
C/o pain and swelling to lt foot for 2 weeks , denies injury.

## 2011-11-25 NOTE — ED Notes (Signed)
C/o pain and swelling to L foot x 2 weeks.  No known injury.  Pt sent from [facility].

## 2011-11-25 NOTE — ED Notes (Signed)
Updated in w/r 

## 2011-11-25 NOTE — ED Provider Notes (Signed)
History     CSN: [id number]  Arrival date & time 11/25/11  1603   First MD Initiated Contact with Patient 11/25/11 1607      Chief Complaint  Patient presents with  . Foot Pain    (Consider location/radiation/quality/duration/timing/severity/associated sxs/prior treatment) HPI Comments: 46 y/o male morbidly obese with multiple co morbidities. Here c/o  Left foot swelling and left foot and lower leg pain for 2 weeks. Pain worse with putting weight on left leg. Taking tramadol with no relief. Denies recent car or air plane travel but he spends long time sitting due to body habitus. Denies known direct injury. No skin cuts.   Past Medical History  Diagnosis Date  . HTN (hypertension)   . Sarcoidosis   . Morbid obesity   . Glucose intolerance (impaired glucose tolerance)   . Uveitis   . Asthma   . Low back pain   . DJD (degenerative joint disease)   . Headache   . Depression   . GI bleed   . Helicobacter pylori gastritis   . CAD (coronary artery disease)     nonobstructive -- with LVH but preserved EF    Past Surgical History  Procedure Date  . Cataract extraction     Family History  Problem Relation Age of Onset  . Stroke    . Cancer    . Coronary artery disease    . Diabetes      History  Substance Use Topics  . Smoking status: Never Smoker   . Smokeless tobacco: Never Used  . Alcohol Use: No      Review of Systems  Constitutional: Negative for fever, chills and appetite change.  Respiratory: Negative for cough, chest tightness, shortness of breath and wheezing.   Cardiovascular: Positive for leg swelling. Negative for chest pain and palpitations.  Skin: Negative for rash.  Neurological: Negative for dizziness and headaches.    Allergies  Codeine  Home Medications   Current Outpatient Rx  Name Route Sig Dispense Refill  . ASPIRIN 81 MG PO TABS Oral Take 81 mg by mouth daily.      . FUROSEMIDE 40 MG PO TABS Oral Take 1 tablet (40 mg total) by  mouth daily. 30 tablet 3  . METOPROLOL SUCCINATE ER 50 MG PO TB24 Oral Take 1 tablet (50 mg total) by mouth daily. 90 tablet 11  . NORTRIPTYLINE HCL 25 MG PO CAPS Oral Take 1 capsule (25 mg total) by mouth at bedtime. Dr. Anne Hahn rx'ing 90 capsule 3  . POTASSIUM CHLORIDE CRYS ER 20 MEQ PO TBCR Oral Take 1 tablet (20 mEq total) by mouth daily. 30 tablet 3  . SIMVASTATIN 20 MG PO TABS Oral Take 1 tablet (20 mg total) by mouth at bedtime. 90 tablet 6  . TRAMADOL HCL 50 MG PO TABS Oral Take 1 tablet (50 mg total) by mouth every 6 (six) hours as needed for pain. 20 tablet 4    BP 158/96  Pulse 97  Temp 98.5 F (36.9 C) (Oral)  Resp 22  SpO2 99%  Physical Exam  Nursing note and vitals reviewed. Constitutional: He is oriented to person, place, and time. He appears well-developed and well-nourished. No distress.  HENT:  Head: Normocephalic and atraumatic.  Eyes: Pupils are equal, round, and reactive to light. No scleral icterus.  Neck: Neck supple. No JVD present.  Cardiovascular: Normal rate, regular rhythm and normal heart sounds.  Exam reveals no gallop.   Pulmonary/Chest: Effort normal and breath sounds normal.  No respiratory distress. He has no wheezes. He has no rales.  Musculoskeletal:       Left foot: toes feel cold but symmetric compared with right. No distal cyanosis. There is 2+ pitting edema in left foot dorsum from the base of the toes, also involves ankle, no bimalleolar tenderness and ankle laxity. There is trace to 1+ pitting pretibial edema with diffuse tibial tenderness. Reported calf tenderness with compression.  Faint tibial posterior and dorsal pedis pulses. Skin in left lower leg appears intact with no erythema, thickening, abrasions or lacerations. Patient limps from left leg when walking. Right foot and lower leg also with trace lower extremity edema but much less compares with left.  Neurological: He is alert and oriented to person, place, and time.  Skin: No rash  noted. He is not diaphoretic.    ED Course  Procedures (including critical care time)  Labs Reviewed - No data to display No results found.   1. Foot swelling   2. Lower leg pain       MDM  46 year old morbidly obese male with history of sarcoidosis, coronary artery disease and hypertension among other comorbidities. Here complaining of left food and lower leg pain and swelling for 2 weeks. On exam 1+ pitting edema in the left foot dorsum, pretibial trace swelling associated with calf and lower leg tenderness with pressure. Limping. Mild tachycardia and tachypnea with normal oxygen saturation, denied chest pain or respiratory symptoms. Concerning for DVT. Decided to transfer to the emergency department for further evaluation and management.         Sharin Grave, MD 11/27/11 1017

## 2012-01-23 ENCOUNTER — Ambulatory Visit: Payer: Medicare Other | Admitting: Internal Medicine

## 2012-02-03 ENCOUNTER — Ambulatory Visit (INDEPENDENT_AMBULATORY_CARE_PROVIDER_SITE_OTHER): Payer: Medicare Other | Admitting: Internal Medicine

## 2012-02-03 ENCOUNTER — Encounter: Payer: Self-pay | Admitting: Internal Medicine

## 2012-02-03 VITALS — BP 130/90 | Wt >= 6400 oz

## 2012-02-03 DIAGNOSIS — I10 Essential (primary) hypertension: Secondary | ICD-10-CM | POA: Diagnosis not present

## 2012-02-03 DIAGNOSIS — Z862 Personal history of diseases of the blood and blood-forming organs and certain disorders involving the immune mechanism: Secondary | ICD-10-CM

## 2012-02-03 DIAGNOSIS — Z8639 Personal history of other endocrine, nutritional and metabolic disease: Secondary | ICD-10-CM | POA: Diagnosis not present

## 2012-02-03 DIAGNOSIS — E785 Hyperlipidemia, unspecified: Secondary | ICD-10-CM | POA: Diagnosis not present

## 2012-02-03 MED ORDER — NORTRIPTYLINE HCL 25 MG PO CAPS: 25.0000 mg | ORAL_CAPSULE | Freq: Every day | ORAL | Status: DC

## 2012-02-03 MED ORDER — TRAMADOL HCL 50 MG PO TABS: 50.0000 mg | ORAL_TABLET | Freq: Four times a day (QID) | ORAL | Status: AC | PRN

## 2012-02-03 MED ORDER — METOPROLOL SUCCINATE ER 50 MG PO TB24: 50.0000 mg | ORAL_TABLET | Freq: Every day | ORAL | Status: DC

## 2012-02-03 MED ORDER — POTASSIUM CHLORIDE CRYS ER 20 MEQ PO TBCR: 20.0000 meq | EXTENDED_RELEASE_TABLET | Freq: Every day | ORAL | Status: DC

## 2012-02-03 MED ORDER — SIMVASTATIN 20 MG PO TABS: 20.0000 mg | ORAL_TABLET | Freq: Every day | ORAL | Status: DC

## 2012-02-03 MED ORDER — FUROSEMIDE 40 MG PO TABS: 40.0000 mg | ORAL_TABLET | Freq: Every day | ORAL | Status: DC

## 2012-02-03 NOTE — Patient Instructions (Signed)

## 2012-02-03 NOTE — Progress Notes (Signed)
Subjective:    Patient ID: [name], male    DOB: Sep 07, 1965, 46 y.o.   MRN: [id number]  HPI  46 year old patient who is seen today for followup. He has a history of treated hypertension dyslipidemia impaired glucose tolerance as well as morbid obesity. He is doing quite well. Weight is basically unchanged and approximately 400 pounds. Random blood sugar this afternoon 85  Past Medical History  Diagnosis Date  . HTN (hypertension)   . Sarcoidosis   . Morbid obesity   . Glucose intolerance (impaired glucose tolerance)   . Uveitis   . Asthma   . Low back pain   . DJD (degenerative joint disease)   . Headache   . Depression   . GI bleed   . Helicobacter pylori gastritis   . CAD (coronary artery disease)     nonobstructive -- with LVH but preserved EF    History   Social History  . Marital Status: Divorced    Spouse Name: N/A    Number of Children: 2  . Years of Education: N/A   Occupational History  . DISABILITY    Social History Main Topics  . Smoking status: Never Smoker   . Smokeless tobacco: Never Used  . Alcohol Use: No  . Drug Use: No  . Sexually Active: Not on file   Other Topics Concern  . Not on file   Social History Narrative  . No narrative on file    Past Surgical History  Procedure Date  . Cataract extraction     Family History  Problem Relation Age of Onset  . Stroke    . Cancer    . Coronary artery disease    . Diabetes      Allergies  Allergen Reactions  . Codeine     Current Outpatient Prescriptions on File Prior to Visit  Medication Sig Dispense Refill  . aspirin 81 MG tablet Take 81 mg by mouth daily.        . furosemide (LASIX) 40 MG tablet Take 1 tablet (40 mg total) by mouth daily.  30 tablet  3  . metoprolol succinate (TOPROL XL) 50 MG 24 hr tablet Take 1 tablet (50 mg total) by mouth daily.  90 tablet  11  . nortriptyline (PAMELOR) 25 MG capsule Take 1 capsule (25 mg total) by mouth at bedtime. Dr. Anne Hahn rx'ing  90  capsule  3  . potassium chloride SA (K-DUR,KLOR-CON) 20 MEQ tablet Take 1 tablet (20 mEq total) by mouth daily.  30 tablet  3  . simvastatin (ZOCOR) 20 MG tablet Take 1 tablet (20 mg total) by mouth at bedtime.  90 tablet  6  . traMADol (ULTRAM) 50 MG tablet Take 1 tablet (50 mg total) by mouth every 6 (six) hours as needed for pain.  20 tablet  4    BP 130/90  Wt 405 lb (183.707 kg)       Review of Systems  Constitutional: Negative for fever, chills, appetite change and fatigue.  HENT: Negative for hearing loss, ear pain, congestion, sore throat, trouble swallowing, neck stiffness, dental problem, voice change and tinnitus.   Eyes: Negative for pain, discharge and visual disturbance.  Respiratory: Negative for cough, chest tightness, wheezing and stridor.   Cardiovascular: Positive for leg swelling. Negative for chest pain and palpitations.  Gastrointestinal: Negative for nausea, vomiting, abdominal pain, diarrhea, constipation, blood in stool and abdominal distention.  Genitourinary: Negative for urgency, hematuria, flank pain, discharge, difficulty urinating and genital  sores.  Musculoskeletal: Negative for myalgias, back pain, joint swelling, arthralgias and gait problem.  Skin: Negative for rash.  Neurological: Negative for dizziness, syncope, speech difficulty, weakness, numbness and headaches.  Hematological: Negative for adenopathy. Does not bruise/bleed easily.  Psychiatric/Behavioral: Negative for behavioral problems and dysphoric mood. The patient is not nervous/anxious.        Objective:   Physical Exam  Constitutional: He is oriented to person, place, and time. He appears well-developed.       Weight 405 Repeat blood pressure 120/82  HENT:  Head: Normocephalic.  Right Ear: External ear normal.  Left Ear: External ear normal.  Eyes: Conjunctivae and EOM are normal.  Neck: Normal range of motion.  Cardiovascular: Normal rate and normal heart sounds.     Pulmonary/Chest: Breath sounds normal.  Abdominal: Bowel sounds are normal.  Musculoskeletal: Normal range of motion. He exhibits edema. He exhibits no tenderness.       +1 edema with some mild stasis dermatitis  Neurological: He is alert and oriented to person, place, and time.  Psychiatric: He has a normal mood and affect. His behavior is normal.          Assessment & Plan:   Hypertension well controlled Morbid obesity Anxiety depression stable Dyslipidemia  Medicines refilled  Recheck 4 months

## 2012-03-16 ENCOUNTER — Telehealth: Payer: Self-pay | Admitting: Internal Medicine

## 2012-03-16 DIAGNOSIS — H349 Unspecified retinal vascular occlusion: Secondary | ICD-10-CM

## 2012-03-16 DIAGNOSIS — H579 Unspecified disorder of eye and adnexa: Secondary | ICD-10-CM

## 2012-03-16 NOTE — Telephone Encounter (Signed)
Yes, to schedule carotid artery ultrasound as well as a 2-D echocardiogram. Confirm that patient is taking aspirin 81 mg daily. Schedule office visit next week. Notify patient to report to the ED if he has any recurrent symptoms over the weekend

## 2012-03-16 NOTE — Telephone Encounter (Signed)
Spoke with daughter and an appointment made.  Orders placed.

## 2012-03-16 NOTE — Telephone Encounter (Signed)
The pt has Amorosis Fujax. Does Dr Amador Cunas want to do an Echo or Carotid Eval, because pts eye exam was normal. Pts vision is coming an going and pt is having visual block outs. Pls advise.

## 2012-03-19 ENCOUNTER — Other Ambulatory Visit: Payer: Self-pay | Admitting: Internal Medicine

## 2012-03-19 DIAGNOSIS — H349 Unspecified retinal vascular occlusion: Secondary | ICD-10-CM

## 2012-03-19 DIAGNOSIS — H579 Unspecified disorder of eye and adnexa: Secondary | ICD-10-CM

## 2012-03-20 ENCOUNTER — Encounter: Payer: Self-pay | Admitting: Internal Medicine

## 2012-03-21 ENCOUNTER — Ambulatory Visit: Payer: Medicare Other | Admitting: Internal Medicine

## 2012-03-23 ENCOUNTER — Other Ambulatory Visit (HOSPITAL_COMMUNITY): Payer: Medicare Other

## 2012-03-27 ENCOUNTER — Ambulatory Visit: Payer: Medicare Other | Admitting: Internal Medicine

## 2012-03-28 ENCOUNTER — Other Ambulatory Visit: Payer: Self-pay | Admitting: Internal Medicine

## 2012-03-29 ENCOUNTER — Other Ambulatory Visit: Payer: Self-pay | Admitting: *Deleted

## 2012-03-29 DIAGNOSIS — H349 Unspecified retinal vascular occlusion: Secondary | ICD-10-CM

## 2012-03-30 ENCOUNTER — Ambulatory Visit (HOSPITAL_COMMUNITY): Payer: Medicare Other | Attending: Internal Medicine | Admitting: Radiology

## 2012-03-30 ENCOUNTER — Encounter (INDEPENDENT_AMBULATORY_CARE_PROVIDER_SITE_OTHER): Payer: Medicare Other

## 2012-03-30 ENCOUNTER — Encounter: Payer: Self-pay | Admitting: Internal Medicine

## 2012-03-30 ENCOUNTER — Ambulatory Visit (INDEPENDENT_AMBULATORY_CARE_PROVIDER_SITE_OTHER): Payer: Medicare Other | Admitting: Internal Medicine

## 2012-03-30 VITALS — BP 120/80 | Temp 98.1°F | Wt >= 6400 oz

## 2012-03-30 DIAGNOSIS — H53139 Sudden visual loss, unspecified eye: Secondary | ICD-10-CM | POA: Diagnosis not present

## 2012-03-30 DIAGNOSIS — Z23 Encounter for immunization: Secondary | ICD-10-CM

## 2012-03-30 DIAGNOSIS — D869 Sarcoidosis, unspecified: Secondary | ICD-10-CM | POA: Insufficient documentation

## 2012-03-30 DIAGNOSIS — H579 Unspecified disorder of eye and adnexa: Secondary | ICD-10-CM

## 2012-03-30 DIAGNOSIS — H349 Unspecified retinal vascular occlusion: Secondary | ICD-10-CM

## 2012-03-30 DIAGNOSIS — I2510 Atherosclerotic heart disease of native coronary artery without angina pectoris: Secondary | ICD-10-CM | POA: Insufficient documentation

## 2012-03-30 DIAGNOSIS — E785 Hyperlipidemia, unspecified: Secondary | ICD-10-CM | POA: Diagnosis not present

## 2012-03-30 DIAGNOSIS — H3400 Transient retinal artery occlusion, unspecified eye: Secondary | ICD-10-CM | POA: Insufficient documentation

## 2012-03-30 DIAGNOSIS — I10 Essential (primary) hypertension: Secondary | ICD-10-CM

## 2012-03-30 MED ORDER — ELETRIPTAN HYDROBROMIDE 40 MG PO TABS: ORAL_TABLET | ORAL | Status: DC

## 2012-03-30 NOTE — Progress Notes (Signed)
Subjective:    Patient ID: [name], male    DOB: 1965-06-11, 46 y.o.   MRN: [id number]  HPI  46 year old patient who has a history of hypertension dyslipidemia. He also has a history of CAD. Approximately 3 weeks ago he had an episode of transient visual loss. He was seen by ophthalmology and there was a concern for amaurosis fugax. He actually describes a long history of right hemifacial headaches. 3 weeks ago while watching television he had the sudden onset of total visual loss involving both eyes. This was abrupt much like a light switch turning the light off after approximately 10 minutes he had the abrupt onset of his vision returning.  Prior to this episode he was experiencing a right-sided headache. He is scheduled for carotid artery duplex and echocardiogram later today  Past Medical History  Diagnosis Date  . HTN (hypertension)   . Sarcoidosis   . Morbid obesity   . Glucose intolerance (impaired glucose tolerance)   . Uveitis   . Asthma   . Low back pain   . DJD (degenerative joint disease)   . Headache   . Depression   . GI bleed   . Helicobacter pylori gastritis   . CAD (coronary artery disease)     nonobstructive -- with LVH but preserved EF    History   Social History  . Marital Status: Divorced    Spouse Name: N/A    Number of Children: 2  . Years of Education: N/A   Occupational History  . DISABILITY    Social History Main Topics  . Smoking status: Never Smoker   . Smokeless tobacco: Never Used  . Alcohol Use: No  . Drug Use: No  . Sexually Active: Not on file   Other Topics Concern  . Not on file   Social History Narrative  . No narrative on file    Past Surgical History  Procedure Date  . Cataract extraction     Family History  Problem Relation Age of Onset  . Stroke    . Cancer    . Coronary artery disease    . Diabetes      Allergies  Allergen Reactions  . Codeine     Current Outpatient Prescriptions on File Prior to  Visit  Medication Sig Dispense Refill  . aspirin 81 MG tablet Take 81 mg by mouth daily.        . furosemide (LASIX) 40 MG tablet Take 1 tablet (40 mg total) by mouth daily.  30 tablet  3  . metoprolol succinate (TOPROL XL) 50 MG 24 hr tablet Take 1 tablet (50 mg total) by mouth daily.  90 tablet  11  . nortriptyline (PAMELOR) 25 MG capsule Take 1 capsule (25 mg total) by mouth at bedtime. Dr. Anne Hahn rx'ing  90 capsule  3  . potassium chloride SA (K-DUR,KLOR-CON) 20 MEQ tablet Take 1 tablet (20 mEq total) by mouth daily.  30 tablet  3  . simvastatin (ZOCOR) 20 MG tablet Take 1 tablet (20 mg total) by mouth at bedtime.  90 tablet  6  . traMADol (ULTRAM) 50 MG tablet Take 1 tablet (50 mg total) by mouth every 6 (six) hours as needed for pain.  60 tablet  4    BP 120/80  Temp 98.1 F (36.7 C) (Oral)  Wt 401 lb (181.892 kg)       Review of Systems  Constitutional: Negative for fever, chills, appetite change and fatigue.  HENT: Negative  for hearing loss, ear pain, congestion, sore throat, trouble swallowing, neck stiffness, dental problem, voice change and tinnitus.   Eyes: Positive for visual disturbance. Negative for pain and discharge.  Respiratory: Negative for cough, chest tightness, wheezing and stridor.   Cardiovascular: Negative for chest pain, palpitations and leg swelling.  Gastrointestinal: Negative for nausea, vomiting, abdominal pain, diarrhea, constipation, blood in stool and abdominal distention.  Genitourinary: Negative for urgency, hematuria, flank pain, discharge, difficulty urinating and genital sores.  Musculoskeletal: Negative for myalgias, back pain, joint swelling, arthralgias and gait problem.  Skin: Negative for rash.  Neurological: Positive for headaches. Negative for dizziness, syncope, speech difficulty, weakness and numbness.  Hematological: Negative for adenopathy. Does not bruise/bleed easily.  Psychiatric/Behavioral: Negative for behavioral problems and  dysphoric mood. The patient is not nervous/anxious.        Objective:   Physical Exam  Constitutional: He is oriented to person, place, and time. He appears well-developed.  HENT:  Head: Normocephalic.  Right Ear: External ear normal.  Left Ear: External ear normal.  Eyes: Conjunctivae normal and EOM are normal.  Neck: Normal range of motion.       Carotid upstroke normal without bruit  Cardiovascular: Normal rate, regular rhythm and normal heart sounds.   Pulmonary/Chest: Breath sounds normal.  Abdominal: Bowel sounds are normal.  Musculoskeletal: Normal range of motion. He exhibits no edema and no tenderness.  Neurological: He is alert and oriented to person, place, and time.  Psychiatric: He has a normal mood and affect. His behavior is normal.          Assessment & Plan:   Headache syndrome with episode of transient visual loss. The abrupt onset of bilateral vision loss does not suggest amaurosis fugax. Suspect this is a migraine syndrome Hypertension stable Dyslipidemia  Will proceed with carotid duplex and 2-D echocardiogram. We'll continue daily aspirin and aggressive risk factor modification Recheck 6 months or when necessary

## 2012-03-30 NOTE — Progress Notes (Signed)
Quick Note:  Attempt to call - pt not there  Then I called Helmut Muster (daughter) VM - LMTCB if questions - please let father know echo normal ______

## 2012-03-30 NOTE — Patient Instructions (Signed)
Limit your sodium (Salt) intake  You need to lose weight.  Consider a lower calorie diet and regular exercise.  Return in 6 months for follow-up   

## 2012-03-30 NOTE — Progress Notes (Signed)
Echocardiogram performed.  

## 2012-04-05 ENCOUNTER — Telehealth: Payer: Self-pay

## 2012-04-05 NOTE — Progress Notes (Signed)
Quick Note:  Attempt to call- HM # - VM - LMTCB - test normal  Attempt to call daughter Helmut Muster - could not leave msg - "phone turned off" ______

## 2012-04-05 NOTE — Telephone Encounter (Signed)
ATTEMPT TO CALL- HM# - vm - lmtcb IF QUESTIONS - TEST NORMAL  Attempt to call daughter Helmut Muster - "the customer you are reach is out of range or has phone turned off"

## 2012-06-04 ENCOUNTER — Ambulatory Visit (INDEPENDENT_AMBULATORY_CARE_PROVIDER_SITE_OTHER): Payer: Medicare Other | Admitting: Internal Medicine

## 2012-06-04 ENCOUNTER — Encounter: Payer: Self-pay | Admitting: Internal Medicine

## 2012-06-04 DIAGNOSIS — I472 Ventricular tachycardia, unspecified: Secondary | ICD-10-CM

## 2012-06-04 DIAGNOSIS — I10 Essential (primary) hypertension: Secondary | ICD-10-CM

## 2012-06-04 DIAGNOSIS — I517 Cardiomegaly: Secondary | ICD-10-CM | POA: Diagnosis not present

## 2012-06-04 DIAGNOSIS — Z8669 Personal history of other diseases of the nervous system and sense organs: Secondary | ICD-10-CM | POA: Insufficient documentation

## 2012-06-04 MED ORDER — LISINOPRIL 20 MG PO TABS: 20.0000 mg | ORAL_TABLET | Freq: Every day | ORAL | Status: DC

## 2012-06-04 NOTE — Patient Instructions (Signed)
Limit your sodium (Salt) intake  You need to lose weight.  Consider a lower calorie diet and regular exercise.  Please check your blood pressure on a regular basis.  If it is consistently greater than 150/90, please make an office appointment.  

## 2012-06-04 NOTE — Progress Notes (Signed)
Subjective:    Patient ID: [name], male    DOB: Aug 01, 1965, 47 y.o.   MRN: [id number]  HPI 47 year old patient who is seen today in followup. The patient was seen 2 months ago after an episode of transient visual loss. He describes abrupt bilateral and total visual loss lasted 10 minutes with slow return to normal function. He has a history of migraine headaches and visual loss was associated with severe bitemporal headaches. This was felt to be a migraine syndrome. He has seen Dr. Anne Hahn in the past and was placed on nortriptyline. Since his last visit here 2 months ago he has had no recurrent headaches and no visual loss He has hypertension. He had a follow 2-D echocardiogram revealed significant LVH. Carotid artery Doppler studies were normal  Past Medical History  Diagnosis Date  . HTN (hypertension)   . Sarcoidosis   . Morbid obesity   . Glucose intolerance (impaired glucose tolerance)   . Uveitis   . Asthma   . Low back pain   . DJD (degenerative joint disease)   . Headache   . Depression   . GI bleed   . Helicobacter pylori gastritis   . CAD (coronary artery disease)     nonobstructive -- with LVH but preserved EF    History   Social History  . Marital Status: Divorced    Spouse Name: N/A    Number of Children: 2  . Years of Education: N/A   Occupational History  . DISABILITY    Social History Main Topics  . Smoking status: Never Smoker   . Smokeless tobacco: Never Used  . Alcohol Use: No  . Drug Use: No  . Sexually Active: Not on file   Other Topics Concern  . Not on file   Social History Narrative  . No narrative on file    Past Surgical History  Procedure Date  . Cataract extraction     Family History  Problem Relation Age of Onset  . Stroke    . Cancer    . Coronary artery disease    . Diabetes      Allergies  Allergen Reactions  . Codeine     Current Outpatient Prescriptions on File Prior to Visit  Medication Sig Dispense  Refill  . aspirin 81 MG tablet Take 81 mg by mouth daily.        Marland Kitchen eletriptan (RELPAX) 40 MG tablet One tablet by mouth at onset of headache. May repeat in 2 hours if headache persists or recurs. 1 tablet by mouth at the onset of headache. May repeat in 4 hours if necessary. Did not take more than 2 per 24 hours  10 tablet  0  . furosemide (LASIX) 40 MG tablet Take 1 tablet (40 mg total) by mouth daily.  30 tablet  3  . metoprolol succinate (TOPROL XL) 50 MG 24 hr tablet Take 1 tablet (50 mg total) by mouth daily.  90 tablet  11  . nortriptyline (PAMELOR) 25 MG capsule Take 1 capsule (25 mg total) by mouth at bedtime. Dr. Anne Hahn rx'ing  90 capsule  3  . potassium chloride SA (K-DUR,KLOR-CON) 20 MEQ tablet Take 1 tablet (20 mEq total) by mouth daily.  30 tablet  3  . simvastatin (ZOCOR) 20 MG tablet Take 1 tablet (20 mg total) by mouth at bedtime.  90 tablet  6  . traMADol (ULTRAM) 50 MG tablet Take 1 tablet (50 mg total) by mouth every 6 (six)  hours as needed for pain.  60 tablet  4    BP 140/90  Pulse 92  Temp 98.8 F (37.1 C) (Oral)  Resp 18  Wt 407 lb 4.8 oz (184.75 kg)  SpO2 98%         Review of Systems  Constitutional: Negative for fever, chills, appetite change and fatigue.  HENT: Negative for hearing loss, ear pain, congestion, sore throat, trouble swallowing, neck stiffness, dental problem, voice change and tinnitus.   Eyes: Positive for visual disturbance. Negative for pain and discharge.  Respiratory: Negative for cough, chest tightness, wheezing and stridor.   Cardiovascular: Negative for chest pain, palpitations and leg swelling.  Gastrointestinal: Negative for nausea, vomiting, abdominal pain, diarrhea, constipation, blood in stool and abdominal distention.  Genitourinary: Negative for urgency, hematuria, flank pain, discharge, difficulty urinating and genital sores.  Musculoskeletal: Negative for myalgias, back pain, joint swelling, arthralgias and gait problem.  Skin:  Negative for rash.  Neurological: Positive for headaches. Negative for dizziness, syncope, speech difficulty, weakness and numbness.  Hematological: Negative for adenopathy. Does not bruise/bleed easily.  Psychiatric/Behavioral: Negative for behavioral problems and dysphoric mood. The patient is not nervous/anxious.        Objective:   Physical Exam  Constitutional: He is oriented to person, place, and time. He appears well-developed.       Weight 407 Blood pressure 140/90  HENT:  Head: Normocephalic.  Right Ear: External ear normal.  Left Ear: External ear normal.  Eyes: Conjunctivae normal and EOM are normal.  Neck: Normal range of motion.  Cardiovascular: Normal rate and normal heart sounds.   Pulmonary/Chest: Breath sounds normal.  Abdominal: Bowel sounds are normal.  Musculoskeletal: Normal range of motion. He exhibits no edema and no tenderness.  Neurological: He is alert and oriented to person, place, and time.  Psychiatric: He has a normal mood and affect. His behavior is normal.          Assessment & Plan:  Hypertension with significant LVH. Will add the lisinopril 10 mg daily to his regimen and recheck in 6 weeks Migraine headaches. Believed that episode of transient visual loss a migraine equivalent Nonobstructive coronary artery disease and history of VT

## 2012-07-16 ENCOUNTER — Ambulatory Visit: Payer: Medicare Other | Admitting: Internal Medicine

## 2012-07-16 DIAGNOSIS — Z0289 Encounter for other administrative examinations: Secondary | ICD-10-CM

## 2012-12-04 DIAGNOSIS — H2000 Unspecified acute and subacute iridocyclitis: Secondary | ICD-10-CM | POA: Diagnosis not present

## 2012-12-04 DIAGNOSIS — H43819 Vitreous degeneration, unspecified eye: Secondary | ICD-10-CM | POA: Diagnosis not present

## 2012-12-18 ENCOUNTER — Other Ambulatory Visit: Payer: Medicare Other

## 2012-12-25 ENCOUNTER — Encounter: Payer: Medicare Other | Admitting: Internal Medicine

## 2012-12-25 DIAGNOSIS — Z0289 Encounter for other administrative examinations: Secondary | ICD-10-CM

## 2013-04-01 ENCOUNTER — Telehealth: Payer: Self-pay | Admitting: Internal Medicine

## 2013-04-01 MED ORDER — METOPROLOL SUCCINATE ER 50 MG PO TB24: 50.0000 mg | ORAL_TABLET | Freq: Every day | ORAL | Status: DC

## 2013-04-01 MED ORDER — LISINOPRIL 20 MG PO TABS: 20.0000 mg | ORAL_TABLET | Freq: Every day | ORAL | Status: DC

## 2013-04-01 MED ORDER — POTASSIUM CHLORIDE CRYS ER 20 MEQ PO TBCR: 20.0000 meq | EXTENDED_RELEASE_TABLET | Freq: Every day | ORAL | Status: DC

## 2013-04-01 MED ORDER — SIMVASTATIN 20 MG PO TABS: 20.0000 mg | ORAL_TABLET | Freq: Every day | ORAL | Status: DC

## 2013-04-01 MED ORDER — FUROSEMIDE 40 MG PO TABS: 40.0000 mg | ORAL_TABLET | Freq: Every day | ORAL | Status: DC

## 2013-04-01 MED ORDER — ELETRIPTAN HYDROBROMIDE 40 MG PO TABS: ORAL_TABLET | ORAL | Status: DC

## 2013-04-01 NOTE — Telephone Encounter (Signed)
Pt needs cpe as soon as possible due to being out of all his meds. No cpe appt until jan.  pls advise. Pt is out all these meds : eletriptan (RELPAX) 40 MG tablet furosemide (LASIX) 40 MG tablet lisinopril (PRINIVIL,ZESTRIL) 20 MG tablet metoprolol succinate (TOPROL XL) 50 MG 24 hr tablet potassium chloride SA (K-DUR,KLOR-CON) 20 MEQ tablet [facility]

## 2013-04-01 NOTE — Telephone Encounter (Signed)
Please schedule pt CPE in Jan. I will refill meds till then.

## 2013-04-02 NOTE — Telephone Encounter (Signed)
appt scheduled/ can you refill?

## 2013-04-02 NOTE — Telephone Encounter (Signed)
Spoke to pt told him Rx refills sent to pharmacy enough till appointment in Jan. Pt verbalized understanding.

## 2013-06-26 ENCOUNTER — Encounter: Payer: Medicare Other | Admitting: Internal Medicine

## 2013-07-08 ENCOUNTER — Ambulatory Visit (INDEPENDENT_AMBULATORY_CARE_PROVIDER_SITE_OTHER): Payer: Medicare Other | Admitting: Internal Medicine

## 2013-07-08 ENCOUNTER — Encounter: Payer: Self-pay | Admitting: Internal Medicine

## 2013-07-08 VITALS — BP 150/90 | HR 99 | Temp 97.9°F | Resp 20 | Ht 70.0 in | Wt >= 6400 oz

## 2013-07-08 DIAGNOSIS — Z0000 Encounter for general adult medical examination without abnormal findings: Secondary | ICD-10-CM

## 2013-07-08 DIAGNOSIS — I517 Cardiomegaly: Secondary | ICD-10-CM

## 2013-07-08 DIAGNOSIS — Z23 Encounter for immunization: Secondary | ICD-10-CM | POA: Diagnosis not present

## 2013-07-08 DIAGNOSIS — I2510 Atherosclerotic heart disease of native coronary artery without angina pectoris: Secondary | ICD-10-CM | POA: Diagnosis not present

## 2013-07-08 DIAGNOSIS — D869 Sarcoidosis, unspecified: Secondary | ICD-10-CM

## 2013-07-08 DIAGNOSIS — Z862 Personal history of diseases of the blood and blood-forming organs and certain disorders involving the immune mechanism: Secondary | ICD-10-CM | POA: Diagnosis not present

## 2013-07-08 DIAGNOSIS — E785 Hyperlipidemia, unspecified: Secondary | ICD-10-CM | POA: Diagnosis not present

## 2013-07-08 DIAGNOSIS — Z8639 Personal history of other endocrine, nutritional and metabolic disease: Secondary | ICD-10-CM | POA: Diagnosis not present

## 2013-07-08 DIAGNOSIS — Z87438 Personal history of other diseases of male genital organs: Secondary | ICD-10-CM

## 2013-07-08 DIAGNOSIS — I10 Essential (primary) hypertension: Secondary | ICD-10-CM | POA: Diagnosis not present

## 2013-07-08 DIAGNOSIS — J45909 Unspecified asthma, uncomplicated: Secondary | ICD-10-CM

## 2013-07-08 DIAGNOSIS — Z87898 Personal history of other specified conditions: Secondary | ICD-10-CM

## 2013-07-08 LAB — CBC WITH DIFFERENTIAL/PLATELET
BASOS ABS: 0 10*3/uL (ref 0.0–0.1)
Basophils Relative: 0.4 % (ref 0.0–3.0)
EOS ABS: 0.2 10*3/uL (ref 0.0–0.7)
Eosinophils Relative: 2.6 % (ref 0.0–5.0)
HEMATOCRIT: 41.2 % (ref 39.0–52.0)
Hemoglobin: 13 g/dL (ref 13.0–17.0)
LYMPHS ABS: 1.8 10*3/uL (ref 0.7–4.0)
Lymphocytes Relative: 23.2 % (ref 12.0–46.0)
MCHC: 31.5 g/dL (ref 30.0–36.0)
MCV: 96.9 fl (ref 78.0–100.0)
MONO ABS: 0.8 10*3/uL (ref 0.1–1.0)
Monocytes Relative: 9.9 % (ref 3.0–12.0)
Neutro Abs: 4.9 10*3/uL (ref 1.4–7.7)
Neutrophils Relative %: 63.9 % (ref 43.0–77.0)
PLATELETS: 162 10*3/uL (ref 150.0–400.0)
RBC: 4.25 Mil/uL (ref 4.22–5.81)
RDW: 13.3 % (ref 11.5–14.6)
WBC: 7.7 10*3/uL (ref 4.5–10.5)

## 2013-07-08 LAB — COMPREHENSIVE METABOLIC PANEL
ALBUMIN: 3.3 g/dL — AB (ref 3.5–5.2)
ALK PHOS: 59 U/L (ref 39–117)
ALT: 13 U/L (ref 0–53)
AST: 17 U/L (ref 0–37)
BILIRUBIN TOTAL: 0.7 mg/dL (ref 0.3–1.2)
BUN: 18 mg/dL (ref 6–23)
CO2: 28 mEq/L (ref 19–32)
Calcium: 8.8 mg/dL (ref 8.4–10.5)
Chloride: 101 mEq/L (ref 96–112)
Creatinine, Ser: 1 mg/dL (ref 0.4–1.5)
GFR: 102.87 mL/min (ref >60.00)
Glucose, Bld: 96 mg/dL (ref 70–99)
POTASSIUM: 4.5 meq/L (ref 3.5–5.1)
Sodium: 138 mEq/L (ref 135–145)
TOTAL PROTEIN: 8.2 g/dL (ref 6.0–8.3)

## 2013-07-08 LAB — LIPID PANEL
CHOL/HDL RATIO: 4
CHOLESTEROL: 167 mg/dL (ref 0–200)
HDL: 43.9 mg/dL (ref >39.00)
LDL CALC: 114 mg/dL — AB (ref 0–99)
Triglycerides: 47 mg/dL (ref 0.0–149.0)
VLDL: 9.4 mg/dL (ref 0.0–40.0)

## 2013-07-08 LAB — TSH: TSH: 2 u[IU]/mL (ref 0.35–5.50)

## 2013-07-08 LAB — HEMOGLOBIN A1C: Hgb A1c MFr Bld: 5.4 % (ref 4.6–6.5)

## 2013-07-08 LAB — GLUCOSE, POCT (MANUAL RESULT ENTRY): POC Glucose: 103 mg/dl — AB (ref 70–99)

## 2013-07-08 MED ORDER — ELETRIPTAN HYDROBROMIDE 40 MG PO TABS: ORAL_TABLET | ORAL | Status: DC

## 2013-07-08 MED ORDER — NORTRIPTYLINE HCL 25 MG PO CAPS: 25.0000 mg | ORAL_CAPSULE | Freq: Every day | ORAL | Status: DC

## 2013-07-08 MED ORDER — POTASSIUM CHLORIDE CRYS ER 20 MEQ PO TBCR: 20.0000 meq | EXTENDED_RELEASE_TABLET | Freq: Every day | ORAL | Status: DC

## 2013-07-08 MED ORDER — SIMVASTATIN 20 MG PO TABS: 20.0000 mg | ORAL_TABLET | Freq: Every day | ORAL | Status: DC

## 2013-07-08 MED ORDER — METOPROLOL SUCCINATE ER 50 MG PO TB24: 50.0000 mg | ORAL_TABLET | Freq: Every day | ORAL | Status: DC

## 2013-07-08 MED ORDER — LISINOPRIL 20 MG PO TABS: 20.0000 mg | ORAL_TABLET | Freq: Every day | ORAL | Status: DC

## 2013-07-08 MED ORDER — FUROSEMIDE 40 MG PO TABS: 40.0000 mg | ORAL_TABLET | Freq: Every day | ORAL | Status: DC

## 2013-07-08 NOTE — Progress Notes (Signed)
Subjective:    Patient ID: [name], male    DOB: 1965-08-16, 48 y.o.   MRN: [id number]  HPI       CC: Pt here for physical..   History of Present Illness:   48  year-old patient who is seen today for a health maintenance examination. He was hospitalized in the spring of 2011on two occasions. In March., he was admitted for dysphasia, and underwent EDG which revealed an esophageal ring that required dilatation. He was hospitalized the following month due to a wide complex tachycardia thought secondary to ventricular tachycardia. He had a heart catheterization at that time, as well as a 2-D echocardiogram. He was discharged on metoprolol, which he no longer takes. He has been out of all his medications since November 2014.  Follow-up cardiology evaluation was not performed. He denies any history of palpitations or syncope.   He states that he is now living with a son and daughter who are cooking for the patient and he is on a much better diet.  Past Medical History  Diagnosis Date  . HTN (hypertension)   . Sarcoidosis   . Morbid obesity   . Glucose intolerance (impaired glucose tolerance)   . Uveitis   . Asthma   . Low back pain   . DJD (degenerative joint disease)   . Headache(784.0)   . Depression   . GI bleed   . Helicobacter pylori gastritis   . CAD (coronary artery disease)     nonobstructive -- with LVH but preserved EF    History   Social History  . Marital Status: Divorced    Spouse Name: N/A    Number of Children: 2  . Years of Education: N/A   Occupational History  . DISABILITY    Social History Main Topics  . Smoking status: Never Smoker   . Smokeless tobacco: Never Used  . Alcohol Use: No  . Drug Use: No  . Sexual Activity: Not on file   Other Topics Concern  . Not on file   Social History Narrative  . No narrative on file    Past Surgical History  Procedure Laterality Date  . Cataract extraction      Family History  Problem Relation  Age of Onset  . Stroke    . Cancer    . Coronary artery disease    . Diabetes      Allergies  Allergen Reactions  . Codeine     Current Outpatient Prescriptions on File Prior to Visit  Medication Sig Dispense Refill  . aspirin 81 MG tablet Take 81 mg by mouth daily.         No current facility-administered medications on file prior to visit.    BP 150/90  Pulse 99  Temp(Src) 97.9 F (36.6 C) (Oral)  Resp 20  Ht 5\' 10"  (1.778 m)  Wt 461 lb 4.8 oz (209.244 kg)  BMI 66.19 kg/m2  SpO2 96%     Here for Medicare AWV:   1. Risk factors based on Past M, S, F history: risk factors include history of hypertension, morbid obesity, and family history. He also has a history of impaired glucose tolerance  2. Physical Activities: limited due to obesity  3. Depression/mood: history mild depression in the past, but no recent treatment  4. Hearing: no deficits  5. ADL's: independent in aspects of daily living  6. Fall Risk: moderate due to obesity  7. Home Safety: no problems identified  8. Height, weight, &  visual acuity:height and weight stable. History of morbid obesity, blind right eye  9. Counseling: weight loss, more regular exercise, and heart healthy diet. All discussed  10. Labs ordered based on risk factors: will check a lipid profile, and hemoglobin A1c  11. Referral Coordination- will schedule for follow-up cardiology  12. Care Plan- will resume metoprolol and all  his medications 13. Cognitive Assessment- alert and oriented normal affect. No cognitive dysfunction   Allergies:  1) ! Codeine   Past History:  Past Medical History:   Hypertension  Sarcoidosis  FOB 08/11/05 pos ncg with airway involvemnt, steroid dep since 3/07  Mobid obesity, wt 340 when started prednisone  history glucose intolerance  ocular sarcoid ( uveitis)  blind right eye Asthma  Low back pain  right knee, DJD  Headache  depression  upper GI bleed , dysphagia secondary esopohageal ring  March 2011  H. pylori gastritis  h/o WCT 4-11   Past Surgical History:  status post left cataract surgery  cardiac cath 4-11  EGD 3-11   Family History:   father died age 50, history of cerebral vascular disease; history of laryngeal cancer  Mother, age 50 has coronary artery disease ; DJD, status post pacemaker insertion  Five brothers, positive for diabetes; one deceased, unknown causes   Social History:   Patient never smoked.  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  HENT: Negative for congestion, dental problem, ear pain, hearing loss, mouth sores, rhinorrhea, sinus pressure, sneezing, tinnitus, trouble swallowing and voice change.   Eyes: Negative for photophobia, pain, redness and visual disturbance.       Blind right eye  Respiratory: Negative for apnea, cough, choking, chest tightness, shortness of breath and wheezing.   Cardiovascular: Positive for leg swelling. Negative for chest pain and palpitations.  Gastrointestinal: Negative for nausea, vomiting, abdominal pain, diarrhea, constipation, blood in stool, abdominal distention, anal bleeding and rectal pain.  Genitourinary: Negative for dysuria, urgency, frequency, hematuria, flank pain, decreased urine volume, discharge, penile swelling, scrotal swelling, difficulty urinating, genital sores and testicular pain.  Musculoskeletal: Positive for gait problem. Negative for arthralgias (Right knee pain), back pain, joint swelling, myalgias, neck pain and neck stiffness.  Skin: Negative for color change, rash and wound.  Neurological: Negative for dizziness, tremors, seizures, syncope, facial asymmetry, speech difficulty, weakness, light-headedness, numbness and headaches.  Hematological: Negative for adenopathy. Does not  bruise/bleed easily.  Psychiatric/Behavioral: Negative for suicidal ideas, hallucinations, behavioral problems, confusion, sleep disturbance, self-injury, dysphoric mood, decreased concentration and agitation. The patient is not nervous/anxious.        Objective:   Physical Exam  Constitutional: He is oriented to person, place, and time. He appears well-developed.  Morbidly obese blood pressure 150/90 Weight 461  HENT:  Head: Normocephalic.  Right Ear: External ear normal.  Left Ear: External ear normal.  Eyes: Conjunctivae and EOM are normal.  Status post bilateral cataract extractions  Neck: Normal range of motion.  Cardiovascular: Normal rate, regular rhythm, normal heart sounds and intact distal pulses.  Exam reveals no gallop.   No murmur heard. Pulmonary/Chest: Effort normal and breath sounds normal. No respiratory distress.  Abdominal: Soft. Bowel sounds are normal. He exhibits no distension. There is no tenderness. There is no rebound.  Marked obesity  Genitourinary: Penis normal.  Musculoskeletal: Normal range of motion. He exhibits edema and tenderness.  Trace lower extremity edema with dry skin    Neurological: He is alert and oriented to person, place, and time.  Decreased vibratory sensation and monofilament testing left foot  Psychiatric: He has a normal mood and affect. His behavior is normal.          Assessment & Plan:   Preventive health examination  Morbid obesity Hypertension- blood pressure  high today off medications. Will resume medications History of ventricular tachycardia. Will resume metoprolol  History of ocular sarcoid Chronic venous insufficiency Dyslipidemia.  resume statin therapy Nonobstructive CAD. We'll continue efforts at aggressive risk factor modification

## 2013-07-08 NOTE — Patient Instructions (Signed)
Limit your sodium (Salt) intake    It is important that you exercise regularly, at least 20 minutes 3 to 4 times per week.  If you develop chest pain or shortness of breath seek  medical attention.  You need to lose weight.  Consider a lower calorie diet and regular exercise.  Return in 6 months for follow-up  Please check your blood pressure on a regular basis.  If it is consistently greater than 150/90, please make an office appointment.   

## 2013-07-08 NOTE — Progress Notes (Signed)
   Subjective:    Patient ID: [name], male    DOB: 01/30/1966, 48 y.o.   MRN: [id number]  HPI    Review of Systems     Objective:   Physical Exam        Assessment & Plan:

## 2013-07-08 NOTE — Progress Notes (Signed)
Pre-visit discussion using our clinic review tool. No additional management support is needed unless otherwise documented below in the visit note.  

## 2013-07-09 ENCOUNTER — Telehealth: Payer: Self-pay | Admitting: Internal Medicine

## 2013-07-09 NOTE — Telephone Encounter (Signed)
Relevant patient education mailed to patient.  

## 2014-01-06 ENCOUNTER — Ambulatory Visit (INDEPENDENT_AMBULATORY_CARE_PROVIDER_SITE_OTHER): Payer: Medicare Other | Admitting: Internal Medicine

## 2014-01-06 ENCOUNTER — Encounter: Payer: Self-pay | Admitting: Internal Medicine

## 2014-01-06 ENCOUNTER — Other Ambulatory Visit: Payer: Self-pay | Admitting: *Deleted

## 2014-01-06 VITALS — BP 160/90 | HR 93 | Resp 20 | Ht 70.0 in | Wt >= 6400 oz

## 2014-01-06 DIAGNOSIS — I10 Essential (primary) hypertension: Secondary | ICD-10-CM | POA: Diagnosis not present

## 2014-01-06 DIAGNOSIS — I4729 Other ventricular tachycardia: Secondary | ICD-10-CM | POA: Diagnosis not present

## 2014-01-06 DIAGNOSIS — E785 Hyperlipidemia, unspecified: Secondary | ICD-10-CM

## 2014-01-06 DIAGNOSIS — D869 Sarcoidosis, unspecified: Secondary | ICD-10-CM

## 2014-01-06 DIAGNOSIS — I2510 Atherosclerotic heart disease of native coronary artery without angina pectoris: Secondary | ICD-10-CM

## 2014-01-06 DIAGNOSIS — R600 Localized edema: Secondary | ICD-10-CM

## 2014-01-06 DIAGNOSIS — R609 Edema, unspecified: Secondary | ICD-10-CM

## 2014-01-06 DIAGNOSIS — I472 Ventricular tachycardia: Secondary | ICD-10-CM | POA: Diagnosis not present

## 2014-01-06 MED ORDER — METOPROLOL SUCCINATE ER 50 MG PO TB24: 50.0000 mg | ORAL_TABLET | Freq: Every day | ORAL | Status: DC

## 2014-01-06 MED ORDER — LISINOPRIL 20 MG PO TABS: 20.0000 mg | ORAL_TABLET | Freq: Every day | ORAL | Status: DC

## 2014-01-06 NOTE — Patient Instructions (Addendum)
Limit your sodium (Salt) intake  Return in 6 months for follow-up Low-Sodium Eating Plan Sodium raises blood pressure and causes water to be held in the body. Getting less sodium from food will help lower your blood pressure, reduce any swelling, and protect your heart, liver, and kidneys. We get sodium by adding salt (sodium chloride) to food. Most of our sodium comes from canned, boxed, and frozen foods. Restaurant foods, fast foods, and pizza are also very high in sodium. Even if you take medicine to lower your blood pressure or to reduce fluid in your body, getting less sodium from your food is important. WHAT IS MY PLAN? Most people should limit their sodium intake to 2,300 mg a day. Your health care provider recommends that you limit your sodium intake to __________ a day.  WHAT DO I NEED TO KNOW ABOUT THIS EATING PLAN? For the low-sodium eating plan, you will follow these general guidelines:  Choose foods with a % Daily Value for sodium of less than 5% (as listed on the food label).   Use salt-free seasonings or herbs instead of table salt or sea salt.   Check with your health care provider or pharmacist before using salt substitutes.   Eat fresh foods.  Eat more vegetables and fruits.  Limit canned vegetables. If you do use them, rinse them well to decrease the sodium.   Limit cheese to 1 oz (28 g) per day.   Eat lower-sodium products, often labeled as "lower sodium" or "no salt added."  Avoid foods that contain monosodium glutamate (MSG). MSG is sometimes added to [place] food and some canned foods.  Check food labels (Nutrition Facts labels) on foods to learn how much sodium is in one serving.  Eat more home-cooked food and less restaurant, buffet, and fast food.  When eating at a restaurant, ask that your food be prepared with less salt or none, if possible.  HOW DO I READ FOOD LABELS FOR SODIUM INFORMATION? The Nutrition Facts label lists the amount of sodium  in one serving of the food. If you eat more than one serving, you must multiply the listed amount of sodium by the number of servings. Food labels may also identify foods as:  Sodium free--Less than 5 mg in a serving.  Very low sodium--35 mg or less in a serving.  Low sodium--140 mg or less in a serving.  Light in sodium--50% less sodium in a serving. For example, if a food that usually has 300 mg of sodium is changed to become light in sodium, it will have 150 mg of sodium.  Reduced sodium--25% less sodium in a serving. For example, if a food that usually has 400 mg of sodium is changed to reduced sodium, it will have 300 mg of sodium. WHAT FOODS CAN I EAT? Grains Low-sodium cereals, including oats, puffed wheat and rice, and shredded wheat cereals. Low-sodium crackers. Unsalted rice and pasta. Lower-sodium bread.  Vegetables Frozen or fresh vegetables. Low-sodium or reduced-sodium canned vegetables. Low-sodium or reduced-sodium tomato sauce and paste. Low-sodium or reduced-sodium tomato and vegetable juices.  Fruits Fresh, frozen, and canned fruit. Fruit juice.  Meat and Other Protein Products Low-sodium canned tuna and salmon. Fresh or frozen meat, poultry, seafood, and fish. Lamb. Unsalted nuts. Dried beans, peas, and lentils without added salt. Unsalted canned beans. Homemade soups without salt. Eggs.  Dairy Milk. Soy milk. Ricotta cheese. Low-sodium or reduced-sodium cheeses. Yogurt.  Condiments Fresh and dried herbs and spices. Salt-free seasonings. Onion and garlic powders.  Low-sodium varieties of mustard and ketchup. Lemon juice.  Fats and Oils Reduced-sodium salad dressings. Unsalted butter.  Other Unsalted popcorn and pretzels.  The items listed above may not be a complete list of recommended foods or beverages. Contact your dietitian for more options. WHAT FOODS ARE NOT RECOMMENDED? Grains Instant hot cereals. Bread stuffing, pancake, and biscuit mixes.  Croutons. Seasoned rice or pasta mixes. Noodle soup cups. Boxed or frozen macaroni and cheese. Self-rising flour. Regular salted crackers. Vegetables Regular canned vegetables. Regular canned tomato sauce and paste. Regular tomato and vegetable juices. Frozen vegetables in sauces. Salted french fries. Olives. Rosita Fire. Relishes. Sauerkraut. Salsa. Meat and Other Protein Products Salted, canned, smoked, spiced, or pickled meats, seafood, or fish. Bacon, ham, sausage, hot dogs, corned beef, chipped beef, and packaged luncheon meats. Salt pork. Jerky. Pickled herring. Anchovies, regular canned tuna, and sardines. Salted nuts. Dairy Processed cheese and cheese spreads. Cheese curds. Blue cheese and cottage cheese. Buttermilk.  Condiments Onion and garlic salt, seasoned salt, table salt, and sea salt. Canned and packaged gravies. Worcestershire sauce. Tartar sauce. Barbecue sauce. Teriyaki sauce. Soy sauce, including reduced sodium. Steak sauce. Fish sauce. Oyster sauce. Cocktail sauce. Horseradish. Regular ketchup and mustard. Meat flavorings and tenderizers. Bouillon cubes. Hot sauce. Tabasco sauce. Marinades. Taco seasonings. Relishes. Fats and Oils Regular salad dressings. Salted butter. Margarine. Ghee. Bacon fat.  Other Potato and tortilla chips. Corn chips and puffs. Salted popcorn and pretzels. Canned or dried soups. Pizza. Frozen entrees and pot pies.  The items listed above may not be a complete list of foods and beverages to avoid. Contact your dietitian for more information. Document Released: 11/05/2001 Document Revised: 05/21/2013 Document Reviewed: 03/20/2013 [facility] Patient Information 2015 [place], [place]. This information is not intended to replace advice given to you by your health care provider. Make sure you discuss any questions you have with your health care provider.

## 2014-01-06 NOTE — Progress Notes (Signed)
Subjective:    Patient ID: [name], male    DOB: July 10, 1965, 48 y.o.   MRN: [id number]  HPI   48 year old patient who is in today for his biannual followup.  Medical problems include sarcoidosis, dyslipidemia, morbid obesity, and treated hypertension.  He has done well over the past 6 months, but has noticed some increasing lower extremity edema.  The left leg is affected slightly more than the right. He's had no headaches. No complaints of low back pain.  Remains on simvastatin for dyslipidemia. Medical regimen includes Furosemide 40 mg daily  Past Medical History  Diagnosis Date  . HTN (hypertension)   . Sarcoidosis   . Morbid obesity   . Glucose intolerance (impaired glucose tolerance)   . Uveitis   . Asthma   . Low back pain   . DJD (degenerative joint disease)   . Headache(784.0)   . Depression   . GI bleed   . Helicobacter pylori gastritis   . CAD (coronary artery disease)     nonobstructive -- with LVH but preserved EF    History   Social History  . Marital Status: Divorced    Spouse Name: N/A    Number of Children: 2  . Years of Education: N/A   Occupational History  . DISABILITY    Social History Main Topics  . Smoking status: Never Smoker   . Smokeless tobacco: Never Used  . Alcohol Use: No  . Drug Use: No  . Sexual Activity: Not on file   Other Topics Concern  . Not on file   Social History Narrative  . No narrative on file    Past Surgical History  Procedure Laterality Date  . Cataract extraction      Family History  Problem Relation Age of Onset  . Stroke    . Cancer    . Coronary artery disease    . Diabetes      Allergies  Allergen Reactions  . Codeine     Current Outpatient Prescriptions on File Prior to Visit  Medication Sig Dispense Refill  . aspirin 81 MG tablet Take 81 mg by mouth daily.        Marland Kitchen eletriptan (RELPAX) 40 MG tablet 1 tablet by mouth at the onset of headache. May repeat in 4 hours if necessary. Did  not take more than 2 per 24 hours  10 tablet  2  . furosemide (LASIX) 40 MG tablet Take 1 tablet (40 mg total) by mouth daily.  90 tablet  3  . lisinopril (PRINIVIL,ZESTRIL) 20 MG tablet Take 1 tablet (20 mg total) by mouth daily.  90 tablet  1  . metoprolol succinate (TOPROL XL) 50 MG 24 hr tablet Take 1 tablet (50 mg total) by mouth daily.  90 tablet  1  . nortriptyline (PAMELOR) 25 MG capsule Take 1 capsule (25 mg total) by mouth at bedtime. Dr. Anne Hahn rx'ing  90 capsule  3  . potassium chloride SA (K-DUR,KLOR-CON) 20 MEQ tablet Take 1 tablet (20 mEq total) by mouth daily.  90 tablet  3  . simvastatin (ZOCOR) 20 MG tablet Take 1 tablet (20 mg total) by mouth at bedtime.  90 tablet  3   No current facility-administered medications on file prior to visit.    BP 160/90  Pulse 93  Resp 20  Ht 5\' 10"  (1.778 m)  Wt 446 lb (202.304 kg)  BMI 63.99 kg/m2  SpO2 95%    Review of Systems  Constitutional:  Negative for fever, chills, appetite change and fatigue.  HENT: Negative for congestion, dental problem, ear pain, hearing loss, sore throat, tinnitus, trouble swallowing and voice change.   Eyes: Negative for pain, discharge and visual disturbance.  Respiratory: Negative for cough, chest tightness, wheezing and stridor.   Cardiovascular: Positive for leg swelling. Negative for chest pain and palpitations.  Gastrointestinal: Negative for nausea, vomiting, abdominal pain, diarrhea, constipation, blood in stool and abdominal distention.  Genitourinary: Negative for urgency, hematuria, flank pain, discharge, difficulty urinating and genital sores.  Musculoskeletal: Negative for arthralgias, back pain, gait problem, joint swelling, myalgias and neck stiffness.  Skin: Negative for rash.  Neurological: Negative for dizziness, syncope, speech difficulty, weakness, numbness and headaches.  Hematological: Negative for adenopathy. Does not bruise/bleed easily.  Psychiatric/Behavioral: Negative for  behavioral problems and dysphoric mood. The patient is not nervous/anxious.        Objective:   Physical Exam  Constitutional: He is oriented to person, place, and time. He appears well-developed.  HENT:  Head: Normocephalic.  Right Ear: External ear normal.  Left Ear: External ear normal.  Eyes: Conjunctivae and EOM are normal.  Neck: Normal range of motion.  Cardiovascular: Normal rate and normal heart sounds.   Pulmonary/Chest: Breath sounds normal.  Abdominal: Bowel sounds are normal.  Musculoskeletal: Normal range of motion. He exhibits edema. He exhibits no tenderness.  Lower extremity edema with some stasis changes.  Left leg slightly more edematous compared to the right.  He had some prominent soft tissue swelling involving his left anterior proximal lower leg  Neurological: He is alert and oriented to person, place, and time.  Psychiatric: He has a normal mood and affect. His behavior is normal.          Assessment & Plan:  lower extremity edema.  Will increase Lasix to twice a day regimen at least short-term.  We'll increase potassium as well.  Short-term Morbid obesity, weight loss encouraged Hypertension.  Blood pressure borderline high today although will improve with volume depletion.  Low salt diet encouraged home blood pressure monitor and encouraged  Recheck 6 months or as needed.  He'll call if his lower extremity edema.  Does not improve

## 2014-01-06 NOTE — Progress Notes (Signed)
Pre visit review using our clinic review tool, if applicable. No additional management support is needed unless otherwise documented below in the visit note. 

## 2014-03-06 ENCOUNTER — Telehealth: Payer: Self-pay | Admitting: Internal Medicine

## 2014-03-06 NOTE — Telephone Encounter (Signed)
Patient Information:  Caller Name: Chanetta MarshallJimmy  Phone: [phone]  Patient: [name], [name]  Gender: Male  DOB: 05/25/1966  Age: [age] Years  PCP: Eleonore ChiquitoKwiatkowski, Peter (Family Practice > 4661yrs old)  Office Follow Up:  Does the office need to follow up with this patient?: No  Instructions For The Office: N/A  RN Note:  Per disp. pt states he does not have transportation today and would like appt for in the morning.    Symptoms  Reason For Call & Symptoms: Pt reports having edema bilateral lower extremities.  Reviewed Health History In EMR: Yes  Reviewed Medications In EMR: Yes  Reviewed Allergies In EMR: Yes  Reviewed Surgeries / Procedures: Yes  Date of Onset of Symptoms: 02/20/2014  Guideline(s) Used:  Leg Swelling and Edema  Disposition Per Guideline:   Go to ED Now (or to Office with PCP Approval)  Reason For Disposition Reached:   Severe swelling (e.g., swelling extends above knee, entire leg is swollen, weeping fluid)  Advice Given:  Call Back If:  You become worse.  Patient Will Follow Care Advice:  YES  Appointment Scheduled:  03/07/2014 09:30:00 Appointment Scheduled Provider:  Donell Beersucker, Matthew (only sees ages 3915 and up)

## 2014-03-07 ENCOUNTER — Ambulatory Visit: Payer: Self-pay | Admitting: Physician Assistant

## 2014-03-07 DIAGNOSIS — Z0289 Encounter for other administrative examinations: Secondary | ICD-10-CM

## 2014-07-14 ENCOUNTER — Encounter: Payer: Medicare Other | Admitting: Internal Medicine

## 2014-07-17 ENCOUNTER — Encounter: Payer: Self-pay | Admitting: Internal Medicine

## 2014-07-17 ENCOUNTER — Ambulatory Visit (INDEPENDENT_AMBULATORY_CARE_PROVIDER_SITE_OTHER): Payer: Medicare Other | Admitting: Internal Medicine

## 2014-07-17 VITALS — BP 136/90 | HR 93 | Temp 97.9°F | Resp 20 | Ht 70.5 in | Wt >= 6400 oz

## 2014-07-17 DIAGNOSIS — I10 Essential (primary) hypertension: Secondary | ICD-10-CM

## 2014-07-17 DIAGNOSIS — Z0000 Encounter for general adult medical examination without abnormal findings: Secondary | ICD-10-CM

## 2014-07-17 DIAGNOSIS — I2510 Atherosclerotic heart disease of native coronary artery without angina pectoris: Secondary | ICD-10-CM | POA: Diagnosis not present

## 2014-07-17 DIAGNOSIS — R7302 Impaired glucose tolerance (oral): Secondary | ICD-10-CM

## 2014-07-17 DIAGNOSIS — D869 Sarcoidosis, unspecified: Secondary | ICD-10-CM

## 2014-07-17 DIAGNOSIS — E785 Hyperlipidemia, unspecified: Secondary | ICD-10-CM

## 2014-07-17 LAB — CBC WITH DIFFERENTIAL/PLATELET
BASOS PCT: 0.2 % (ref 0.0–3.0)
Basophils Absolute: 0 10*3/uL (ref 0.0–0.1)
EOS PCT: 1.8 % (ref 0.0–5.0)
Eosinophils Absolute: 0.1 10*3/uL (ref 0.0–0.7)
HEMATOCRIT: 43 % (ref 39.0–52.0)
Hemoglobin: 14 g/dL (ref 13.0–17.0)
LYMPHS ABS: 1.6 10*3/uL (ref 0.7–4.0)
Lymphocytes Relative: 19.7 % (ref 12.0–46.0)
MCHC: 32.5 g/dL (ref 30.0–36.0)
MCV: 94.4 fl (ref 78.0–100.0)
MONO ABS: 0.9 10*3/uL (ref 0.1–1.0)
Monocytes Relative: 11.8 % (ref 3.0–12.0)
Neutro Abs: 5.2 10*3/uL (ref 1.4–7.7)
Neutrophils Relative %: 66.5 % (ref 43.0–77.0)
PLATELETS: 172 10*3/uL (ref 150.0–400.0)
RBC: 4.56 Mil/uL (ref 4.22–5.81)
RDW: 13.2 % (ref 11.5–15.5)
WBC: 7.9 10*3/uL (ref 4.0–10.5)

## 2014-07-17 LAB — TSH: TSH: 3.17 u[IU]/mL (ref 0.35–4.50)

## 2014-07-17 LAB — COMPREHENSIVE METABOLIC PANEL
ALBUMIN: 3.4 g/dL — AB (ref 3.5–5.2)
ALT: 12 U/L (ref 0–53)
AST: 14 U/L (ref 0–37)
Alkaline Phosphatase: 62 U/L (ref 39–117)
BILIRUBIN TOTAL: 0.5 mg/dL (ref 0.2–1.2)
BUN: 14 mg/dL (ref 6–23)
CO2: 34 meq/L — AB (ref 19–32)
Calcium: 9.2 mg/dL (ref 8.4–10.5)
Chloride: 101 mEq/L (ref 96–112)
Creatinine, Ser: 1 mg/dL (ref 0.40–1.50)
GFR: 101.27 mL/min (ref >60.00)
Glucose, Bld: 103 mg/dL — ABNORMAL HIGH (ref 70–99)
Potassium: 4.4 mEq/L (ref 3.5–5.1)
Sodium: 138 mEq/L (ref 135–145)
TOTAL PROTEIN: 8 g/dL (ref 6.0–8.3)

## 2014-07-17 LAB — LIPID PANEL
CHOL/HDL RATIO: 4
Cholesterol: 182 mg/dL (ref 0–200)
HDL: 44.7 mg/dL (ref >39.00)
LDL Cholesterol: 126 mg/dL — ABNORMAL HIGH (ref 0–99)
NONHDL: 137.3
Triglycerides: 58 mg/dL (ref 0.0–149.0)
VLDL: 11.6 mg/dL (ref 0.0–40.0)

## 2014-07-17 MED ORDER — POTASSIUM CHLORIDE CRYS ER 20 MEQ PO TBCR: 20.0000 meq | EXTENDED_RELEASE_TABLET | Freq: Every day | ORAL | Status: DC

## 2014-07-17 MED ORDER — LISINOPRIL 20 MG PO TABS: 20.0000 mg | ORAL_TABLET | Freq: Every day | ORAL | Status: DC

## 2014-07-17 MED ORDER — NORTRIPTYLINE HCL 25 MG PO CAPS: 25.0000 mg | ORAL_CAPSULE | Freq: Every day | ORAL | Status: DC

## 2014-07-17 MED ORDER — FUROSEMIDE 40 MG PO TABS: 40.0000 mg | ORAL_TABLET | Freq: Every day | ORAL | Status: DC

## 2014-07-17 MED ORDER — ELETRIPTAN HYDROBROMIDE 40 MG PO TABS
ORAL_TABLET | ORAL | Status: DC
Start: 2014-07-17 — End: 2015-10-07

## 2014-07-17 MED ORDER — SIMVASTATIN 20 MG PO TABS: 20.0000 mg | ORAL_TABLET | Freq: Every day | ORAL | Status: DC

## 2014-07-17 MED ORDER — METOPROLOL SUCCINATE ER 50 MG PO TB24: 50.0000 mg | ORAL_TABLET | Freq: Every day | ORAL | Status: DC

## 2014-07-17 NOTE — Progress Notes (Signed)
Subjective:    Patient ID: [name], male    DOB: 1965-08-27, 49 y.o.   MRN: [id number]  HPI       CC: Pt here for physical..   History of Present Illness:   49  year-old patient who is seen today for a health maintenance examination.  He was hospitalized in the spring of 2011on two occasions. In March., he was admitted for dysphasia, and underwent EDG which revealed an esophageal ring that required dilatation. He was hospitalized the following month due to a wide complex tachycardia thought secondary to ventricular tachycardia. He had a heart catheterization at that time, as well as a 2-D echocardiogram. He was discharged on metoprolol. Follow-up cardiology evaluation was not performed. He denies any history of palpitations or syncope.     Wt Readings from Last 3 Encounters:  07/17/14 464 lb 8 oz (210.696 kg)  01/06/14 446 lb (202.304 kg)  07/08/13 461 lb 4.8 oz (209.244 kg)    Past Medical History  Diagnosis Date  . HTN (hypertension)   . Sarcoidosis   . Morbid obesity   . Glucose intolerance (impaired glucose tolerance)   . Uveitis   . Asthma   . Low back pain   . DJD (degenerative joint disease)   . Headache(784.0)   . Depression   . GI bleed   . Helicobacter pylori gastritis   . CAD (coronary artery disease)     nonobstructive -- with LVH but preserved EF    History   Social History  . Marital Status: Divorced    Spouse Name: N/A  . Number of Children: 2  . Years of Education: N/A   Occupational History  . DISABILITY    Social History Main Topics  . Smoking status: Never Smoker   . Smokeless tobacco: Never Used  . Alcohol Use: No  . Drug Use: No  . Sexual Activity: Not on file   Other Topics Concern  . Not on file   Social History Narrative    Past Surgical History  Procedure Laterality Date  . Cataract extraction      Family History  Problem Relation Age of Onset  . Stroke    . Cancer    . Coronary artery disease    . Diabetes       Allergies  Allergen Reactions  . Codeine     Current Outpatient Prescriptions on File Prior to Visit  Medication Sig Dispense Refill  . aspirin 81 MG tablet Take 81 mg by mouth daily.      Marland Kitchen eletriptan (RELPAX) 40 MG tablet 1 tablet by mouth at the onset of headache. May repeat in 4 hours if necessary. Did not take more than 2 per 24 hours 10 tablet 2  . lisinopril (PRINIVIL,ZESTRIL) 20 MG tablet Take 1 tablet (20 mg total) by mouth daily. 90 tablet 1  . metoprolol succinate (TOPROL XL) 50 MG 24 hr tablet Take 1 tablet (50 mg total) by mouth daily. 90 tablet 1  . nortriptyline (PAMELOR) 25 MG capsule Take 1 capsule (25 mg total) by mouth at bedtime. Dr. Anne Hahn rx'ing 90 capsule 3  . simvastatin (ZOCOR) 20 MG tablet Take 1 tablet (20 mg total) by mouth at bedtime. 90 tablet 3  . furosemide (LASIX) 40 MG tablet Take 1 tablet (40 mg total) by mouth daily. 90 tablet 3  . potassium chloride SA (K-DUR,KLOR-CON) 20 MEQ tablet Take 1 tablet (20 mEq total) by mouth daily. 90 tablet 3   No  current facility-administered medications on file prior to visit.    BP 136/90 mmHg  Pulse 93  Temp(Src) 97.9 F (36.6 C) (Oral)  Resp 20  Ht 5' 10.5" (1.791 m)  Wt 464 lb 8 oz (210.696 kg)  BMI 65.68 kg/m2  SpO2 96%     Here for Medicare AWV:   1. Risk factors based on Past M, S, F history: risk factors include history of hypertension, morbid obesity, and family history. He also has a history of impaired glucose tolerance  2. Physical Activities: limited due to obesity  3. Depression/mood: history mild depression in the past, but no recent treatment  4. Hearing: no deficits  5. ADL's: independent in aspects of daily living  6. Fall Risk: moderate due to obesity  7. Home Safety: no problems identified  8. Height, weight, &visual acuity:height and weight stable. History of morbid obesity, blind right eye  9. Counseling: weight loss, more regular exercise, and heart healthy diet. All discussed   10. Labs ordered based on risk factors: will check a lipid profile, and hemoglobin A1c  11. Referral Coordination- will schedule for follow-up cardiology  12. Care Plan- will resume metoprolol and all  his medications 13. Cognitive Assessment- alert and oriented normal affect. No cognitive dysfunction  14.  He will require periodic eye examinations by ophthalmology due to his sarcoidosis.  He will have annual clinical exams by primary care. Patient was provided with a written and personalized care plan 15.  Provider list updated.  Includes ophthalmology primary care.    Allergies:  1) ! Codeine   Past History:  Past Medical History:   Hypertension  Sarcoidosis  FOB 08/11/05 pos ncg with airway involvemnt, steroid dep since 3/07  Mobid obesity, wt 340 when started prednisone  history glucose intolerance  ocular sarcoid ( uveitis)  blind right eye Asthma  Low back pain  right knee, DJD  Headache  depression  upper GI bleed , dysphagia secondary esopohageal ring March 2011  H. pylori gastritis  h/o WCT 4-11   Past Surgical History:  status post left cataract surgery  cardiac cath 4-11  EGD 3-11   Family History:   father died age 51, history of cerebral vascular disease; history of laryngeal cancer  Mother, age 51 has coronary artery disease ; DJD, status post pacemaker insertion  Five brothers, positive for diabetes; one deceased, unknown causes   Social History:   Patient never smoked.  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  HENT: Negative for congestion, dental problem, ear pain, hearing loss, mouth sores, rhinorrhea, sinus pressure, sneezing, tinnitus, trouble swallowing and voice change.   Eyes: Negative for photophobia, pain, redness and visual disturbance.        Blind right eye  Respiratory: Negative for apnea, cough, choking, chest tightness, shortness of breath and wheezing.   Cardiovascular: Positive for leg swelling. Negative for chest pain and palpitations.  Gastrointestinal: Negative for nausea, vomiting, abdominal pain, diarrhea, constipation, blood in stool, abdominal distention, anal bleeding and rectal pain.  Genitourinary: Negative for dysuria, urgency, frequency, hematuria, flank pain, decreased urine volume, discharge, penile swelling, scrotal swelling, difficulty urinating, genital sores and testicular pain.  Musculoskeletal: Positive for gait problem. Negative for myalgias, back pain, joint swelling, arthralgias (Right knee pain), neck pain and neck stiffness.  Skin: Negative for color change, rash and wound.  Neurological: Negative for dizziness, tremors, seizures, syncope, facial asymmetry, speech difficulty, weakness, light-headedness, numbness and headaches.  Hematological: Negative for adenopathy. Does not bruise/bleed easily.  Psychiatric/Behavioral: Negative for suicidal ideas, hallucinations, behavioral problems, confusion, sleep disturbance, self-injury, dysphoric mood, decreased concentration and agitation. The patient is not nervous/anxious.        Objective:   Physical Exam  Constitutional: He is oriented to person, place, and time. He appears well-developed.  Morbidly obese blood pressure 150/90 Weight 464  HENT:  Head: Normocephalic.  Right Ear: External ear normal.  Left Ear: External ear normal.  Eyes: Conjunctivae and EOM are normal.  Status post bilateral cataract extractions  Neck: Normal range of motion.  Cardiovascular: Normal rate, regular rhythm, normal heart sounds and intact distal pulses.  Exam reveals no gallop.   No murmur heard. Diminished right dorsalis pedis pulse  Pulmonary/Chest: Effort normal and breath sounds normal. No respiratory distress.  Abdominal: Soft. Bowel sounds are normal. He  exhibits no distension. There is no tenderness. There is no rebound.  Marked obesity  Genitourinary: Penis normal.  Musculoskeletal: Normal range of motion. He exhibits edema and tenderness.  Trace lower extremity edema with dry skin    Neurological: He is alert and oriented to person, place, and time.  Skin:  Plus 2 lower extremity edema with stasis changes  Psychiatric: He has a normal mood and affect. His behavior is normal.          Assessment & Plan:   Preventive health examination  Morbid obesity Hypertension- blood pressure  slightly high today off medications. Will resume medications History of ventricular tachycardia. Will resume metoprolol  History of ocular sarcoid Chronic venous insufficiency Dyslipidemia.  resume statin therapy Nonobstructive CAD. We'll continue efforts at aggressive risk factor modification  Compliance with his medications discussed.  New prescription refills printed out Return in 6 months for follow-up Weight loss encouraged Screening colonoscopy discussed and scheduled

## 2014-07-17 NOTE — Progress Notes (Signed)
Pre visit review using our clinic review tool, if applicable. No additional management support is needed unless otherwise documented below in the visit note. 

## 2014-07-17 NOTE — Patient Instructions (Addendum)
Limit your sodium (Salt) intake  You need to lose weight.  Consider a lower calorie diet and regular exercise.  Schedule your colonoscopy to help detect colon cancer.  Return in 6 months for follow-up Fat and Cholesterol Control Diet Fat and cholesterol levels in your blood and organs are influenced by your diet. High levels of fat and cholesterol may lead to diseases of the heart, small and large blood vessels, gallbladder, liver, and pancreas. CONTROLLING FAT AND CHOLESTEROL WITH DIET Although exercise and lifestyle factors are important, your diet is key. That is because certain foods are known to raise cholesterol and others to lower it. The goal is to balance foods for their effect on cholesterol and more importantly, to replace saturated and trans fat with other types of fat, such as monounsaturated fat, polyunsaturated fat, and omega-3 fatty acids. On average, a person should consume no more than 15 to 17 g of saturated fat daily. Saturated and trans fats are considered "bad" fats, and they will raise LDL cholesterol. Saturated fats are primarily found in animal products such as meats, butter, and cream. However, that does not mean you need to give up all your favorite foods. Today, there are good tasting, low-fat, low-cholesterol substitutes for most of the things you like to eat. Choose low-fat or nonfat alternatives. Choose round or loin cuts of red meat. These types of cuts are lowest in fat and cholesterol. Chicken (without the skin), fish, veal, and ground [place] breast are great choices. Eliminate fatty meats, such as hot dogs and salami. Even shellfish have little or no saturated fat. Have a 3 oz (85 g) portion when you eat lean meat, poultry, or fish. Trans fats are also called "partially hydrogenated oils." They are oils that have been scientifically manipulated so that they are solid at room temperature resulting in a longer shelf life and improved taste and texture of foods in which  they are added. Trans fats are found in stick margarine, some tub margarines, cookies, crackers, and baked goods.  When baking and cooking, oils are a great substitute for butter. The monounsaturated oils are especially beneficial since it is believed they lower LDL and raise HDL. The oils you should avoid entirely are saturated tropical oils, such as coconut and palm.  Remember to eat a lot from food groups that are naturally free of saturated and trans fat, including fish, fruit, vegetables, beans, grains (barley, rice, couscous, bulgur wheat), and pasta (without cream sauces).  IDENTIFYING FOODS THAT LOWER FAT AND CHOLESTEROL  Soluble fiber may lower your cholesterol. This type of fiber is found in fruits such as apples, vegetables such as broccoli, potatoes, and carrots, legumes such as beans, peas, and lentils, and grains such as barley. Foods fortified with plant sterols (phytosterol) may also lower cholesterol. You should eat at least 2 g per day of these foods for a cholesterol lowering effect.  Read package labels to identify low-saturated fats, trans fat free, and low-fat foods at the supermarket. Select cheeses that have only 2 to 3 g saturated fat per ounce. Use a heart-healthy tub margarine that is free of trans fats or partially hydrogenated oil. When buying baked goods (cookies, crackers), avoid partially hydrogenated oils. Breads and muffins should be made from whole grains (whole-wheat or whole oat flour, instead of "flour" or "enriched flour"). Buy non-creamy canned soups with reduced salt and no added fats.  FOOD PREPARATION TECHNIQUES  Never deep-fry. If you must fry, either stir-fry, which uses very little fat, or  use non-stick cooking sprays. When possible, broil, bake, or roast meats, and steam vegetables. Instead of putting butter or margarine on vegetables, use lemon and herbs, applesauce, and cinnamon (for squash and sweet potatoes). Use nonfat yogurt, salsa, and low-fat dressings  for salads.  LOW-SATURATED FAT / LOW-FAT FOOD SUBSTITUTES Meats / Saturated Fat (g)  Avoid: Steak, marbled (3 oz/85 g) / 11 g  Choose: Steak, lean (3 oz/85 g) / 4 g  Avoid: Hamburger (3 oz/85 g) / 7 g  Choose: Hamburger, lean (3 oz/85 g) / 5 g  Avoid: Ham (3 oz/85 g) / 6 g  Choose: Ham, lean cut (3 oz/85 g) / 2.4 g  Avoid: Chicken, with skin, dark meat (3 oz/85 g) / 4 g  Choose: Chicken, skin removed, dark meat (3 oz/85 g) / 2 g  Avoid: Chicken, with skin, light meat (3 oz/85 g) / 2.5 g  Choose: Chicken, skin removed, light meat (3 oz/85 g) / 1 g Dairy / Saturated Fat (g)  Avoid: Whole milk (1 cup) / 5 g  Choose: Low-fat milk, 2% (1 cup) / 3 g  Choose: Low-fat milk, 1% (1 cup) / 1.5 g  Choose: Skim milk (1 cup) / 0.3 g  Avoid: Hard cheese (1 oz/28 g) / 6 g  Choose: Skim milk cheese (1 oz/28 g) / 2 to 3 g  Avoid: Cottage cheese, 4% fat (1 cup) / 6.5 g  Choose: Low-fat cottage cheese, 1% fat (1 cup) / 1.5 g  Avoid: Ice cream (1 cup) / 9 g  Choose: Sherbet (1 cup) / 2.5 g  Choose: Nonfat frozen yogurt (1 cup) / 0.3 g  Choose: Frozen fruit bar / trace  Avoid: Whipped cream (1 tbs) / 3.5 g  Choose: Nondairy whipped topping (1 tbs) / 1 g Condiments / Saturated Fat (g)  Avoid: Mayonnaise (1 tbs) / 2 g  Choose: Low-fat mayonnaise (1 tbs) / 1 g  Avoid: Butter (1 tbs) / 7 g  Choose: Extra light margarine (1 tbs) / 1 g  Avoid: Coconut oil (1 tbs) / 11.8 g  Choose: Olive oil (1 tbs) / 1.8 g  Choose: Corn oil (1 tbs) / 1.7 g  Choose: Safflower oil (1 tbs) / 1.2 g  Choose: Sunflower oil (1 tbs) / 1.4 g  Choose: Soybean oil (1 tbs) / 2.4 g  Choose: Canola oil (1 tbs) / 1 g Document Released: 05/16/2005 Document Revised: 09/10/2012 Document Reviewed: 08/14/2013 [employer] Patient Information 2015 [place], [place]. This information is not intended to replace advice given to you by your health care provider. Make sure you discuss any questions you have with your  health care provider. Health Maintenance A healthy lifestyle and preventative care can promote health and wellness.  Maintain regular health, dental, and eye exams.  Eat a healthy diet. Foods like vegetables, fruits, whole grains, low-fat dairy products, and lean protein foods contain the nutrients you need and are low in calories. Decrease your intake of foods high in solid fats, added sugars, and salt. Get information about a proper diet from your health care provider, if necessary.  Regular physical exercise is one of the most important things you can do for your health. Most adults should get at least 150 minutes of moderate-intensity exercise (any activity that increases your heart rate and causes you to sweat) each week. In addition, most adults need muscle-strengthening exercises on 2 or more days a week.   Maintain a healthy weight. The body mass index (BMI) is  a screening tool to identify possible weight problems. It provides an estimate of body fat based on height and weight. Your health care provider can find your BMI and can help you achieve or maintain a healthy weight. For males 20 years and older:  A BMI below 18.5 is considered underweight.  A BMI of 18.5 to 24.9 is normal.  A BMI of 25 to 29.9 is considered overweight.  A BMI of 30 and above is considered obese.  Maintain normal blood lipids and cholesterol by exercising and minimizing your intake of saturated fat. Eat a balanced diet with plenty of fruits and vegetables. Blood tests for lipids and cholesterol should begin at age 26 and be repeated every 5 years. If your lipid or cholesterol levels are high, you are over age 9, or you are at high risk for heart disease, you may need your cholesterol levels checked more frequently.Ongoing high lipid and cholesterol levels should be treated with medicines if diet and exercise are not working.  If you smoke, find out from your health care provider how to quit. If you do not use  tobacco, do not start.  Lung cancer screening is recommended for adults aged 55-80 years who are at high risk for developing lung cancer because of a history of smoking. A yearly low-dose CT scan of the lungs is recommended for people who have at least a 30-pack-year history of smoking and are current smokers or have quit within the past 15 years. A pack year of smoking is smoking an average of 1 pack of cigarettes a day for 1 year (for example, a 30-pack-year history of smoking could mean smoking 1 pack a day for 30 years or 2 packs a day for 15 years). Yearly screening should continue until the smoker has stopped smoking for at least 15 years. Yearly screening should be stopped for people who develop a health problem that would prevent them from having lung cancer treatment.  If you choose to drink alcohol, do not have more than 2 drinks per day. One drink is considered to be 12 oz (360 mL) of beer, 5 oz (150 mL) of wine, or 1.5 oz (45 mL) of liquor.  Avoid the use of street drugs. Do not share needles with anyone. Ask for help if you need support or instructions about stopping the use of drugs.  High blood pressure causes heart disease and increases the risk of stroke. Blood pressure should be checked at least every 1-2 years. Ongoing high blood pressure should be treated with medicines if weight loss and exercise are not effective.  If you are 98-28 years old, ask your health care provider if you should take aspirin to prevent heart disease.  Diabetes screening involves taking a blood sample to check your fasting blood sugar level. This should be done once every 3 years after age 32 if you are at a normal weight and without risk factors for diabetes. Testing should be considered at a younger age or be carried out more frequently if you are overweight and have at least 1 risk factor for diabetes.  Colorectal cancer can be detected and often prevented. Most routine colorectal cancer screening begins  at the age of 88 and continues through age 40. However, your health care provider may recommend screening at an earlier age if you have risk factors for colon cancer. On a yearly basis, your health care provider may provide home test kits to check for hidden blood in the stool. A small  camera at the end of a tube may be used to directly examine the colon (sigmoidoscopy or colonoscopy) to detect the earliest forms of colorectal cancer. Talk to your health care provider about this at age 51 when routine screening begins. A direct exam of the colon should be repeated every 5-10 years through age 51, unless early forms of precancerous polyps or small growths are found.  People who are at an increased risk for hepatitis B should be screened for this virus. You are considered at high risk for hepatitis B if:  You were born in a country where hepatitis B occurs often. Talk with your health care provider about which countries are considered high risk.  Your parents were born in a high-risk country and you have not received a shot to protect against hepatitis B (hepatitis B vaccine).  You have HIV or AIDS.  You use needles to inject street drugs.  You live with, or have sex with, someone who has hepatitis B.  You are a man who has sex with other men (MSM).  You get hemodialysis treatment.  You take certain medicines for conditions like cancer, organ transplantation, and autoimmune conditions.  Hepatitis C blood testing is recommended for all people born from [id number] through 1967 and any individual with known risk factors for hepatitis C.  Healthy men should no longer receive prostate-specific antigen (PSA) blood tests as part of routine cancer screening. Talk to your health care provider about prostate cancer screening.  Testicular cancer screening is not recommended for adolescents or adult males who have no symptoms. Screening includes self-exam, a health care provider exam, and other screening tests.  Consult with your health care provider about any symptoms you have or any concerns you have about testicular cancer.  Practice safe sex. Use condoms and avoid high-risk sexual practices to reduce the spread of sexually transmitted infections (STIs).  You should be screened for STIs, including gonorrhea and chlamydia if:  You are sexually active and are younger than 24 years.  You are older than 24 years, and your health care provider tells you that you are at risk for this type of infection.  Your sexual activity has changed since you were last screened, and you are at an increased risk for chlamydia or gonorrhea. Ask your health care provider if you are at risk.  If you are at risk of being infected with HIV, it is recommended that you take a prescription medicine daily to prevent HIV infection. This is called pre-exposure prophylaxis (PrEP). You are considered at risk if:  You are a man who has sex with other men (MSM).  You are a heterosexual man who is sexually active with multiple partners.  You take drugs by injection.  You are sexually active with a partner who has HIV.  Talk with your health care provider about whether you are at high risk of being infected with HIV. If you choose to begin PrEP, you should first be tested for HIV. You should then be tested every 3 months for as long as you are taking PrEP.  Use sunscreen. Apply sunscreen liberally and repeatedly throughout the day. You should seek shade when your shadow is shorter than you. Protect yourself by wearing long sleeves, pants, a wide-brimmed hat, and sunglasses year round whenever you are outdoors.  Tell your health care provider of new moles or changes in moles, especially if there is a change in shape or color. Also, tell your health care provider if a mole  is larger than the size of a pencil eraser.  A one-time screening for abdominal aortic aneurysm (AAA) and surgical repair of large AAAs by ultrasound is  recommended for men aged 65-75 years who are current or former smokers.  Stay current with your vaccines (immunizations). Document Released: 11/12/2007 Document Revised: 05/21/2013 Document Reviewed: 10/11/2010 [facility] Patient Information 2015 [place], [place]. This information is not intended to replace advice given to you by your health care provider. Make sure you discuss any questions you have with your health care provider.

## 2014-07-18 ENCOUNTER — Encounter: Payer: Self-pay | Admitting: Gastroenterology

## 2014-08-26 ENCOUNTER — Telehealth: Payer: Self-pay | Admitting: *Deleted

## 2014-08-26 NOTE — Telephone Encounter (Signed)
Dr Christella HartiganJacobs, This pt is scheduled for a colonoscopy with you Friday 09-12-2014 for a direct colon.  He has a BMI of 65.8 and a weight of 464 lb per his last office visit with his PCP. He has a hx of h pylori, sarcoidosis, asthma, ventricular tachycardia . He had an echo that showed an EF of 60% and 08-2009 had a cardiac cath, 07-2009 he had an egd with dilation at [facility] with fentanyl 75 versed 6.   Do you want an Ov or can he be a direct colon? Please advise    Thanks for your time, Elizebeth BrookingMarie PV

## 2014-08-27 ENCOUNTER — Emergency Department (INDEPENDENT_AMBULATORY_CARE_PROVIDER_SITE_OTHER)
Admission: EM | Admit: 2014-08-27 | Discharge: 2014-08-27 | Disposition: A | Discharge status: Home / Self Care | Payer: Medicare Other | Source: Home / Self Care | Attending: Family Medicine | Admitting: Family Medicine

## 2014-08-27 ENCOUNTER — Encounter (HOSPITAL_COMMUNITY): Payer: Self-pay | Admitting: Emergency Medicine

## 2014-08-27 DIAGNOSIS — T783XXA Angioneurotic edema, initial encounter: Secondary | ICD-10-CM | POA: Diagnosis not present

## 2014-08-27 MED ORDER — PREDNISONE 10 MG PO KIT
PACK | ORAL | Status: DC
Start: 2014-08-27 — End: 2014-12-04

## 2014-08-27 NOTE — Telephone Encounter (Signed)
Not sure colon cancer screening is so relevant for him at that size. Please cancel the upcoming colonosocpy in [facility] and schedule him for new GI appt in office to discuss colon cancer screening, screening options.

## 2014-08-27 NOTE — ED Provider Notes (Signed)
[name] is a 49 y.o. male who presents to Urgent Care today for lip swelling. Symptoms started today at 3am. He denies any tongue or throat swelling. No fever, chills NVD. No treatment tried yet. No bee sting, new meds or cosmetics, soaps, shampoos etc. He feels well otherwise.    Past Medical History  Diagnosis Date  . HTN (hypertension)   . Sarcoidosis   . Morbid obesity   . Glucose intolerance (impaired glucose tolerance)   . Uveitis   . Asthma   . Low back pain   . DJD (degenerative joint disease)   . Headache(784.0)   . Depression   . GI bleed   . Helicobacter pylori gastritis   . CAD (coronary artery disease)     nonobstructive -- with LVH but preserved EF   Past Surgical History  Procedure Laterality Date  . Cataract extraction     History  Substance Use Topics  . Smoking status: Never Smoker   . Smokeless tobacco: Never Used  . Alcohol Use: No   ROS as above Medications: No current facility-administered medications for this encounter.   Current Outpatient Prescriptions  Medication Sig Dispense Refill  . furosemide (LASIX) 40 MG tablet Take 1 tablet (40 mg total) by mouth daily. 90 tablet 3  . potassium chloride SA (K-DUR,KLOR-CON) 20 MEQ tablet Take 1 tablet (20 mEq total) by mouth daily. 90 tablet 3  . simvastatin (ZOCOR) 20 MG tablet Take 1 tablet (20 mg total) by mouth at bedtime. 90 tablet 1  . [DISCONTINUED] lisinopril (PRINIVIL,ZESTRIL) 20 MG tablet Take 1 tablet (20 mg total) by mouth daily. 90 tablet 1  . aspirin 81 MG tablet Take 81 mg by mouth daily.      Marland Kitchen eletriptan (RELPAX) 40 MG tablet 1 tablet by mouth at the onset of headache. May repeat in 4 hours if necessary. Did not take more than 2 per 24 hours 10 tablet 2  . metoprolol succinate (TOPROL XL) 50 MG 24 hr tablet Take 1 tablet (50 mg total) by mouth daily. 90 tablet 1  . nortriptyline (PAMELOR) 25 MG capsule Take 1 capsule (25 mg total) by mouth at bedtime. Dr. Jannifer Franklin rx'ing 90 capsule 3   . PredniSONE 10 MG KIT 6 day dose pack po 1 kit 0   Allergies  Allergen Reactions  . Codeine      Exam:  BP 151/88 mmHg  Pulse 97  Temp(Src) 98.6 F (37 C) (Oral)  Resp 20  SpO2 97% Gen: Well NAD morbid obesity.  HEENT: EOMI,  MMM upper lip swelling. No tongue swelling or throat swelling.  Lungs: Normal work of breathing. CTABL Heart: RRR no MRG Abd: NABS, Soft. Nondistended, Nontender Exts: Brisk capillary refill, warm and well perfused.   No results found for this or any previous visit (from the past 24 hour(s)). No results found.  Assessment and Plan: 49 y.o. male with angioedema. Treat with prednisone and H1 and H2 blockers. Discontinue lisinopril. Follow-up with PCP.  Discussed warning signs or symptoms. Please see discharge instructions. Patient expresses understanding.     Gregor Hams, MD 08/27/14 [phone]

## 2014-08-27 NOTE — Telephone Encounter (Signed)
Pt aware and procedure was cx and New appt scheduled

## 2014-08-27 NOTE — Discharge Instructions (Signed)
Thank you for coming in today. STOP lisinopril.  Follow up with your doctor.  Take prednisone daily for 6 days.  Take benadryl every 6 hours for 1 days.  Take over the counter ranitidine twice daily for 1 days.  Go to the ER if you get worse.  Call or go to the emergency room if you get worse, have trouble breathing, have chest pains, or palpitations.    Angioedema Angioedema is a sudden swelling of tissues, often of the skin. It can occur on the face or genitals or in the abdomen or other body parts. The swelling usually develops over a short period and gets better in 24 to 48 hours. It often begins during the night and is found when the person wakes up. The person may also get red, itchy patches of skin (hives). Angioedema can be dangerous if it involves swelling of the air passages.  Depending on the cause, episodes of angioedema may only happen once, come back in unpredictable patterns, or repeat for several years and then gradually fade away.  CAUSES  Angioedema can be caused by an allergic reaction to various triggers. It can also result from nonallergic causes, including reactions to drugs, immune system disorders, viral infections, or an abnormal gene that is passed to you from your parents (hereditary). For some people with angioedema, the cause is unknown.  Some things that can trigger angioedema include:   Foods.   Medicines, such as ACE inhibitors, ARBs, nonsteroidal anti-inflammatory agents, or estrogen.   Latex.   Animal saliva.   Insect stings.   Dyes used in X-rays.   Mild injury.   Dental work.  Surgery.  Stress.   Sudden changes in temperature.   Exercise. SIGNS AND SYMPTOMS   Swelling of the skin.  Hives. If these are present, there is also intense itching.  Redness in the affected area.   Pain in the affected area.  Swollen lips or tongue.  Breathing problems. This may happen if the air passages swell.  Wheezing. If internal organs  are involved, there may be:   Nausea.   Abdominal pain.   Vomiting.   Difficulty swallowing.   Difficulty passing urine. DIAGNOSIS   Your health care provider will examine the affected area and take a medical and family history.  Various tests may be done to help determine the cause. Tests may include:  Allergy skin tests to see if the problem is an allergic reaction.   Blood tests to check for hereditary angioedema.   Tests to check for underlying diseases that could cause the condition.   A review of your medicines, including over-the-counter medicines, may be done. TREATMENT  Treatment will depend on the cause of the angioedema. Possible treatments include:   Removal of anything that triggered the condition (such as stopping certain medicines).   Medicines to treat symptoms or prevent attacks. Medicines given may include:   Antihistamines.   Epinephrine injection.   Steroids.   Hospitalization may be required for severe attacks. If the air passages are affected, it can be an emergency. Tubes may need to be placed to keep the airway open. HOME CARE INSTRUCTIONS   Take all medicines as directed by your health care provider.  If you were given medicines for emergency allergy treatment, always carry them with you.  Wear a medical bracelet as directed by your health care provider.   Avoid known triggers. SEEK MEDICAL CARE IF:   You have repeat attacks of angioedema.   Your attacks  are more frequent or more severe despite preventive measures.   You have hereditary angioedema and are considering having children. It is important to discuss with your health care provider the risks of passing the condition on to your children. SEEK IMMEDIATE MEDICAL CARE IF:   You have severe swelling of the mouth, tongue, or lips.  You have difficulty breathing.   You have difficulty swallowing.   You faint. MAKE SURE YOU:  Understand these  instructions.  Will watch your condition.  Will get help right away if you are not doing well or get worse. Document Released: 07/25/2001 Document Revised: 09/30/2013 Document Reviewed: 01/07/2013 [facility] Patient Information 2015 [place], [place]. This information is not intended to replace advice given to you by your health care provider. Make sure you discuss any questions you have with your health care provider.

## 2014-08-27 NOTE — ED Notes (Signed)
Reports swelling of lips onset 0300 today Denies new meds/foods/or hygiene products.  Denies difficult breathing or swelling Alert, no signs of acute distress.

## 2014-09-05 ENCOUNTER — Telehealth: Payer: Self-pay | Admitting: Internal Medicine

## 2014-09-05 NOTE — Telephone Encounter (Signed)
Pt states dr Christella Hartiganjacobs called and cancelled his appt to gi. Pt was advised to call pcp. Pt doesn't know what to do from here. pls advise

## 2014-09-05 NOTE — Telephone Encounter (Signed)
Spoke to pt, told him appointment for Colonoscopy was cancelled but you have an appt with Dr. Christella HartiganJacobs with GI on 5/20 at 8:30AM to evaluate and discuss what needs to be done. The provider wanted to see you first before any procedure was scheduled. Pt verbalized understanding.

## 2014-09-09 DIAGNOSIS — E119 Type 2 diabetes mellitus without complications: Secondary | ICD-10-CM | POA: Diagnosis not present

## 2014-09-09 DIAGNOSIS — H2000 Unspecified acute and subacute iridocyclitis: Secondary | ICD-10-CM | POA: Diagnosis not present

## 2014-09-09 DIAGNOSIS — H43811 Vitreous degeneration, right eye: Secondary | ICD-10-CM | POA: Diagnosis not present

## 2014-09-09 DIAGNOSIS — Z961 Presence of intraocular lens: Secondary | ICD-10-CM | POA: Diagnosis not present

## 2014-09-12 ENCOUNTER — Encounter: Payer: Medicare Other | Admitting: Gastroenterology

## 2014-10-17 ENCOUNTER — Encounter: Payer: Self-pay | Admitting: Gastroenterology

## 2014-10-17 ENCOUNTER — Ambulatory Visit (INDEPENDENT_AMBULATORY_CARE_PROVIDER_SITE_OTHER): Payer: Medicare Other | Admitting: Gastroenterology

## 2014-10-17 VITALS — BP 130/80 | HR 68 | Ht 70.0 in | Wt >= 6400 oz

## 2014-10-17 DIAGNOSIS — Z1211 Encounter for screening for malignant neoplasm of colon: Secondary | ICD-10-CM

## 2014-10-17 NOTE — Patient Instructions (Signed)
Colon cancer screening with Cologuard stool test.  If this is positive, then you will need colonoscopy.

## 2014-10-17 NOTE — Progress Notes (Signed)
HPI: This is a  very pleasant 49 year old man     who was referred to me by Marletta Lor, MD  to evaluate  colon cancer screening   Chief complaint is colon cancer screening, massive obesity  He has never had colon cancer screening that he is aware of. He has no issues with his bowels, no overt bleeding, no changes in his bowels, no significant abdominal pains. There is no family history of colon cancer.  He is massively obese with a BMI of 67, weight of 440 pounds.     Review of systems: Pertinent positive and negative review of systems were noted in the above HPI section. Complete review of systems was performed and was otherwise normal.   Past Medical History  Diagnosis Date  . HTN (hypertension)   . Sarcoidosis   . Morbid obesity   . Glucose intolerance (impaired glucose tolerance)   . Uveitis   . Asthma   . Low back pain   . DJD (degenerative joint disease)   . Headache(784.0)   . Depression   . GI bleed   . Helicobacter pylori gastritis   . CAD (coronary artery disease)     nonobstructive -- with LVH but preserved EF    Past Surgical History  Procedure Laterality Date  . Cataract extraction      Current Outpatient Prescriptions  Medication Sig Dispense Refill  . aspirin 81 MG tablet Take 81 mg by mouth daily.      Marland Kitchen eletriptan (RELPAX) 40 MG tablet 1 tablet by mouth at the onset of headache. May repeat in 4 hours if necessary. Did not take more than 2 per 24 hours 10 tablet 2  . furosemide (LASIX) 40 MG tablet Take 1 tablet (40 mg total) by mouth daily. 90 tablet 3  . metoprolol succinate (TOPROL XL) 50 MG 24 hr tablet Take 1 tablet (50 mg total) by mouth daily. 90 tablet 1  . nortriptyline (PAMELOR) 25 MG capsule Take 1 capsule (25 mg total) by mouth at bedtime. Dr. Jannifer Franklin rx'ing 90 capsule 3  . potassium chloride SA (K-DUR,KLOR-CON) 20 MEQ tablet Take 1 tablet (20 mEq total) by mouth daily. 90 tablet 3  . PredniSONE 10 MG KIT 6 day dose pack po 1 kit 0   . simvastatin (ZOCOR) 20 MG tablet Take 1 tablet (20 mg total) by mouth at bedtime. 90 tablet 1  . [DISCONTINUED] lisinopril (PRINIVIL,ZESTRIL) 20 MG tablet Take 1 tablet (20 mg total) by mouth daily. 90 tablet 1   No current facility-administered medications for this visit.    Allergies as of 10/17/2014 - Review Complete 10/17/2014  Allergen Reaction Noted  . Codeine      Family History  Problem Relation Age of Onset  . Colon cancer Neg Hx   . Heart disease Mother   . Diabetes Brother     x2  . Colon polyps Neg Hx   . Kidney disease Neg Hx   . Esophageal cancer Father     History   Social History  . Marital Status: Divorced    Spouse Name: N/A  . Number of Children: 2  . Years of Education: N/A   Occupational History  . DISABILITY    Social History Main Topics  . Smoking status: Never Smoker   . Smokeless tobacco: Never Used  . Alcohol Use: No  . Drug Use: No  . Sexual Activity: Not on file   Other Topics Concern  . Not on file  Social History Narrative     Physical Exam: Ht 5' 10"  (1.778 m)  Wt 468 lb (212.283 kg)  BMI 67.15 kg/m2 Constitutional: generally well-appearing Psychiatric: alert and oriented x3 Eyes: extraocular movements intact Mouth: oral pharynx moist, no lesions Neck: supple no lymphadenopathy Cardiovascular: heart regular rate and rhythm Lungs: clear to auscultation bilaterally Abdomen: soft, nontender, nondistended, no obvious ascites, no peritoneal signs, normal bowel sounds Extremities: no lower extremity edema bilaterally Skin: no lesions on visible extremities   Assessment and plan: 49 y.o. male with  massive obesity, routine risk for colon cancer  I explained that with colon cancer screening there are options. One is colonoscopy beginning around the age of 22 and if it is normal he does not need colon cancer screening again for 10 years. Colonoscopy dizzy opportunity to remove precancerous polyps as well. The other option is  stool based test newer test called Colo guard.  If this test is positive then he would need colonoscopy. If it were negative he would not need to discuss colon cancer screening again for 3-5 years or so. I explained that with his massive obesity he is probably at higher risk for potential procedural complications during a colonoscopy. I also explained that there is a chance a colonoscopy to be technically impossible to complete given his size. We agreed that stool based testing was probably his best option for first line colon cancer screening and we will let him know the results of Colo guard.     Owens Loffler, MD [facility] 10/17/2014, 8:26 AM  Cc: Marletta Lor, MD

## 2014-10-29 DIAGNOSIS — Z1212 Encounter for screening for malignant neoplasm of rectum: Secondary | ICD-10-CM | POA: Diagnosis not present

## 2014-10-29 DIAGNOSIS — Z1211 Encounter for screening for malignant neoplasm of colon: Secondary | ICD-10-CM | POA: Diagnosis not present

## 2014-11-06 LAB — COLOGUARD

## 2014-11-07 ENCOUNTER — Telehealth: Payer: Self-pay | Admitting: Gastroenterology

## 2014-11-07 NOTE — Telephone Encounter (Signed)
Please call him.  Cologuard stool test was negative.  He should have colon cancer screening again in 3-5 years.  I will forward to his PCP as well.

## 2014-11-07 NOTE — Telephone Encounter (Signed)
Pt has been notified and PCP was sent a copy of the result

## 2014-11-17 ENCOUNTER — Encounter: Payer: Self-pay | Admitting: Gastroenterology

## 2014-12-04 ENCOUNTER — Telehealth: Payer: Self-pay | Admitting: Internal Medicine

## 2014-12-04 ENCOUNTER — Encounter: Payer: Self-pay | Admitting: Internal Medicine

## 2014-12-04 ENCOUNTER — Ambulatory Visit (INDEPENDENT_AMBULATORY_CARE_PROVIDER_SITE_OTHER): Payer: Medicare Other | Admitting: Internal Medicine

## 2014-12-04 DIAGNOSIS — I2510 Atherosclerotic heart disease of native coronary artery without angina pectoris: Secondary | ICD-10-CM

## 2014-12-04 DIAGNOSIS — M1711 Unilateral primary osteoarthritis, right knee: Secondary | ICD-10-CM | POA: Diagnosis not present

## 2014-12-04 MED ORDER — TRAMADOL HCL 50 MG PO TABS: 50.0000 mg | ORAL_TABLET | Freq: Four times a day (QID) | ORAL | Status: DC | PRN

## 2014-12-04 NOTE — Patient Instructions (Signed)
Orthopedic evaluation as discussed 

## 2014-12-04 NOTE — Progress Notes (Signed)
Subjective:    Patient ID: [name], male    DOB: 07/16/1965, 49 y.o.   MRN: [id number]  HPI 49 year old patient who has a history of morbid obesity.  He has a long history of intermittent bilateral knee pain.  Bilateral 2 view x-rays of the knees were obtained in February 2015.  The revealed bilateral osteoarthritis, left greater than the right. For the past 2 weeks he has had worsening right medial leg pain.  No history of recent trauma. He does have a history of H. pylori infection and has been using Tylenol only. He has a history of coronary artery disease  Past Medical History  Diagnosis Date  . HTN (hypertension)   . Sarcoidosis   . Morbid obesity   . Glucose intolerance (impaired glucose tolerance)   . Uveitis   . Asthma   . Low back pain   . DJD (degenerative joint disease)   . Headache(784.0)   . Depression   . GI bleed   . Helicobacter pylori gastritis   . CAD (coronary artery disease)     nonobstructive -- with LVH but preserved EF    History   Social History  . Marital Status: Divorced    Spouse Name: N/A  . Number of Children: 2  . Years of Education: N/A   Occupational History  . DISABILITY    Social History Main Topics  . Smoking status: Never Smoker   . Smokeless tobacco: Never Used  . Alcohol Use: No  . Drug Use: No  . Sexual Activity: Not on file   Other Topics Concern  . Not on file   Social History Narrative    Past Surgical History  Procedure Laterality Date  . Cataract extraction      Family History  Problem Relation Age of Onset  . Colon cancer Neg Hx   . Heart disease Mother   . Diabetes Brother     x2  . Colon polyps Neg Hx   . Kidney disease Neg Hx   . Esophageal cancer Father     Allergies  Allergen Reactions  . Codeine     Current Outpatient Prescriptions on File Prior to Visit  Medication Sig Dispense Refill  . aspirin 81 MG tablet Take 81 mg by mouth daily.      Marland Kitchen. eletriptan (RELPAX) 40 MG tablet 1  tablet by mouth at the onset of headache. May repeat in 4 hours if necessary. Did not take more than 2 per 24 hours 10 tablet 2  . furosemide (LASIX) 40 MG tablet Take 1 tablet (40 mg total) by mouth daily. 90 tablet 3  . metoprolol succinate (TOPROL XL) 50 MG 24 hr tablet Take 1 tablet (50 mg total) by mouth daily. 90 tablet 1  . nortriptyline (PAMELOR) 25 MG capsule Take 1 capsule (25 mg total) by mouth at bedtime. Dr. Anne HahnWillis rx'ing 90 capsule 3  . potassium chloride SA (K-DUR,KLOR-CON) 20 MEQ tablet Take 1 tablet (20 mEq total) by mouth daily. 90 tablet 3  . simvastatin (ZOCOR) 20 MG tablet Take 1 tablet (20 mg total) by mouth at bedtime. 90 tablet 1  . [DISCONTINUED] lisinopril (PRINIVIL,ZESTRIL) 20 MG tablet Take 1 tablet (20 mg total) by mouth daily. 90 tablet 1   No current facility-administered medications on file prior to visit.    BP 140/90 mmHg  Pulse 92  Temp(Src) 98.1 F (36.7 C) (Oral)  Resp 20  Ht 5\' 10"  (1.778 m)  Wt 463 lb 8  oz (210.242 kg)  BMI 66.51 kg/m2  SpO2 95%      Review of Systems  Constitutional: Negative for fever, chills, appetite change and fatigue.  HENT: Negative for congestion, dental problem, ear pain, hearing loss, sore throat, tinnitus, trouble swallowing and voice change.   Eyes: Negative for pain, discharge and visual disturbance.  Respiratory: Negative for cough, chest tightness, wheezing and stridor.   Cardiovascular: Negative for chest pain, palpitations and leg swelling.  Gastrointestinal: Negative for nausea, vomiting, abdominal pain, diarrhea, constipation, blood in stool and abdominal distention.  Genitourinary: Negative for urgency, hematuria, flank pain, discharge, difficulty urinating and genital sores.  Musculoskeletal: Positive for arthralgias and gait problem. Negative for myalgias, back pain, joint swelling and neck stiffness.  Skin: Negative for rash.  Neurological: Negative for dizziness, syncope, speech difficulty, weakness,  numbness and headaches.  Hematological: Negative for adenopathy. Does not bruise/bleed easily.  Psychiatric/Behavioral: Negative for behavioral problems and dysphoric mood. The patient is not nervous/anxious.        Objective:   Physical Exam  Constitutional: He appears well-developed and well-nourished. No distress.  Weight 463  Musculoskeletal:  Tenderness along the right medial knee region.  No obvious effusion but obesity makes the exam difficult          Assessment & Plan:   Right knee pain.  Probable secondary to osteoarthritis.  Will set up for orthopedic evaluation Morbid obesity

## 2014-12-04 NOTE — Telephone Encounter (Signed)
Error will sch an appt

## 2014-12-04 NOTE — Progress Notes (Signed)
Pre visit review using our clinic review tool, if applicable. No additional management support is needed unless otherwise documented below in the visit note. 

## 2014-12-08 DIAGNOSIS — M170 Bilateral primary osteoarthritis of knee: Secondary | ICD-10-CM | POA: Diagnosis not present

## 2015-01-15 ENCOUNTER — Ambulatory Visit: Payer: Medicare Other | Admitting: Internal Medicine

## 2015-03-02 ENCOUNTER — Ambulatory Visit: Payer: Medicare Other | Admitting: Internal Medicine

## 2015-03-02 DIAGNOSIS — Z0289 Encounter for other administrative examinations: Secondary | ICD-10-CM

## 2015-07-09 ENCOUNTER — Ambulatory Visit: Payer: Medicare Other | Admitting: Internal Medicine

## 2015-09-01 ENCOUNTER — Ambulatory Visit: Payer: Medicare Other | Admitting: Internal Medicine

## 2015-09-01 DIAGNOSIS — Z0289 Encounter for other administrative examinations: Secondary | ICD-10-CM

## 2015-10-07 ENCOUNTER — Encounter: Payer: Self-pay | Admitting: Internal Medicine

## 2015-10-07 ENCOUNTER — Ambulatory Visit (INDEPENDENT_AMBULATORY_CARE_PROVIDER_SITE_OTHER): Payer: Medicare Other | Admitting: Internal Medicine

## 2015-10-07 VITALS — BP 146/90 | HR 110 | Temp 98.1°F | Resp 20 | Ht 70.0 in | Wt >= 6400 oz

## 2015-10-07 DIAGNOSIS — I2510 Atherosclerotic heart disease of native coronary artery without angina pectoris: Secondary | ICD-10-CM

## 2015-10-07 DIAGNOSIS — M1711 Unilateral primary osteoarthritis, right knee: Secondary | ICD-10-CM

## 2015-10-07 DIAGNOSIS — I10 Essential (primary) hypertension: Secondary | ICD-10-CM

## 2015-10-07 DIAGNOSIS — E662 Morbid (severe) obesity with alveolar hypoventilation: Secondary | ICD-10-CM

## 2015-10-07 MED ORDER — PANTOPRAZOLE SODIUM 40 MG PO TBEC: 40.0000 mg | DELAYED_RELEASE_TABLET | Freq: Every day | ORAL | Status: DC

## 2015-10-07 MED ORDER — METOPROLOL SUCCINATE ER 50 MG PO TB24: 50.0000 mg | ORAL_TABLET | Freq: Every day | ORAL | Status: DC

## 2015-10-07 MED ORDER — FUROSEMIDE 40 MG PO TABS: 40.0000 mg | ORAL_TABLET | Freq: Every day | ORAL | Status: DC

## 2015-10-07 MED ORDER — SIMVASTATIN 20 MG PO TABS: 20.0000 mg | ORAL_TABLET | Freq: Every day | ORAL | Status: DC

## 2015-10-07 MED ORDER — TRAMADOL HCL 50 MG PO TABS: 50.0000 mg | ORAL_TABLET | Freq: Four times a day (QID) | ORAL | Status: DC | PRN

## 2015-10-07 NOTE — Progress Notes (Signed)
Subjective:    Patient ID: John CowmanJimmy D [name], male    DOB: 01/25/1966, 50 y.o.   MRN: [id number]  HPI   50 year old patient who is seen today for follow-up.  He has not been seen in about 10 months.  He has had 2 no-shows since his last visit here.   He has been out of his medications for several months .  He has coronary artery disease, essential hypertension, morbid obesity, osteoarthritis.  He also has a history of impaired glucose tolerance.  He has dyslipidemia .  His cardiac status has been stable  Past Medical History  Diagnosis Date  . HTN (hypertension)   . Sarcoidosis (HCC)   . Morbid obesity (HCC)   . Glucose intolerance (impaired glucose tolerance)   . Uveitis   . Asthma   . Low back pain   . DJD (degenerative joint disease)   . Headache(784.0)   . Depression   . GI bleed   . Helicobacter pylori gastritis   . CAD (coronary artery disease)     nonobstructive -- with LVH but preserved EF     Social History   Social History  . Marital Status: Divorced    Spouse Name: N/A  . Number of Children: 2  . Years of Education: N/A   Occupational History  . DISABILITY    Social History Main Topics  . Smoking status: Never Smoker   . Smokeless tobacco: Never Used  . Alcohol Use: No  . Drug Use: No  . Sexual Activity: Not on file   Other Topics Concern  . Not on file   Social History Narrative    Past Surgical History  Procedure Laterality Date  . Cataract extraction      Family History  Problem Relation Age of Onset  . Colon cancer Neg Hx   . Heart disease Mother   . Diabetes Brother     x2  . Colon polyps Neg Hx   . Kidney disease Neg Hx   . Esophageal cancer Father     Allergies  Allergen Reactions  . Codeine     Current Outpatient Prescriptions on File Prior to Visit  Medication Sig Dispense Refill  . aspirin 81 MG tablet Take 81 mg by mouth daily. Reported on 10/07/2015    . [DISCONTINUED] lisinopril (PRINIVIL,ZESTRIL) 20 MG tablet Take  1 tablet (20 mg total) by mouth daily. 90 tablet 1   No current facility-administered medications on file prior to visit.    BP 146/90 mmHg  Pulse 110  Temp(Src) 98.1 F (36.7 C) (Oral)  Resp 20  Ht 5\' 10"  (1.778 m)  Wt 461 lb (209.108 kg)  BMI 66.15 kg/m2  SpO2 96%     Review of Systems  Constitutional: Negative for fever, chills, appetite change and fatigue.  HENT: Negative for congestion, dental problem, ear pain, hearing loss, sore throat, tinnitus, trouble swallowing and voice change.   Eyes: Negative for pain, discharge and visual disturbance.  Respiratory: Negative for cough, chest tightness, wheezing and stridor.   Cardiovascular: Negative for chest pain, palpitations and leg swelling.  Gastrointestinal: Negative for nausea, vomiting, abdominal pain, diarrhea, constipation, blood in stool and abdominal distention.  Genitourinary: Negative for urgency, hematuria, flank pain, discharge, difficulty urinating and genital sores.  Musculoskeletal: Positive for gait problem. Negative for myalgias, back pain, joint swelling, arthralgias and neck stiffness.       Bilateral knee pain  Skin: Negative for rash.  Neurological: Negative for dizziness, syncope, speech difficulty,  weakness, numbness and headaches.  Hematological: Negative for adenopathy. Does not bruise/bleed easily.  Psychiatric/Behavioral: Negative for behavioral problems and dysphoric mood. The patient is not nervous/anxious.        Objective:   Physical Exam  Constitutional: He is oriented to person, place, and time. He appears well-developed.  Blood pressure 140/90 Weight 461  HENT:  Head: Normocephalic.  Right Ear: External ear normal.  Left Ear: External ear normal.  Eyes: Conjunctivae and EOM are normal.  Neck: Normal range of motion.  Cardiovascular: Normal rate and normal heart sounds.   Pulmonary/Chest: Breath sounds normal.  Abdominal: Bowel sounds are normal.  Musculoskeletal: Normal range of  motion. He exhibits no edema or tenderness.  Mild stasis changes without significant edema  Neurological: He is alert and oriented to person, place, and time.  Psychiatric: He has a normal mood and affect. His behavior is normal.          Assessment & Plan:   .  Coronary artery disease.  Will resume metoprolol and aspirin .  Hypertension  history migraine headaches.  Patient has done well off nortriptyline.  Will discontinue  hypertension, fair control.  Will change furosemide to when necessary lower extremity edema only.  Discontinue potassium supplement  .  Dyslipidemia.  Continue simvastatin.    All medications refilled .  Compliance stressed  CPX scheduled

## 2015-10-07 NOTE — Progress Notes (Signed)
Pre visit review using our clinic review tool, if applicable. No additional management support is needed unless otherwise documented below in the visit note. 

## 2015-10-07 NOTE — Patient Instructions (Signed)
Limit your sodium (Salt) intake  Please check your blood pressure on a regular basis.  If it is consistently greater than 150/90, please make an office appointment.  Return in 3 months for follow-up  

## 2015-10-13 DIAGNOSIS — H20029 Recurrent acute iridocyclitis, unspecified eye: Secondary | ICD-10-CM | POA: Diagnosis not present

## 2015-10-13 DIAGNOSIS — H26493 Other secondary cataract, bilateral: Secondary | ICD-10-CM | POA: Diagnosis not present

## 2015-10-13 DIAGNOSIS — Z961 Presence of intraocular lens: Secondary | ICD-10-CM | POA: Diagnosis not present

## 2016-01-20 ENCOUNTER — Other Ambulatory Visit (INDEPENDENT_AMBULATORY_CARE_PROVIDER_SITE_OTHER): Payer: Medicare Other

## 2016-01-20 DIAGNOSIS — Z0000 Encounter for general adult medical examination without abnormal findings: Secondary | ICD-10-CM | POA: Diagnosis not present

## 2016-01-20 LAB — BASIC METABOLIC PANEL
BUN: 15 mg/dL (ref 6–23)
CO2: 33 meq/L — AB (ref 19–32)
Calcium: 8.8 mg/dL (ref 8.4–10.5)
Chloride: 99 mEq/L (ref 96–112)
Creatinine, Ser: 1.03 mg/dL (ref 0.40–1.50)
GFR: 97.29 mL/min (ref >60.00)
GLUCOSE: 100 mg/dL — AB (ref 70–99)
POTASSIUM: 4.8 meq/L (ref 3.5–5.1)
SODIUM: 137 meq/L (ref 135–145)

## 2016-01-20 LAB — LIPID PANEL
CHOL/HDL RATIO: 4
Cholesterol: 191 mg/dL (ref 0–200)
HDL: 44.1 mg/dL (ref >39.00)
LDL Cholesterol: 135 mg/dL — ABNORMAL HIGH (ref 0–99)
NONHDL: 146.95
Triglycerides: 59 mg/dL (ref 0.0–149.0)
VLDL: 11.8 mg/dL (ref 0.0–40.0)

## 2016-01-20 LAB — CBC WITH DIFFERENTIAL/PLATELET
Basophils Absolute: 0 10*3/uL (ref 0.0–0.1)
Basophils Relative: 0.3 % (ref 0.0–3.0)
EOS PCT: 1.4 % (ref 0.0–5.0)
Eosinophils Absolute: 0.1 10*3/uL (ref 0.0–0.7)
HCT: 44.4 % (ref 39.0–52.0)
Hemoglobin: 14.6 g/dL (ref 13.0–17.0)
LYMPHS ABS: 2.1 10*3/uL (ref 0.7–4.0)
Lymphocytes Relative: 21.8 % (ref 12.0–46.0)
MCHC: 33 g/dL (ref 30.0–36.0)
MCV: 93.7 fl (ref 78.0–100.0)
MONO ABS: 0.7 10*3/uL (ref 0.1–1.0)
MONOS PCT: 7.5 % (ref 3.0–12.0)
NEUTROS ABS: 6.7 10*3/uL (ref 1.4–7.7)
Neutrophils Relative %: 69 % (ref 43.0–77.0)
Platelets: 181 10*3/uL (ref 150.0–400.0)
RBC: 4.74 Mil/uL (ref 4.22–5.81)
RDW: 13.2 % (ref 11.5–15.5)
WBC: 9.7 10*3/uL (ref 4.0–10.5)

## 2016-01-20 LAB — HEPATIC FUNCTION PANEL
ALBUMIN: 3.8 g/dL (ref 3.5–5.2)
ALK PHOS: 64 U/L (ref 39–117)
ALT: 10 U/L (ref 0–53)
AST: 11 U/L (ref 0–37)
BILIRUBIN DIRECT: 0.1 mg/dL (ref 0.0–0.3)
TOTAL PROTEIN: 7.9 g/dL (ref 6.0–8.3)
Total Bilirubin: 0.5 mg/dL (ref 0.2–1.2)

## 2016-01-20 LAB — TSH: TSH: 1.97 u[IU]/mL (ref 0.35–4.50)

## 2016-01-20 LAB — POC URINALSYSI DIPSTICK (AUTOMATED)
Bilirubin, UA: NEGATIVE
Glucose, UA: NEGATIVE
KETONES UA: NEGATIVE
Leukocytes, UA: NEGATIVE
Nitrite, UA: NEGATIVE
PROTEIN UA: NEGATIVE
RBC UA: NEGATIVE
SPEC GRAV UA: 1.015
UROBILINOGEN UA: 0.2
pH, UA: 5.5

## 2016-01-20 LAB — PSA: PSA: 0.23 ng/mL (ref 0.10–4.00)

## 2016-01-27 ENCOUNTER — Encounter: Payer: Medicare Other | Admitting: Internal Medicine

## 2016-02-08 ENCOUNTER — Encounter: Payer: Self-pay | Admitting: Internal Medicine

## 2016-02-08 ENCOUNTER — Ambulatory Visit (INDEPENDENT_AMBULATORY_CARE_PROVIDER_SITE_OTHER): Payer: Medicare Other | Admitting: Internal Medicine

## 2016-02-08 VITALS — BP 140/90 | HR 100 | Temp 98.3°F | Resp 20 | Ht 69.5 in | Wt >= 6400 oz

## 2016-02-08 DIAGNOSIS — Z0000 Encounter for general adult medical examination without abnormal findings: Secondary | ICD-10-CM | POA: Diagnosis not present

## 2016-02-08 DIAGNOSIS — E785 Hyperlipidemia, unspecified: Secondary | ICD-10-CM

## 2016-02-08 DIAGNOSIS — I2510 Atherosclerotic heart disease of native coronary artery without angina pectoris: Secondary | ICD-10-CM | POA: Diagnosis not present

## 2016-02-08 DIAGNOSIS — R7302 Impaired glucose tolerance (oral): Secondary | ICD-10-CM

## 2016-02-08 DIAGNOSIS — I10 Essential (primary) hypertension: Secondary | ICD-10-CM | POA: Diagnosis not present

## 2016-02-08 DIAGNOSIS — Z23 Encounter for immunization: Secondary | ICD-10-CM | POA: Diagnosis not present

## 2016-02-08 LAB — POCT GLUCOSE (DEVICE FOR HOME USE): Glucose Fasting, POC: 102 mg/dL — AB (ref 70–99)

## 2016-02-08 MED ORDER — SIMVASTATIN 20 MG PO TABS: 20.0000 mg | ORAL_TABLET | Freq: Every day | ORAL | 1 refills | Status: DC

## 2016-02-08 MED ORDER — METOPROLOL SUCCINATE ER 50 MG PO TB24: 50.0000 mg | ORAL_TABLET | Freq: Every day | ORAL | 1 refills | Status: DC

## 2016-02-08 MED ORDER — PANTOPRAZOLE SODIUM 40 MG PO TBEC: 40.0000 mg | DELAYED_RELEASE_TABLET | Freq: Every day | ORAL | 3 refills | Status: DC

## 2016-02-08 NOTE — Patient Instructions (Signed)
Follow-up ophthalmology.  Please have ophthalmology give me a call on my mobile phone tomorrow  Limit your sodium (Salt) intake  You need to lose weight.  Consider a lower calorie diet and regular exercise.  Resume medications as discussed  Return in 6 months for follow-up

## 2016-02-08 NOTE — Progress Notes (Signed)
Subjective:    Patient ID: [name], male    DOB: 02-03-1966, 50 y.o.   MRN: [id number]  HPI       CC: Pt here for physical..   History of Present Illness:   50  year-old patient who is seen today for a health maintenance examination.  He was hospitalized in the spring of 2011 on two occasions. In March., he was admitted for dysphasia, and underwent EDG which revealed an esophageal ring that required dilatation. He was hospitalized the following month due to a wide complex tachycardia thought secondary to ventricular tachycardia. He had a heart catheterization at that time, as well as a 2-D echocardiogram. He was discharged on metoprolol. Follow-up cardiology evaluation was not performed. He denies any history of palpitations or syncope.  He has been off all his medications due to cost issues. He has been followed closely by ophthalmology with a history of ocular sarcoid.  He is to be seen by ophthalmology again tomorrow. He did have a negative ColoGuard performed in June 2016.  Repeat screening 3-5 years suggested.  He has a history of nonobstructive CAD, but has been off simvastatin.  His only medication presently is aspirin      Wt Readings from Last 3 Encounters:  02/08/16 (!) 450 lb 4 oz (204.2 kg)  10/07/15 (!) 461 lb (209.1 kg)  12/04/14 (!) 463 lb 8 oz (210.2 kg)    Past Medical History:  Diagnosis Date  . Asthma   . CAD (coronary artery disease)    nonobstructive -- with LVH but preserved EF  . Depression   . DJD (degenerative joint disease)   . GI bleed   . Glucose intolerance (impaired glucose tolerance)   . Headache(784.0)   . Helicobacter pylori gastritis   . HTN (hypertension)   . Low back pain   . Morbid obesity (HCC)   . Sarcoidosis (HCC)   . Uveitis     Social History   Social History  . Marital status: Divorced    Spouse name: N/A  . Number of children: 2  . Years of education: N/A   Occupational History  . DISABILITY Unemployed    Social History Main Topics  . Smoking status: Never Smoker  . Smokeless tobacco: Never Used  . Alcohol use No  . Drug use: No  . Sexual activity: Not on file   Other Topics Concern  . Not on file   Social History Narrative  . No narrative on file    Past Surgical History:  Procedure Laterality Date  . CATARACT EXTRACTION      Family History  Problem Relation Age of Onset  . Colon cancer Neg Hx   . Heart disease Mother   . Diabetes Brother     x2  . Colon polyps Neg Hx   . Kidney disease Neg Hx   . Esophageal cancer Father     Allergies  Allergen Reactions  . Codeine     Current Outpatient Prescriptions on File Prior to Visit  Medication Sig Dispense Refill  . aspirin 81 MG tablet Take 81 mg by mouth daily. Reported on 10/07/2015    . traMADol (ULTRAM) 50 MG tablet Take 1 tablet (50 mg total) by mouth every 6 (six) hours as needed. 60 tablet 0  . furosemide (LASIX) 40 MG tablet Take 1 tablet (40 mg total) by mouth daily. (Patient not taking: Reported on 02/08/2016) 90 tablet 3  . metoprolol succinate (TOPROL XL) 50 MG 24 hr  tablet Take 1 tablet (50 mg total) by mouth daily. (Patient not taking: Reported on 02/08/2016) 90 tablet 1  . pantoprazole (PROTONIX) 40 MG tablet Take 1 tablet (40 mg total) by mouth daily. (Patient not taking: Reported on 02/08/2016) 90 tablet 3  . simvastatin (ZOCOR) 20 MG tablet Take 1 tablet (20 mg total) by mouth at bedtime. (Patient not taking: Reported on 02/08/2016) 90 tablet 1  . [DISCONTINUED] lisinopril (PRINIVIL,ZESTRIL) 20 MG tablet Take 1 tablet (20 mg total) by mouth daily. 90 tablet 1   No current facility-administered medications on file prior to visit.     BP 140/90 (BP Location: Left Arm, Patient Position: Sitting, Cuff Size: Large)   Pulse 100   Temp 98.3 F (36.8 C) (Oral)   Resp 20   Ht 5' 9.5" (1.765 m)   Wt (!) 450 lb 4 oz (204.2 kg)   SpO2 95%   BMI 65.54 kg/m      Here for Medicare AWV:   1. Risk factors  based on Past M, S, F history: risk factors include history of hypertension, morbid obesity, and family history. He also has a history of impaired glucose tolerance; He has a history of nonobstructive coronary artery disease 2. Physical Activities: limited due to obesity and knee pain 3. Depression/mood: history mild depression in the past, but no recent treatment  4. Hearing: no deficits  5. ADL's: independent in aspects of daily living  6. Fall Risk: moderate due to obesity  7. Home Safety: no problems identified  8. Height, weight, &visual acuity:height and weight stable. History of morbid obesity, blind right eye;  history of ocular sarcoid, status post bilateral cataract extraction surgery 9. Counseling: weight loss, more regular exercise, and heart healthy diet. All discussed  10. Labs ordered based on risk factors: will check a lipid profile, and hemoglobin A1c  11. Referral Coordination- will schedule for follow-up cardiology  12. Care Plan- will resume metoprolol and all  his medications 13. Cognitive Assessment- alert and oriented normal affect. No cognitive dysfunction  14.  He will require periodic eye examinations by ophthalmology due to his sarcoidosis.  He will have annual clinical exams by primary care. Patient was provided with a written and personalized care plan 15.  Provider list updated.  Includes ophthalmology primary care.    Allergies:  1) ! Codeine   Past History:  Past Medical History:   Hypertension  Sarcoidosis  FOB 08/11/05 pos ncg with airway involvemnt, steroid dep since 3/07  Mobid obesity, wt 340 when started prednisone  history glucose intolerance  ocular sarcoid ( uveitis)  blind right eye Asthma  Low back pain  right knee, DJD  Headache  depression  upper GI bleed , dysphagia secondary esopohageal ring March 2011  H. pylori gastritis  h/o WCT 4-11   Past Surgical History:  status post left cataract surgery  cardiac cath 4-11  EGD 3-11    Family History:   father died age 86, history of cerebral vascular disease; history of laryngeal cancer  Mother, age 65 has coronary artery disease ; DJD, status post pacemaker insertion  Five brothers, positive for diabetes; one deceased, unknown causes   Social History:   Patient never smoked.  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  HENT: Negative for congestion,  dental problem, ear pain, hearing loss, mouth sores, rhinorrhea, sinus pressure, sneezing, tinnitus, trouble swallowing and voice change.   Eyes: Negative for photophobia, pain, redness and visual disturbance.       Blind right eye  Respiratory: Negative for apnea, cough, choking, chest tightness, shortness of breath and wheezing.   Cardiovascular: Positive for leg swelling. Negative for chest pain and palpitations.  Gastrointestinal: Negative for abdominal distention, abdominal pain, anal bleeding, blood in stool, constipation, diarrhea, nausea, rectal pain and vomiting.  Genitourinary: Negative for decreased urine volume, difficulty urinating, discharge, dysuria, flank pain, frequency, genital sores, hematuria, penile swelling, scrotal swelling, testicular pain and urgency.  Musculoskeletal: Positive for gait problem. Negative for arthralgias (Right knee pain), back pain, joint swelling, myalgias, neck pain and neck stiffness.  Skin: Negative for color change, rash and wound.  Neurological: Negative for dizziness, tremors, seizures, syncope, facial asymmetry, speech difficulty, weakness, light-headedness, numbness and headaches.  Hematological: Negative for adenopathy. Does not bruise/bleed easily.  Psychiatric/Behavioral: Negative for agitation, behavioral problems, confusion, decreased concentration, dysphoric mood,  hallucinations, self-injury, sleep disturbance and suicidal ideas. The patient is not nervous/anxious.        Objective:   Physical Exam  Constitutional: He is oriented to person, place, and time. He appears well-developed.  Morbidly obese blood pressure 150/90 Weight 450  HENT:  Head: Normocephalic.  Right Ear: External ear normal.  Left Ear: External ear normal.  Eyes: Conjunctivae and EOM are normal.  Status post bilateral cataract extractions  Neck: Normal range of motion.  Cardiovascular: Normal rate, regular rhythm, normal heart sounds and intact distal pulses.  Exam reveals no gallop.   No murmur heard. Diminished right dorsalis pedis pulse  Pulmonary/Chest: Effort normal and breath sounds normal. No respiratory distress.  Abdominal: Soft. Bowel sounds are normal. He exhibits no distension. There is no tenderness. There is no rebound.  Marked obesity  Genitourinary: Penis normal.  Musculoskeletal: Normal range of motion. He exhibits edema and tenderness.  Trace lower extremity edema with dry skin    Neurological: He is alert and oriented to person, place, and time.  Skin:  Plus 2 lower extremity edema with stasis changes  Psychiatric: He has a normal mood and affect. His behavior is normal.          Assessment & Plan:   Preventive health examination  Morbid obesity Hypertension- blood pressure  140/90 today off medications. Will resume medications History of ventricular tachycardia. Will resume metoprolol  History of ocular sarcoid.  Follow-up ophthalmology Chronic venous insufficiency Dyslipidemia.  resume statin therapy Nonobstructive CAD. We'll continue efforts at aggressive risk factor modification  Compliance with his medications discussed.  New prescription refills printed out Return in 6 months for follow-up Weight loss encouraged   Rogelia BogaKWIATKOWSKI,PETER FRANK

## 2016-02-08 NOTE — Progress Notes (Signed)
Pre visit review using our clinic review tool, if applicable. No additional management support is needed unless otherwise documented below in the visit note. 

## 2016-06-29 DIAGNOSIS — D869 Sarcoidosis, unspecified: Secondary | ICD-10-CM | POA: Diagnosis not present

## 2016-06-29 DIAGNOSIS — H43811 Vitreous degeneration, right eye: Secondary | ICD-10-CM | POA: Diagnosis not present

## 2016-06-29 DIAGNOSIS — H20029 Recurrent acute iridocyclitis, unspecified eye: Secondary | ICD-10-CM | POA: Diagnosis not present

## 2016-06-29 DIAGNOSIS — D8683 Sarcoid iridocyclitis: Secondary | ICD-10-CM | POA: Diagnosis not present

## 2016-07-11 ENCOUNTER — Ambulatory Visit: Payer: Medicare Other | Admitting: Internal Medicine

## 2016-07-14 ENCOUNTER — Encounter: Payer: Self-pay | Admitting: Internal Medicine

## 2016-07-14 ENCOUNTER — Ambulatory Visit (INDEPENDENT_AMBULATORY_CARE_PROVIDER_SITE_OTHER): Payer: Medicare Other | Admitting: Internal Medicine

## 2016-07-14 VITALS — BP 154/82 | HR 100 | Temp 98.3°F | Ht 69.5 in | Wt >= 6400 oz

## 2016-07-14 DIAGNOSIS — I10 Essential (primary) hypertension: Secondary | ICD-10-CM

## 2016-07-14 DIAGNOSIS — R7302 Impaired glucose tolerance (oral): Secondary | ICD-10-CM

## 2016-07-14 DIAGNOSIS — D869 Sarcoidosis, unspecified: Secondary | ICD-10-CM

## 2016-07-14 DIAGNOSIS — E785 Hyperlipidemia, unspecified: Secondary | ICD-10-CM

## 2016-07-14 NOTE — Progress Notes (Signed)
Pre visit review using our clinic review tool, if applicable. No additional management support is needed unless otherwise documented below in the visit note. 

## 2016-07-14 NOTE — Patient Instructions (Signed)
Limit your sodium (Salt) intake    It is important that you exercise regularly, at least 20 minutes 3 to 4 times per week.  If you develop chest pain or shortness of breath seek  medical attention.  Please check your blood pressure on a regular basis.  If it is consistently greater than 150/90, please make an office appointment.  You need to lose weight.  Consider a lower calorie diet and regular exercise. 

## 2016-07-14 NOTE — Progress Notes (Signed)
Subjective:    Patient ID: John CowmanJimmy D [name], male    DOB: 03/15/1966, 51 y.o.   MRN: [id number]  HPI  51 year old patient who has essential hypertension.  He also has a history of nonsustained V. Tach and is on beta blocker therapy.  He has morbid obesity and a history of very mild impaired glucose tolerance.  Doing quite well today without concerns or complaints.  He does continue to monitor home blood pressure monitor is at his local drug store with nice results. He has been seen recently by ophthalmology due to ocular sarcoidosis. Denies any cardiopulmonary complaints  Past Medical History:  Diagnosis Date  . Asthma   . CAD (coronary artery disease)    nonobstructive -- with LVH but preserved EF  . Depression   . DJD (degenerative joint disease)   . GI bleed   . Glucose intolerance (impaired glucose tolerance)   . Headache(784.0)   . Helicobacter pylori gastritis   . HTN (hypertension)   . Low back pain   . Morbid obesity (HCC)   . Sarcoidosis (HCC)   . Uveitis      Social History   Social History  . Marital status: Divorced    Spouse name: N/A  . Number of children: 2  . Years of education: N/A   Occupational History  . DISABILITY Unemployed   Social History Main Topics  . Smoking status: Never Smoker  . Smokeless tobacco: Never Used  . Alcohol use No  . Drug use: No  . Sexual activity: Not on file   Other Topics Concern  . Not on file   Social History Narrative  . No narrative on file    Past Surgical History:  Procedure Laterality Date  . CATARACT EXTRACTION      Family History  Problem Relation Age of Onset  . Colon cancer Neg Hx   . Heart disease Mother   . Diabetes Brother     x2  . Colon polyps Neg Hx   . Kidney disease Neg Hx   . Esophageal cancer Father     Allergies  Allergen Reactions  . Codeine     Current Outpatient Prescriptions on File Prior to Visit  Medication Sig Dispense Refill  . aspirin 81 MG tablet Take 81 mg by  mouth daily. Reported on 10/07/2015    . furosemide (LASIX) 40 MG tablet Take 1 tablet (40 mg total) by mouth daily. 90 tablet 3  . metoprolol succinate (TOPROL XL) 50 MG 24 hr tablet Take 1 tablet (50 mg total) by mouth daily. 90 tablet 1  . pantoprazole (PROTONIX) 40 MG tablet Take 1 tablet (40 mg total) by mouth daily. 90 tablet 3  . simvastatin (ZOCOR) 20 MG tablet Take 1 tablet (20 mg total) by mouth at bedtime. 90 tablet 1  . traMADol (ULTRAM) 50 MG tablet Take 1 tablet (50 mg total) by mouth every 6 (six) hours as needed. 60 tablet 0  . [DISCONTINUED] lisinopril (PRINIVIL,ZESTRIL) 20 MG tablet Take 1 tablet (20 mg total) by mouth daily. 90 tablet 1   No current facility-administered medications on file prior to visit.     BP (!) 154/82 (BP Location: Right Arm, Patient Position: Sitting, Cuff Size: Large)   Pulse 100   Temp 98.3 F (36.8 C) (Oral)   Ht 5' 9.5" (1.765 m)   Wt (!) 459 lb 9.6 oz (208.5 kg)   SpO2 91%   BMI 66.90 kg/m     Review of Systems  Constitutional: Negative for appetite change, chills, fatigue and fever.  HENT: Negative for congestion, dental problem, ear pain, hearing loss, sore throat, tinnitus, trouble swallowing and voice change.   Eyes: Negative for pain, discharge and visual disturbance.  Respiratory: Negative for cough, chest tightness, wheezing and stridor.   Cardiovascular: Negative for chest pain, palpitations and leg swelling.  Gastrointestinal: Negative for abdominal distention, abdominal pain, blood in stool, constipation, diarrhea, nausea and vomiting.  Genitourinary: Negative for difficulty urinating, discharge, flank pain, genital sores, hematuria and urgency.  Musculoskeletal: Negative for arthralgias, back pain, gait problem, joint swelling, myalgias and neck stiffness.  Skin: Negative for rash.  Neurological: Negative for dizziness, syncope, speech difficulty, weakness, numbness and headaches.  Hematological: Negative for adenopathy. Does  not bruise/bleed easily.  Psychiatric/Behavioral: Negative for behavioral problems and dysphoric mood. The patient is not nervous/anxious.        Objective:   Physical Exam  Constitutional: He is oriented to person, place, and time. He appears well-developed.  Weight 459 Repeat blood pressure 136/84  HENT:  Head: Normocephalic.  Right Ear: External ear normal.  Left Ear: External ear normal.  Eyes: Conjunctivae and EOM are normal.  Neck: Normal range of motion.  Cardiovascular: Normal rate and normal heart sounds.   Pulmonary/Chest: Breath sounds normal.  Abdominal: Bowel sounds are normal.  Musculoskeletal: Normal range of motion. He exhibits edema. He exhibits no tenderness.  Plus 1 distal lower extremity edema  Neurological: He is alert and oriented to person, place, and time.  Psychiatric: He has a normal mood and affect. His behavior is normal.          Assessment & Plan:   Morbid obesity Impaired glucose tolerance Essential hypertension.  Reasonable control.  Continue beta blocker and diuretic therapy Dyslipidemia.  Continue simvastatin  No change in medical regimen Annual exam in the fall Weight loss encouraged Home blood pressure monitoring recommended  Ophthalmology follow-up  Rogelia Boga

## 2016-07-20 DIAGNOSIS — H20012 Primary iridocyclitis, left eye: Secondary | ICD-10-CM | POA: Diagnosis not present

## 2016-07-20 DIAGNOSIS — H4312 Vitreous hemorrhage, left eye: Secondary | ICD-10-CM | POA: Diagnosis not present

## 2016-07-20 DIAGNOSIS — H35372 Puckering of macula, left eye: Secondary | ICD-10-CM | POA: Diagnosis not present

## 2016-07-20 DIAGNOSIS — H43811 Vitreous degeneration, right eye: Secondary | ICD-10-CM | POA: Diagnosis not present

## 2016-08-08 ENCOUNTER — Ambulatory Visit: Payer: Medicare Other | Admitting: Internal Medicine

## 2016-08-17 DIAGNOSIS — H3581 Retinal edema: Secondary | ICD-10-CM | POA: Diagnosis not present

## 2016-08-17 DIAGNOSIS — H43811 Vitreous degeneration, right eye: Secondary | ICD-10-CM | POA: Diagnosis not present

## 2016-08-17 DIAGNOSIS — H35372 Puckering of macula, left eye: Secondary | ICD-10-CM | POA: Diagnosis not present

## 2016-08-17 DIAGNOSIS — H4312 Vitreous hemorrhage, left eye: Secondary | ICD-10-CM | POA: Diagnosis not present

## 2016-08-23 ENCOUNTER — Telehealth: Payer: Self-pay | Admitting: Internal Medicine

## 2016-08-23 NOTE — Telephone Encounter (Signed)
Pt had ColoGuard on 10/30/14, pt called and informed that this study is done every 3 yrs.  Ok to order another ColoGuard? Please advise.

## 2016-08-23 NOTE — Telephone Encounter (Signed)
Pt states he received an automated call stating it was time for his colon cancer screening. Pt states he did not think it has been 5 yrs yet, but wants to know how Dr Kirtland BouchardK wants him to proceed.

## 2016-08-24 NOTE — Telephone Encounter (Signed)
Spoke with pt and informed him to have a repeat Cologuard after June 2019. Pt verbalized understanding

## 2016-08-24 NOTE — Telephone Encounter (Signed)
Suggest repeat in June 2019

## 2016-09-09 ENCOUNTER — Ambulatory Visit: Payer: Medicare Other | Admitting: Internal Medicine

## 2016-09-21 DIAGNOSIS — H43811 Vitreous degeneration, right eye: Secondary | ICD-10-CM | POA: Diagnosis not present

## 2016-09-21 DIAGNOSIS — H3581 Retinal edema: Secondary | ICD-10-CM | POA: Diagnosis not present

## 2016-09-21 DIAGNOSIS — H35372 Puckering of macula, left eye: Secondary | ICD-10-CM | POA: Diagnosis not present

## 2016-09-21 DIAGNOSIS — H20012 Primary iridocyclitis, left eye: Secondary | ICD-10-CM | POA: Diagnosis not present

## 2016-12-11 ENCOUNTER — Other Ambulatory Visit: Payer: Self-pay | Admitting: Internal Medicine

## 2016-12-12 ENCOUNTER — Ambulatory Visit (INDEPENDENT_AMBULATORY_CARE_PROVIDER_SITE_OTHER): Payer: Medicare Other | Admitting: Internal Medicine

## 2016-12-12 ENCOUNTER — Encounter: Payer: Self-pay | Admitting: Internal Medicine

## 2016-12-12 VITALS — BP 168/92 | HR 82 | Temp 98.1°F | Ht 69.5 in | Wt >= 6400 oz

## 2016-12-12 DIAGNOSIS — I10 Essential (primary) hypertension: Secondary | ICD-10-CM

## 2016-12-12 DIAGNOSIS — G4733 Obstructive sleep apnea (adult) (pediatric): Secondary | ICD-10-CM | POA: Diagnosis not present

## 2016-12-12 DIAGNOSIS — H8110 Benign paroxysmal vertigo, unspecified ear: Secondary | ICD-10-CM | POA: Diagnosis not present

## 2016-12-12 MED ORDER — FUROSEMIDE 40 MG PO TABS: 40.0000 mg | ORAL_TABLET | Freq: Every day | ORAL | 2 refills | Status: DC

## 2016-12-12 MED ORDER — MECLIZINE HCL 25 MG PO CHEW: 25.0000 mg | CHEWABLE_TABLET | Freq: Four times a day (QID) | ORAL | 2 refills | Status: DC | PRN

## 2016-12-12 MED ORDER — METOPROLOL SUCCINATE ER 50 MG PO TB24: 50.0000 mg | ORAL_TABLET | Freq: Every day | ORAL | 3 refills | Status: DC

## 2016-12-12 NOTE — Progress Notes (Signed)
Subjective:    Patient ID: [name], male    DOB: 09-Dec-1965, 51 y.o.   MRN: [id number]  HPI  51 year old patient who presents with a four-day history of positional vertigo.  He describes a spinning sensation and sense of being off balance with movement.  No prior history of vertigo. He does have a history of essential hypertension.  Patient has been encouraged to have OSA screening in the past.  He does have a son requiring CPAP.  Admits to loud snoring but no real daytime sleepiness.  Patient does have a history of hypertension and nonobstructive coronary artery disease.  Denies daytime napping  Past Medical History:  Diagnosis Date  . Asthma   . CAD (coronary artery disease)    nonobstructive -- with LVH but preserved EF  . Depression   . DJD (degenerative joint disease)   . GI bleed   . Glucose intolerance (impaired glucose tolerance)   . Headache(784.0)   . Helicobacter pylori gastritis   . HTN (hypertension)   . Low back pain   . Morbid obesity (HCC)   . Sarcoidosis   . Uveitis      Social History   Social History  . Marital status: Divorced    Spouse name: N/A  . Number of children: 2  . Years of education: N/A   Occupational History  . DISABILITY Unemployed   Social History Main Topics  . Smoking status: Never Smoker  . Smokeless tobacco: Never Used  . Alcohol use No  . Drug use: No  . Sexual activity: Not on file   Other Topics Concern  . Not on file   Social History Narrative  . No narrative on file    Past Surgical History:  Procedure Laterality Date  . CATARACT EXTRACTION      Family History  Problem Relation Age of Onset  . Colon cancer Neg Hx   . Heart disease Mother   . Diabetes Brother        x2  . Colon polyps Neg Hx   . Kidney disease Neg Hx   . Esophageal cancer Father     Allergies  Allergen Reactions  . Codeine     Current Outpatient Prescriptions on File Prior to Visit  Medication Sig Dispense Refill  .  aspirin 81 MG tablet Take 81 mg by mouth daily. Reported on 10/07/2015    . traMADol (ULTRAM) 50 MG tablet Take 1 tablet (50 mg total) by mouth every 6 (six) hours as needed. 60 tablet 0  . pantoprazole (PROTONIX) 40 MG tablet Take 1 tablet (40 mg total) by mouth daily. (Patient not taking: Reported on 12/12/2016) 90 tablet 3  . simvastatin (ZOCOR) 20 MG tablet Take 1 tablet (20 mg total) by mouth at bedtime. (Patient not taking: Reported on 12/12/2016) 90 tablet 1  . [DISCONTINUED] lisinopril (PRINIVIL,ZESTRIL) 20 MG tablet Take 1 tablet (20 mg total) by mouth daily. 90 tablet 1   No current facility-administered medications on file prior to visit.     BP (!) 168/92 (BP Location: Left Arm, Patient Position: Sitting, Cuff Size: Normal)   Pulse 82   Temp 98.1 F (36.7 C) (Oral)   Ht 5' 9.5" (1.765 m)   Wt (!) 450 lb 9.6 oz (204.4 kg)   SpO2 95%   BMI 65.59 kg/m     Review of Systems  Constitutional: Negative for appetite change, chills, fatigue and fever.  HENT: Negative for congestion, dental problem, ear pain, hearing loss,  sore throat, tinnitus, trouble swallowing and voice change.   Eyes: Negative for pain, discharge and visual disturbance.  Respiratory: Negative for cough, chest tightness, wheezing and stridor.   Cardiovascular: Negative for chest pain, palpitations and leg swelling.  Gastrointestinal: Negative for abdominal distention, abdominal pain, blood in stool, constipation, diarrhea, nausea and vomiting.  Genitourinary: Negative for difficulty urinating, discharge, flank pain, genital sores, hematuria and urgency.  Musculoskeletal: Negative for arthralgias, back pain, gait problem, joint swelling, myalgias and neck stiffness.  Skin: Negative for rash.  Neurological: Positive for dizziness and light-headedness. Negative for syncope, speech difficulty, weakness, numbness and headaches.  Hematological: Negative for adenopathy. Does not bruise/bleed easily.    Psychiatric/Behavioral: Negative for behavioral problems and dysphoric mood. The patient is not nervous/anxious.        Objective:   Physical Exam  Constitutional: He is oriented to person, place, and time. He appears well-developed. No distress.  Repeat blood pressure 140/88  Weight 450  HENT:  Head: Normocephalic.  Right Ear: External ear normal.  Left Ear: External ear normal.  Mouth/Throat: Oropharynx is clear and moist.  Pharyngeal crowding  Eyes: Pupils are equal, round, and reactive to light. EOM are normal.  Neck: Neck supple.  Cardiovascular: Normal rate and regular rhythm.   Neurological: He is alert and oriented to person, place, and time. No cranial nerve deficit. Coordination normal.          Assessment & Plan:   Benign positional vertigo.  Patient information dispensed.  Prescription for meclizine written.  Patient information instructions for repositioning maneuvers.  Discussed. Morbid obesity/pharyngeal crowding.  Patient is now agreeable for OSA screening.  We'll schedule  Rogelia BogaKWIATKOWSKI,Rohit Deloria FRANK

## 2016-12-12 NOTE — Patient Instructions (Addendum)

## 2016-12-20 DIAGNOSIS — H35372 Puckering of macula, left eye: Secondary | ICD-10-CM | POA: Diagnosis not present

## 2016-12-20 DIAGNOSIS — H43392 Other vitreous opacities, left eye: Secondary | ICD-10-CM | POA: Diagnosis not present

## 2016-12-20 DIAGNOSIS — H20012 Primary iridocyclitis, left eye: Secondary | ICD-10-CM | POA: Diagnosis not present

## 2016-12-20 DIAGNOSIS — H43811 Vitreous degeneration, right eye: Secondary | ICD-10-CM | POA: Diagnosis not present

## 2016-12-21 ENCOUNTER — Other Ambulatory Visit: Payer: Self-pay

## 2016-12-21 ENCOUNTER — Telehealth: Payer: Self-pay | Admitting: Internal Medicine

## 2016-12-21 MED ORDER — MECLIZINE HCL 25 MG PO CHEW: 25.0000 mg | CHEWABLE_TABLET | Freq: Four times a day (QID) | ORAL | 2 refills | Status: DC | PRN

## 2016-12-21 NOTE — Telephone Encounter (Signed)
° °  Pt RX went to the wrong pharmacy he no longer use   [facility] [address]  Please resend to below pharmacy     Pt request refill of the following:   Meclizine HCl 25 MG CHEW    [facility]  [address]

## 2016-12-21 NOTE — Telephone Encounter (Signed)
Cancelled the Rx for Meclizine and [facility] [address] [facility].

## 2016-12-29 MED ORDER — MECLIZINE HCL 25 MG PO CHEW: 25.0000 mg | CHEWABLE_TABLET | Freq: Four times a day (QID) | ORAL | 2 refills | Status: AC | PRN

## 2016-12-29 MED ORDER — METOPROLOL SUCCINATE ER 50 MG PO TB24: 50.0000 mg | ORAL_TABLET | Freq: Every day | ORAL | 3 refills | Status: DC

## 2016-12-29 NOTE — Telephone Encounter (Signed)
Pt states [facility] [address] never received the  Meclizine HCl 25 MG CHEW  ALSO, pt needs the  metoprolol succinate (TOPROL-XL) 50 MG 24 hr tablet  It was sent to the wrong pharmacy.too. Can you please resend both of these ?  [id number] - [place] ([address] [place] - [address]  [facility] take off the [facility] pt.  Thanks!

## 2016-12-29 NOTE — Addendum Note (Signed)
Addended by: Neville RouteJAIMES, Dessirae Scarola G on: 12/29/2016 11:38 AM   Modules accepted: Orders

## 2017-01-20 DIAGNOSIS — H43392 Other vitreous opacities, left eye: Secondary | ICD-10-CM | POA: Diagnosis not present

## 2017-01-20 DIAGNOSIS — H43811 Vitreous degeneration, right eye: Secondary | ICD-10-CM | POA: Diagnosis not present

## 2017-01-20 DIAGNOSIS — H44113 Panuveitis, bilateral: Secondary | ICD-10-CM | POA: Diagnosis not present

## 2017-01-20 DIAGNOSIS — H35372 Puckering of macula, left eye: Secondary | ICD-10-CM | POA: Diagnosis not present

## 2017-02-14 ENCOUNTER — Ambulatory Visit: Payer: Medicare Other | Admitting: Internal Medicine

## 2017-02-14 DIAGNOSIS — Z0289 Encounter for other administrative examinations: Secondary | ICD-10-CM

## 2017-02-24 NOTE — Progress Notes (Signed)
Office Visit Note  Patient: [name]             Date of Birth: Dec 30, 1965           MRN: [id number]             PCP: Marletta Lor, MD Referring: Rosalva Ferron, M.D. Visit Date: 03/03/2017 Occupation: @GUAROCC @    Subjective:  Recurrent iritis.   History of Present Illness: [name] is a 51 y.o. male seen in consultation per request of Dr. Rosalva Ferron for evaluation of recurrent iritis. According to patient the symptoms is started in 2007 with eye problems since then he has had recurrent iritis treated with prednisone eyedrops. He states sometimes he is unable to afford his medications. He also has history of arthralgias involving multiple joints for several years. He denies any joint swelling. He does not recall seeing a rheumatologist.  Activities of Daily Living:  Patient reports morning stiffness for 20 minutes.   Patient Reports nocturnal pain.  Difficulty dressing/grooming: Reports Difficulty climbing stairs: Denies Difficulty getting out of chair: Denies Difficulty using hands for taps, buttons, cutlery, and/or writing: Denies   Review of Systems  Constitutional: Positive for fatigue. Negative for night sweats and weakness.  HENT: Negative for mouth sores, mouth dryness and nose dryness.   Eyes: Positive for discharge. Negative for redness and dryness.  Respiratory: Negative.  Negative for shortness of breath and difficulty breathing.   Cardiovascular: Positive for swelling in legs/feet. Negative for chest pain, palpitations, hypertension and irregular heartbeat.  Gastrointestinal: Negative.  Negative for constipation and diarrhea.  Endocrine: Negative for increased urination.  Musculoskeletal: Positive for arthralgias, joint pain, joint swelling, muscle weakness and morning stiffness. Negative for myalgias, muscle tenderness and myalgias.  Skin: Positive for rash. Negative for color change, hair loss, nodules/bumps, skin tightness, ulcers  and sensitivity to sunlight.  Allergic/Immunologic: Negative for susceptible to infections.  Neurological: Positive for dizziness. Negative for fainting, memory loss and night sweats.  Hematological: Negative for swollen glands.  Psychiatric/Behavioral: Positive for sleep disturbance. Negative for depressed mood. The patient is not nervous/anxious.     PMFS History:  Patient Active Problem List   Diagnosis Date Noted  . Osteoarthritis of right knee 12/04/2014  . Left ventricular hypertrophy 06/04/2012  . History of migraine headaches 06/04/2012  . Coronary atherosclerosis 06/16/2010  . VENTRICULAR TACHYCARDIA, PAROXYSMAL 06/11/2010  . ACUTE PROSTATITIS 12/03/2009  . ABDOMINAL PAIN, GENERALIZED 12/03/2009  . HELICOBACTER PYLORI Rote INFECTION 09/07/2009  . ANXIETY DEPRESSION 12/18/2008  . HEADACHE 08/04/2008  . ABDOMINAL PAIN 11/20/2007  . LEG PAIN, BILATERAL 09/13/2007  . ASTHMA 06/14/2007  . LOW BACK PAIN 06/14/2007  . Glucose intolerance (impaired glucose tolerance) 06/14/2007  . Dyslipidemia 04/10/2007  . MORBID OBESITY 04/10/2007  . SARCOIDOSIS 10/05/2006  . Essential hypertension 10/05/2006    Past Medical History:  Diagnosis Date  . Asthma   . CAD (coronary artery disease)    nonobstructive -- with LVH but preserved EF  . Depression   . DJD (degenerative joint disease)   . GI bleed   . Glucose intolerance (impaired glucose tolerance)   . Headache(784.0)   . Helicobacter pylori gastritis   . HTN (hypertension)   . Low back pain   . Morbid obesity ([place])   . Sarcoidosis   . Uveitis     Family History  Problem Relation Age of Onset  . Colon cancer Neg Hx   . Heart disease Mother   . Diabetes Brother  x2  . Colon polyps Neg Hx   . Kidney disease Neg Hx   . Esophageal cancer Father    Past Surgical History:  Procedure Laterality Date  . CATARACT EXTRACTION     Social History   Social History Narrative  . No narrative on file      Objective: Vital Signs: BP (!) 179/85 (BP Location: Left Arm, Patient Position: Sitting, Cuff Size: Normal)   Ht 5' 9"  (1.753 m)   Wt (!) 450 lb (204.1 kg)   BMI 66.45 kg/m    Physical Exam  Constitutional: He is oriented to person, place, and time. He appears well-developed and well-nourished.  HENT:  Head: Normocephalic and atraumatic.  Poor dentition and poor dental hygiene  Eyes: Pupils are equal, round, and reactive to light. EOM are normal.  Bilateral conjunctival injection  Neck: Normal range of motion. Neck supple.  Cardiovascular: Normal rate, regular rhythm and normal heart sounds.   Pulmonary/Chest: Effort normal and breath sounds normal.  Abdominal: Soft. Bowel sounds are normal.  Neurological: He is alert and oriented to person, place, and time.  Skin: Skin is warm and dry. Capillary refill takes less than 2 seconds.  Psychiatric: He has a normal mood and affect. His behavior is normal.  Nursing note and vitals reviewed.    Musculoskeletal Exam: C-spine and thoracic lumbar spine good range of motion. No SI joint tenderness. Shoulder joints elbow joints wrist joint MCPs PIPs DIPs with good range of motion with no synovitis. He had discomfort with range of motion of bilateral knee joints ankle joints and feet but no warmth swelling or effusion was noted. No synovitis was noted on examination.  CDAI Exam: No CDAI exam completed.    Investigation: Findings:  07/20/2016 normal CBC Lyme IgG IgM negative,ANA negative, RPR Non Reactive, Sed Rate elevated 34, CRP 17.8    Imaging: Xr Foot 2 Views Left  Result Date: 03/03/2017 No MTP PIP/DIP narrowing was noted. No intertarsal joint space narrowing was noted. No erosive changes were noted. Impression: Normal x-ray of the foot.  Xr Foot 2 Views Right  Result Date: 03/03/2017 No MTP PIP/DIP narrowing was noted. No intertarsal joint space narrowing was noted. No erosive changes were noted. Impression: Normal x-ray of  the foot.  Xr Knee 3 View Left  Result Date: 03/03/2017 Severe medial compartment narrowing with lateral osteophytes intercondylar osteophytes no chondrocalcinosis was noted. Severe patellofemoral narrowing was noted. Impression: Severe osteoarthritis of knee joint and severe chondromalacia patella.  Xr Knee 3 View Right  Result Date: 03/03/2017 Severe end-stage osteoarthritis with severe medial compartment narrowing. Lateral osteophytes were noted. No chondrocalcinosis was noted. Moderate patellofemoral narrowing was noted. Impression: severe osteoarthritis of the knee with moderate chondromalacia patella   Speciality Comments: No specialty comments available.    Procedures:  No procedures performed Allergies: Codeine   Assessment / Plan:     Visit Diagnoses: Iritis -patient gives history of recurrent iritis for multiple years. He still has some conjunctival injection in his eyes. Plan: Pan-ANCA  Other fatigue - Plan: COMPLETE METABOLIC PANEL WITH GFR, Urinalysis, Routine w reflex microscopic, Sedimentation rate, TSH, CK, Serum protein electrophoresis with reflex, IgG, IgA, IgM  Chronic pain of both knees - he had a warmth swelling or effusion is been joints. Plan: XR KNEE 3 VIEW RIGHT, XR KNEE 3 VIEW LEFT. X-ray of the knee joints reveals severe end-stage osteoarthritis of bilateral knee joints with severe chondromalacia patella  Pain in both feet - he complains of feet pain  and also discomfort with mobility. I will obtain following labs and x-rays today. Plan: Rheumatoid factor, Cyclic citrul peptide antibody, IgG, Angiotensin converting enzyme, XR Foot 2 Views Right, XR Foot 2 Views Left x-ray of bilateral feet were unremarkable.  I could not obtain HLA-B27 as  is it is not covered by his insurance.   Screening for tuberculosis - Plan: Quantiferon tb gold assay (blood)  Need for hepatitis B screening test - Plan: Hepatitis B core antibody, IgM, Hepatitis B surface antigen  Need  for hepatitis C screening test - Plan: Hepatitis C antibody  History of diabetes mellitus  History of hypertension: His blood pressure is elevated today. I've advised him to monitor his blood pressure closely and follow up with his PCP.  History of hyperlipidemia  History of coronary artery disease  History of migraine headaches  History of angioedema    Orders: Orders Placed This Encounter  Procedures  . XR KNEE 3 VIEW RIGHT  . XR KNEE 3 VIEW LEFT  . XR Foot 2 Views Right  . XR Foot 2 Views Left  . COMPLETE METABOLIC PANEL WITH GFR  . Urinalysis, Routine w reflex microscopic  . Sedimentation rate  . TSH  . CK  . Rheumatoid factor  . Cyclic citrul peptide antibody, IgG  . Angiotensin converting enzyme  . Serum protein electrophoresis with reflex  . IgG, IgA, IgM  . HLA-B27 antigen  . Quantiferon tb gold assay (blood)  . Hepatitis B core antibody, IgM  . Hepatitis B surface antigen  . Hepatitis C antibody  . Pan-ANCA   No orders of the defined types were placed in this encounter.   Face-to-face time spent with patient was 50 minutes. Greater than 50% of time was spent in counseling and coordination of care.  Follow-Up Instructions: Return for Iritis, arthralgia, .   Bo Merino, MD  Note - This record has been created using Editor, commissioning.  Chart creation errors have been sought, but may not always  have been located. Such creation errors do not reflect on  the standard of medical care.

## 2017-03-03 ENCOUNTER — Ambulatory Visit: Payer: Medicare Other | Admitting: Rheumatology

## 2017-03-03 ENCOUNTER — Ambulatory Visit (INDEPENDENT_AMBULATORY_CARE_PROVIDER_SITE_OTHER): Payer: Medicare Other

## 2017-03-03 VITALS — BP 179/85 | Ht 69.0 in | Wt >= 6400 oz

## 2017-03-03 DIAGNOSIS — M79671 Pain in right foot: Secondary | ICD-10-CM

## 2017-03-03 DIAGNOSIS — G8929 Other chronic pain: Secondary | ICD-10-CM | POA: Diagnosis not present

## 2017-03-03 DIAGNOSIS — M25562 Pain in left knee: Secondary | ICD-10-CM

## 2017-03-03 DIAGNOSIS — M79672 Pain in left foot: Secondary | ICD-10-CM

## 2017-03-03 DIAGNOSIS — Z1159 Encounter for screening for other viral diseases: Secondary | ICD-10-CM | POA: Diagnosis not present

## 2017-03-03 DIAGNOSIS — Z8639 Personal history of other endocrine, nutritional and metabolic disease: Secondary | ICD-10-CM

## 2017-03-03 DIAGNOSIS — Z8669 Personal history of other diseases of the nervous system and sense organs: Secondary | ICD-10-CM

## 2017-03-03 DIAGNOSIS — M25561 Pain in right knee: Secondary | ICD-10-CM

## 2017-03-03 DIAGNOSIS — Z111 Encounter for screening for respiratory tuberculosis: Secondary | ICD-10-CM

## 2017-03-03 DIAGNOSIS — Z8679 Personal history of other diseases of the circulatory system: Secondary | ICD-10-CM

## 2017-03-03 DIAGNOSIS — H209 Unspecified iridocyclitis: Secondary | ICD-10-CM

## 2017-03-03 DIAGNOSIS — R5383 Other fatigue: Secondary | ICD-10-CM

## 2017-03-03 DIAGNOSIS — Z87898 Personal history of other specified conditions: Secondary | ICD-10-CM

## 2017-03-07 LAB — COMPLETE METABOLIC PANEL WITH GFR
AG RATIO: 0.9 (calc) — AB (ref 1.0–2.5)
ALT: 9 U/L (ref 9–46)
AST: 13 U/L (ref 10–35)
Albumin: 3.4 g/dL — ABNORMAL LOW (ref 3.6–5.1)
Alkaline phosphatase (APISO): 58 U/L (ref 40–115)
BUN: 13 mg/dL (ref 7–25)
CALCIUM: 8.9 mg/dL (ref 8.6–10.3)
CHLORIDE: 99 mmol/L (ref 98–110)
CO2: 28 mmol/L (ref 20–32)
Creat: 1.07 mg/dL (ref 0.70–1.33)
GFR, EST NON AFRICAN AMERICAN: 79 mL/min/{1.73_m2} (ref >=60)
GFR, Est African American: 91 mL/min/{1.73_m2} (ref >=60)
Globulin: 4 g/dL (calc) — ABNORMAL HIGH (ref 1.9–3.7)
Glucose, Bld: 100 mg/dL — ABNORMAL HIGH (ref 65–99)
POTASSIUM: 4.4 mmol/L (ref 3.5–5.3)
Sodium: 137 mmol/L (ref 135–146)
TOTAL PROTEIN: 7.4 g/dL (ref 6.1–8.1)
Total Bilirubin: 0.4 mg/dL (ref 0.2–1.2)

## 2017-03-07 LAB — HEPATITIS C ANTIBODY
Hepatitis C Ab: NONREACTIVE
SIGNAL TO CUT-OFF: 0.07 (ref <1.00)

## 2017-03-07 LAB — IGG, IGA, IGM
IGM, SERUM: 20 mg/dL — AB (ref 48–271)
IgG (Immunoglobin G), Serum: 2388 mg/dL — ABNORMAL HIGH (ref 694–1618)
Immunoglobulin A: 487 mg/dL — ABNORMAL HIGH (ref 81–463)

## 2017-03-07 LAB — QUANTIFERON TB GOLD ASSAY (BLOOD)
QUANTIFERON TB AG MINUS NIL: 0 [IU]/mL
QUANTIFERON(R)-TB GOLD: NEGATIVE
Quantiferon Nil Value: 0.04 IU/mL

## 2017-03-07 LAB — PROTEIN ELECTROPHORESIS, SERUM, WITH REFLEX
Albumin ELP: 3.5 g/dL — ABNORMAL LOW (ref 3.8–4.8)
Alpha 1: 0.4 g/dL — ABNORMAL HIGH (ref 0.2–0.3)
Alpha 2: 0.5 g/dL (ref 0.5–0.9)
Beta 2: 0.6 g/dL — ABNORMAL HIGH (ref 0.2–0.5)
Beta Globulin: 0.5 g/dL (ref 0.4–0.6)
Gamma Globulin: 2.2 g/dL — ABNORMAL HIGH (ref 0.8–1.7)
TOTAL PROTEIN: 7.7 g/dL (ref 6.1–8.1)

## 2017-03-07 LAB — URINALYSIS, ROUTINE W REFLEX MICROSCOPIC
Bilirubin Urine: NEGATIVE
Glucose, UA: NEGATIVE
HGB URINE DIPSTICK: NEGATIVE
Ketones, ur: NEGATIVE
LEUKOCYTES UA: NEGATIVE
NITRITE: NEGATIVE
PROTEIN: NEGATIVE
Specific Gravity, Urine: 1.02 (ref 1.001–1.03)
pH: 7 (ref 5.0–8.0)

## 2017-03-07 LAB — SEDIMENTATION RATE: Sed Rate: 36 mm/h — ABNORMAL HIGH (ref 0–20)

## 2017-03-07 LAB — TSH: TSH: 1.6 m[IU]/L (ref 0.40–4.50)

## 2017-03-07 LAB — RHEUMATOID FACTOR

## 2017-03-07 LAB — ANGIOTENSIN CONVERTING ENZYME: Angiotensin-Converting Enzyme: 59 U/L (ref 9–67)

## 2017-03-07 LAB — HEPATITIS B SURFACE ANTIGEN: HEP B S AG: NONREACTIVE

## 2017-03-07 LAB — HEPATITIS B CORE ANTIBODY, IGM: HEP B C IGM: NONREACTIVE

## 2017-03-07 LAB — PAN-ANCA
ANCA Screen: NEGATIVE
Myeloperoxidase Abs: <1 AI
Serine Protease 3: <1 AI

## 2017-03-07 LAB — CYCLIC CITRUL PEPTIDE ANTIBODY, IGG

## 2017-03-07 LAB — CK: CK TOTAL: 152 U/L (ref 44–196)

## 2017-03-08 ENCOUNTER — Ambulatory Visit (INDEPENDENT_AMBULATORY_CARE_PROVIDER_SITE_OTHER): Payer: Medicare Other | Admitting: Pulmonary Disease

## 2017-03-08 ENCOUNTER — Encounter: Payer: Self-pay | Admitting: Pulmonary Disease

## 2017-03-08 VITALS — BP 128/80 | HR 85 | Ht 70.0 in | Wt >= 6400 oz

## 2017-03-08 DIAGNOSIS — R29818 Other symptoms and signs involving the nervous system: Secondary | ICD-10-CM

## 2017-03-08 DIAGNOSIS — Z6841 Body Mass Index (BMI) 40.0 and over, adult: Secondary | ICD-10-CM

## 2017-03-08 NOTE — Patient Instructions (Signed)
Will arrange for in lab sleep study Will call to arrange for follow up after sleep study reviewed  

## 2017-03-08 NOTE — Progress Notes (Signed)
   Subjective:    Patient ID: [name], male    DOB: December 23, 1965, 51 y.o.   MRN: [id number]  HPI    Review of Systems  Constitutional: Negative for fever and unexpected weight change.  HENT: Positive for dental problem, ear pain and trouble swallowing. Negative for congestion, nosebleeds, postnasal drip, rhinorrhea, sinus pressure, sneezing and sore throat.   Eyes: Positive for pain and redness. Negative for itching.  Respiratory: Negative for cough, chest tightness, shortness of breath and wheezing.   Cardiovascular: Positive for leg swelling. Negative for palpitations.  Gastrointestinal: Negative for nausea and vomiting.  Genitourinary: Negative for dysuria.  Musculoskeletal: Negative for joint swelling.  Skin: Positive for rash.  Allergic/Immunologic: Negative.  Negative for environmental allergies, food allergies and immunocompromised state.  Neurological: Positive for headaches.  Hematological: Does not bruise/bleed easily.  Psychiatric/Behavioral: Negative for dysphoric mood. The patient is not nervous/anxious.        Objective:   Physical Exam        Assessment & Plan:

## 2017-03-08 NOTE — Progress Notes (Signed)
Past Surgical History He  has a past surgical history that includes Cataract extraction.  Allergies  Allergen Reactions  . Codeine     Family History His family history includes Diabetes in his brother; Esophageal cancer in his father; Heart disease in his mother.  Social History He  reports that he has never smoked. He has never used smokeless tobacco. He reports that he does not drink alcohol or use drugs.  Review of systems Constitutional: Negative for fever and unexpected weight change.  HENT: Positive for dental problem, ear pain and trouble swallowing. Negative for congestion, nosebleeds, postnasal drip, rhinorrhea, sinus pressure, sneezing and sore throat.   Eyes: Positive for pain and redness. Negative for itching.  Respiratory: Negative for cough, chest tightness, shortness of breath and wheezing.   Cardiovascular: Positive for leg swelling. Negative for palpitations.  Gastrointestinal: Negative for nausea and vomiting.  Genitourinary: Negative for dysuria.  Musculoskeletal: Negative for joint swelling.  Skin: Positive for rash.  Allergic/Immunologic: Negative.  Negative for environmental allergies, food allergies and immunocompromised state.  Neurological: Positive for headaches.  Hematological: Does not bruise/bleed easily.  Psychiatric/Behavioral: Negative for dysphoric mood. The patient is not nervous/anxious.     Current Outpatient Prescriptions on File Prior to Visit  Medication Sig  . aspirin 81 MG tablet Take 81 mg by mouth daily. Reported on 10/07/2015  . furosemide (LASIX) 40 MG tablet Take 1 tablet (40 mg total) by mouth daily.  . Meclizine HCl 25 MG CHEW Chew 1 tablet (25 mg total) by mouth 4 (four) times daily as needed.  . metoprolol succinate (TOPROL-XL) 50 MG 24 hr tablet Take 1 tablet (50 mg total) by mouth daily. Take with or immediately following a meal.  . pantoprazole (PROTONIX) 40 MG tablet Take 1 tablet (40 mg total) by mouth daily.  . traMADol  (ULTRAM) 50 MG tablet Take 1 tablet (50 mg total) by mouth every 6 (six) hours as needed.  . simvastatin (ZOCOR) 20 MG tablet Take 1 tablet (20 mg total) by mouth at bedtime. (Patient not taking: Reported on 03/08/2017)  . [DISCONTINUED] lisinopril (PRINIVIL,ZESTRIL) 20 MG tablet Take 1 tablet (20 mg total) by mouth daily.   No current facility-administered medications on file prior to visit.     Chief Complaint  Patient presents with  . Sleep Consult    Pt referred by Dr. Eleonore Chiquito MD; OSA and snores loudly at night. Pt is up 4-5 times each night to use the restroom, with some sleepiness during the day. Pt requests flu shot today    Cardiac tests Echo 03/30/12 >> severe LVH, EF 60%, grade 1 DD  Past medical history He  has a past medical history of Asthma; CAD (coronary artery disease); Depression; DJD (degenerative joint disease); GI bleed; Glucose intolerance (impaired glucose tolerance); Headache(784.0); Helicobacter pylori gastritis; HTN (hypertension); Low back pain; Morbid obesity ([facility]); Sarcoidosis; and Uveitis.  Vital signs BP 128/80 (BP Location: Left Arm, Cuff Size: Normal)   Pulse 85   Ht  (1.778 m)   Wt (!) 462 lb 6.4 oz (209.7 kg)   SpO2 97%   BMI 66.35 kg/m   History of Present Illness [name] is a 51 y.o. male for evaluation of sleep problems.  He has noticed trouble with his sleep for years.  He as trouble falling asleep and staying asleep.  He has been told that he snores.  He feels like he can't breath if he sleeps on his back.    He goes  to sleep at 11 pm.  He falls asleep after 5 minutes.  He wakes up 4 to 5  times to use the bathroom.  He gets out of bed at 6 am.  He feels tired in the morning.  He denies morning headache.  He does not use anything to help him fall sleep or stay awake.  He denies sleep walking, sleep talking, bruxism, or nightmares.  There is no history of restless legs.  He denies sleep hallucinations, sleep  paralysis, or cataplexy.  The Epworth score is 5 out of 24.   Physical Exam:  General - No distress Eyes - pupils reactive ENT - No sinus tenderness, no oral exudate, no LAN, no thyromegaly, TM clear, pupils equal/reactive, MP 4, poor dentition Cardiac - s1s2 regular, no murmur, pulses symmetric Chest - No wheeze/rales/dullness, good air entry, normal respiratory excursion Back - No focal tenderness Abd - Soft, non-tender, no organomegaly, + bowel sounds Ext - 2+ non pitting edema Neuro - Normal strength, cranial nerves intact Skin - chronic venous stasis changes Psych - Normal mood, and behavior   CMP Latest Ref Rng & Units 03/03/2017 03/03/2017 01/20/2016  Glucose 65 - 99 mg/dL - 161(W) 960(A)  BUN 7 - 25 mg/dL - 13 15  Creatinine 5.40 - 1.33 mg/dL - 9.81 1.91  Sodium 478 - 146 mmol/L - 137 137  Potassium 3.5 - 5.3 mmol/L - 4.4 4.8  Chloride 98 - 110 mmol/L - 99 99  CO2 20 - 32 mmol/L - 28 33(H)  Calcium 8.6 - 10.3 mg/dL - 8.9 8.8  Total Protein 6.1 - 8.1 g/dL 7.7 7.4 7.9  Total Bilirubin 0.2 - 1.2 mg/dL - 0.4 0.5  Alkaline Phos 39 - 117 U/L - - 64  AST 10 - 35 U/L - 13 11  ALT 9 - 46 U/L - 9 10    CBC Latest Ref Rng & Units 01/20/2016 07/17/2014 07/08/2013  WBC 4.0 - 10.5 K/uL 9.7 7.9 7.7  Hemoglobin 13.0 - 17.0 g/dL 29.5 62.1 30.8  Hematocrit 39.0 - 52.0 % 44.4 43.0 41.2  Platelets 150.0 - 400.0 K/uL 181.0 172.0 162.0     Discussion: He has snoring, sleep disruption, daytime sleepiness and apnea.  His BMI is > 35.  He has history of CAD, depression, HTN, and sarcoidosis.  He has family history of sleep apnea.  I am concerned he could also have sleep apnea.  We discussed how sleep apnea can affect various health problems, including risks for hypertension, cardiovascular disease, and diabetes.  We also discussed how sleep disruption can increase risks for accidents, such as while driving.  Weight loss as a means of improving sleep apnea was also reviewed.  Additional  treatment options discussed were CPAP therapy, oral appliance, and surgical intervention.   Assessment/plan:  Suspected sleep apnea. - will arrange for in lab sleep study due to concern for hypoventilation  Morbid obesity. - discussed options to assist with weight loss   Patient Instructions  Will arrange for in lab sleep study Will call to arrange for follow up after sleep study reviewed     Coralyn Helling, M.D. Pager [phone] 03/08/2017, 10:51 AM

## 2017-03-24 NOTE — Progress Notes (Signed)
Office Visit Note  Patient: [name]             Date of Birth: 08-16-1965           MRN: [id number]             PCP: Marletta Lor, MD Referring: Marletta Lor, MD Visit Date: 03/30/2017 Occupation: @GUAROCC @    Subjective:  Follow-up (New patient follow up. Continued bilateral knee pain with most pain on right knee.)   History of Present Illness: [name] is a 51 y.o. male with history of recurrent iritis. According to patient his last episode of iritis was in the beginning of October. He is still using steroid eyedrops. He continues to have pain and discomfort in his bilateral knee joints. There is no history of joint swelling.  Activities of Daily Living:  Patient reports morning stiffness for 2 hours.   Patient Reports nocturnal pain.  Difficulty dressing/grooming: Denies Difficulty climbing stairs: Reports Difficulty getting out of chair: Reports Difficulty using hands for taps, buttons, cutlery, and/or writing: Denies   Review of Systems  Constitutional: Negative for fatigue, night sweats and weakness ( ).  HENT: Negative for mouth sores, mouth dryness and nose dryness.   Eyes: Positive for redness and dryness.  Respiratory: Negative for shortness of breath and difficulty breathing.   Cardiovascular: Negative for chest pain, palpitations, hypertension, irregular heartbeat and swelling in legs/feet.  Gastrointestinal: Negative for constipation and diarrhea.  Endocrine: Negative for increased urination.  Musculoskeletal: Positive for arthralgias, joint pain and morning stiffness. Negative for joint swelling, myalgias, muscle weakness, muscle tenderness and myalgias.  Skin: Negative for color change, rash, hair loss, nodules/bumps, skin tightness, ulcers and sensitivity to sunlight.  Allergic/Immunologic: Negative for susceptible to infections.  Neurological: Positive for headaches. Negative for dizziness, fainting, memory loss and night  sweats.       History of migraines  Hematological: Negative for swollen glands.  Psychiatric/Behavioral: Negative for depressed mood and sleep disturbance. The patient is not nervous/anxious.     PMFS History:  Patient Active Problem List   Diagnosis Date Noted  . Iritis 03/30/2017  . Primary osteoarthritis of both knees 03/30/2017  . Osteoarthritis of right knee 12/04/2014  . Left ventricular hypertrophy 06/04/2012  . History of migraine headaches 06/04/2012  . Coronary atherosclerosis 06/16/2010  . VENTRICULAR TACHYCARDIA, PAROXYSMAL 06/11/2010  . ACUTE PROSTATITIS 12/03/2009  . ABDOMINAL PAIN, GENERALIZED 12/03/2009  . HELICOBACTER PYLORI [place] INFECTION 09/07/2009  . ANXIETY DEPRESSION 12/18/2008  . HEADACHE 08/04/2008  . ABDOMINAL PAIN 11/20/2007  . LEG PAIN, BILATERAL 09/13/2007  . ASTHMA 06/14/2007  . LOW BACK PAIN 06/14/2007  . Glucose intolerance (impaired glucose tolerance) 06/14/2007  . Dyslipidemia 04/10/2007  . MORBID OBESITY 04/10/2007  . SARCOIDOSIS 10/05/2006  . Essential hypertension 10/05/2006    Past Medical History:  Diagnosis Date  . Asthma   . CAD (coronary artery disease)    nonobstructive -- with LVH but preserved EF  . Depression   . DJD (degenerative joint disease)   . GI bleed   . Glucose intolerance (impaired glucose tolerance)   . Headache(784.0)   . Helicobacter pylori gastritis   . HTN (hypertension)   . Low back pain   . Morbid obesity ([place])   . Sarcoidosis   . Uveitis     Family History  Problem Relation Age of Onset  . Heart disease Mother   . Diabetes Brother        x2  .  Esophageal cancer Father   . Colon cancer Neg Hx   . Colon polyps Neg Hx   . Kidney disease Neg Hx    Past Surgical History:  Procedure Laterality Date  . CATARACT EXTRACTION     Social History   Social History Narrative  . No narrative on file     Objective: Vital Signs: BP (!) 195/103 (BP Location: Left Arm, Patient Position: Sitting,  Cuff Size: Large)   Pulse 85   Ht 5' 9"  (1.753 m)   Wt (!) 450 lb (204.1 kg)   BMI 66.45 kg/m    Physical Exam  Constitutional: He is oriented to person, place, and time. He appears well-developed and well-nourished.  HENT:  Head: Normocephalic and atraumatic.  Eyes: Pupils are equal, round, and reactive to light. Conjunctivae and EOM are normal.  Neck: Normal range of motion. Neck supple.  Cardiovascular: Normal rate, regular rhythm and normal heart sounds.   Pulmonary/Chest: Effort normal and breath sounds normal.  Abdominal: Soft. Bowel sounds are normal.  Neurological: He is alert and oriented to person, place, and time.  Skin: Skin is warm and dry. Capillary refill takes less than 2 seconds.  Psychiatric: He has a normal mood and affect. His behavior is normal.  Nursing note and vitals reviewed.    Musculoskeletal Exam: C-spine and thoracic lumbar spine good range of motion. Shoulder joints elbow joints wrist joints are good range of motion with no synovitis. Hip joints knee joints ankles MTPs PIPs with good range of motion with no synovitis. He had discomfort range of motion of his bilateral knee joints due to underlying osteoarthritis.  CDAI Exam: No CDAI exam completed.    Investigation: No additional findings. 03/03/2017 CK 152, CMP normal ,TSH normal UA negative, SPEP negative, TB gold negative, Ig G and IgA elevated IgM low, hep B and C negative ACE 59, RF negative, anti-CCP negative, ESR 36 elevated, ANCA negative Lab results reviewed with patient at length. Imaging: Xr Foot 2 Views Left  Result Date: 03/03/2017 No MTP PIP/DIP narrowing was noted. No intertarsal joint space narrowing was noted. No erosive changes were noted. Impression: Normal x-ray of the foot.  Xr Foot 2 Views Right  Result Date: 03/03/2017 No MTP PIP/DIP narrowing was noted. No intertarsal joint space narrowing was noted. No erosive changes were noted. Impression: Normal x-ray of the  foot.  Xr Knee 3 View Left  Result Date: 03/03/2017 Severe medial compartment narrowing with lateral osteophytes intercondylar osteophytes no chondrocalcinosis was noted. Severe patellofemoral narrowing was noted. Impression: Severe osteoarthritis of knee joint and severe chondromalacia patella.  Xr Knee 3 View Right  Result Date: 03/03/2017 Severe end-stage osteoarthritis with severe medial compartment narrowing. Lateral osteophytes were noted. No chondrocalcinosis was noted. Moderate patellofemoral narrowing was noted. Impression: severe osteoarthritis of the knee with moderate chondromalacia patella   Speciality Comments: No specialty comments available.    Procedures:  No procedures performed Allergies: Codeine   Assessment / Plan:     Visit Diagnoses: Iritis: Patient has history of recurrent iritis. He is currently doing better. All his autoimmune workup has been negative. Patient was cautioned living if he has sarcoidosis. His chest x-ray report was normal in the past. He is a is level was normal. I do not see any evidence of sarcoidosis on examination. I would be happy to assist if Dr. Baird Cancer feels that he needs methotrexate or other immunosuppressive medications. I cannot establish a diagnosis of autoimmune disease at this point.  Primary osteoarthritis of  both knees - Bilateral severe with severe chondromalacia patella . He has been seeing orthopedic surgeon and will require total knee replacement after weight loss. Weight loss diet and exercise was discussed.  Class III severe obesity with BMI 66. Weight loss diet and exercise was discussed at length.  His other medical problems are listed as follows.  History of diabetes mellitus  History of hypertension: His blood pressure is elevated today. I've advised him to monitor his blood pressure closely and follow up with his PCP.  History of hyperlipidemia  History of coronary artery disease  History of migraine  headaches  History of angioedema    Orders: No orders of the defined types were placed in this encounter.  No orders of the defined types were placed in this encounter.   Face-to-face time spent with patient was 30 minutes. Greater than 50% of time was spent in counseling and coordination of care.  Follow-Up Instructions: Return if symptoms worsen or fail to improve, for Iritis, osteoarthritis.   Bo Merino, MD  Note - This record has been created using Editor, commissioning.  Chart creation errors have been sought, but may not always  have been located. Such creation errors do not reflect on  the standard of medical care.

## 2017-03-29 ENCOUNTER — Telehealth: Payer: Self-pay | Admitting: Rheumatology

## 2017-03-29 DIAGNOSIS — H44113 Panuveitis, bilateral: Secondary | ICD-10-CM | POA: Diagnosis not present

## 2017-03-29 DIAGNOSIS — H35372 Puckering of macula, left eye: Secondary | ICD-10-CM | POA: Diagnosis not present

## 2017-03-29 DIAGNOSIS — H3581 Retinal edema: Secondary | ICD-10-CM | POA: Diagnosis not present

## 2017-03-29 DIAGNOSIS — H43811 Vitreous degeneration, right eye: Secondary | ICD-10-CM | POA: Diagnosis not present

## 2017-03-29 NOTE — Telephone Encounter (Signed)
Amy from Dr. Stephannie LiJason [facility] office called requesting the office notes and labs for this patient.

## 2017-03-29 NOTE — Telephone Encounter (Signed)
Patient will need to sign a release of records and then the records may be faxed to them.

## 2017-03-29 NOTE — Telephone Encounter (Signed)
Labs and office note faxed to referring office.

## 2017-03-29 NOTE — Telephone Encounter (Signed)
I spoke to Amy at [facility] regarding this referral.  There is a paper referral in the patient's chart on April 25th.

## 2017-03-30 ENCOUNTER — Ambulatory Visit (INDEPENDENT_AMBULATORY_CARE_PROVIDER_SITE_OTHER): Payer: Medicare Other | Admitting: Rheumatology

## 2017-03-30 ENCOUNTER — Encounter: Payer: Self-pay | Admitting: Rheumatology

## 2017-03-30 ENCOUNTER — Telehealth (INDEPENDENT_AMBULATORY_CARE_PROVIDER_SITE_OTHER): Payer: Self-pay | Admitting: Rheumatology

## 2017-03-30 VITALS — BP 195/103 | HR 85 | Ht 69.0 in | Wt >= 6400 oz

## 2017-03-30 DIAGNOSIS — H209 Unspecified iridocyclitis: Secondary | ICD-10-CM | POA: Diagnosis not present

## 2017-03-30 DIAGNOSIS — Z6841 Body Mass Index (BMI) 40.0 and over, adult: Secondary | ICD-10-CM | POA: Diagnosis not present

## 2017-03-30 DIAGNOSIS — M170 Bilateral primary osteoarthritis of knee: Secondary | ICD-10-CM | POA: Diagnosis not present

## 2017-03-30 NOTE — Telephone Encounter (Signed)
03/03/2017 OV note faxed to Dr Allyne GeeSanders office [phone]/attn: Amy

## 2017-04-05 ENCOUNTER — Telehealth: Payer: Self-pay | Admitting: Internal Medicine

## 2017-04-05 NOTE — Telephone Encounter (Signed)
Pt had refills already. Nothing needed

## 2017-04-26 ENCOUNTER — Ambulatory Visit (HOSPITAL_BASED_OUTPATIENT_CLINIC_OR_DEPARTMENT_OTHER): Payer: Medicare Other | Attending: Pulmonary Disease

## 2017-05-08 ENCOUNTER — Ambulatory Visit: Payer: Medicare Other | Admitting: Internal Medicine

## 2017-05-16 ENCOUNTER — Telehealth: Payer: Self-pay | Admitting: Internal Medicine

## 2017-05-16 ENCOUNTER — Other Ambulatory Visit: Payer: Self-pay | Admitting: *Deleted

## 2017-05-16 MED ORDER — FUROSEMIDE 40 MG PO TABS: 40.0000 mg | ORAL_TABLET | Freq: Every day | ORAL | 2 refills | Status: DC

## 2017-05-16 MED ORDER — METOPROLOL SUCCINATE ER 50 MG PO TB24: 50.0000 mg | ORAL_TABLET | Freq: Every day | ORAL | 3 refills | Status: DC

## 2017-05-16 NOTE — Telephone Encounter (Signed)
Copied from CRM [phone]. Topic: Quick Communication - See Telephone Encounter >> May 16, 2017 12:11 PM Waymon AmatoBurton, Donna F wrote: CRM for notification. See Telephone encounter for:  Pt is needing a refill on his lasix and metoprolol sent to the [facility] pharmacy on [address] he is no longer with [facility]  05/16/17.

## 2017-06-05 ENCOUNTER — Ambulatory Visit (INDEPENDENT_AMBULATORY_CARE_PROVIDER_SITE_OTHER): Payer: Medicare Other | Admitting: Internal Medicine

## 2017-06-05 ENCOUNTER — Encounter: Payer: Self-pay | Admitting: Internal Medicine

## 2017-06-05 VITALS — BP 152/90 | HR 93 | Temp 98.1°F | Ht 69.0 in | Wt >= 6400 oz

## 2017-06-05 DIAGNOSIS — Z0000 Encounter for general adult medical examination without abnormal findings: Secondary | ICD-10-CM

## 2017-06-05 DIAGNOSIS — G4733 Obstructive sleep apnea (adult) (pediatric): Secondary | ICD-10-CM | POA: Diagnosis not present

## 2017-06-05 DIAGNOSIS — H209 Unspecified iridocyclitis: Secondary | ICD-10-CM | POA: Diagnosis not present

## 2017-06-05 DIAGNOSIS — E785 Hyperlipidemia, unspecified: Secondary | ICD-10-CM | POA: Diagnosis not present

## 2017-06-05 DIAGNOSIS — I2510 Atherosclerotic heart disease of native coronary artery without angina pectoris: Secondary | ICD-10-CM | POA: Diagnosis not present

## 2017-06-05 DIAGNOSIS — I10 Essential (primary) hypertension: Secondary | ICD-10-CM

## 2017-06-05 DIAGNOSIS — R7302 Impaired glucose tolerance (oral): Secondary | ICD-10-CM

## 2017-06-05 LAB — CBC WITH DIFFERENTIAL/PLATELET
BASOS PCT: 0.6 % (ref 0.0–3.0)
Basophils Absolute: 0 10*3/uL (ref 0.0–0.1)
EOS PCT: 2.2 % (ref 0.0–5.0)
Eosinophils Absolute: 0.2 10*3/uL (ref 0.0–0.7)
HEMATOCRIT: 41.6 % (ref 39.0–52.0)
HEMOGLOBIN: 13.3 g/dL (ref 13.0–17.0)
LYMPHS PCT: 29.3 % (ref 12.0–46.0)
Lymphs Abs: 2.3 10*3/uL (ref 0.7–4.0)
MCHC: 31.9 g/dL (ref 30.0–36.0)
MCV: 95 fl (ref 78.0–100.0)
MONO ABS: 0.8 10*3/uL (ref 0.1–1.0)
Monocytes Relative: 10.5 % (ref 3.0–12.0)
Neutro Abs: 4.5 10*3/uL (ref 1.4–7.7)
Neutrophils Relative %: 57.4 % (ref 43.0–77.0)
Platelets: 188 10*3/uL (ref 150.0–400.0)
RBC: 4.38 Mil/uL (ref 4.22–5.81)
RDW: 12.6 % (ref 11.5–15.5)
WBC: 7.9 10*3/uL (ref 4.0–10.5)

## 2017-06-05 LAB — COMPREHENSIVE METABOLIC PANEL
ALBUMIN: 3.6 g/dL (ref 3.5–5.2)
ALK PHOS: 63 U/L (ref 39–117)
ALT: 7 U/L (ref 0–53)
AST: 10 U/L (ref 0–37)
BUN: 14 mg/dL (ref 6–23)
CALCIUM: 9 mg/dL (ref 8.4–10.5)
CHLORIDE: 97 meq/L (ref 96–112)
CO2: 30 mEq/L (ref 19–32)
Creatinine, Ser: 0.95 mg/dL (ref 0.40–1.50)
GFR: 106.25 mL/min (ref >60.00)
Glucose, Bld: 94 mg/dL (ref 70–99)
POTASSIUM: 4.7 meq/L (ref 3.5–5.1)
SODIUM: 136 meq/L (ref 135–145)
TOTAL PROTEIN: 7.5 g/dL (ref 6.0–8.3)
Total Bilirubin: 0.4 mg/dL (ref 0.2–1.2)

## 2017-06-05 LAB — TSH: TSH: 2.6 u[IU]/mL (ref 0.35–4.50)

## 2017-06-05 NOTE — Progress Notes (Signed)
Subjective:    Patient ID: [name], male    DOB: [birth year], 52 y.o.   MRN: [id number]  HPI  52 year old patient is seen today for a annual health examination and subsequent Medicare wellness visit He has been seen by rheumatology recently after referral by ophthalmology due to recurrent iritis involving the left eye.  He had a negative rheumatologic evaluation. Does have a history of chronic stable sarcoidosis.  He has essential hypertension and coronary artery disease.  He has been off all his medications. He has morbid obesity and OSA suspect.  He was scheduled for sleep study 1 year ago but this was not performed. He states he sleeps quite well in spite of nocturia.  Denies any daytime sleepiness.  Past Medical History:  Diagnosis Date  . Asthma   . CAD (coronary artery disease)    nonobstructive -- with LVH but preserved EF  . Depression   . DJD (degenerative joint disease)   . GI bleed   . Glucose intolerance (impaired glucose tolerance)   . Headache(784.0)   . Helicobacter pylori gastritis   . HTN (hypertension)   . Low back pain   . Morbid obesity ([facility])   . Sarcoidosis   . Uveitis      Social History   Socioeconomic History  . Marital status: Divorced    Spouse name: Not on file  . Number of children: 2  . Years of education: Not on file  . Highest education level: Not on file  Social Needs  . Financial resource strain: Not on file  . Food insecurity - worry: Not on file  . Food insecurity - inability: Not on file  . Transportation needs - medical: Not on file  . Transportation needs - non-medical: Not on file  Occupational History  . Occupation: DISABILITY    Employer: UNEMPLOYED  Tobacco Use  . Smoking status: Never Smoker  . Smokeless tobacco: Never Used  Substance and Sexual Activity  . Alcohol use: No  . Drug use: No  . Sexual activity: Not on file  Other Topics Concern  . Not on file  Social History Narrative  . Not on file    Past  Surgical History:  Procedure Laterality Date  . CATARACT EXTRACTION      Family History  Problem Relation Age of Onset  . Heart disease Mother   . Diabetes Brother        x2  . Esophageal cancer Father   . Colon cancer Neg Hx   . Colon polyps Neg Hx   . Kidney disease Neg Hx     Allergies  Allergen Reactions  . Codeine     Current Outpatient Medications on File Prior to Visit  Medication Sig Dispense Refill  . simvastatin (ZOCOR) 20 MG tablet Take 1 tablet (20 mg total) by mouth at bedtime. 90 tablet 1  . aspirin 81 MG tablet Take 81 mg by mouth daily. Reported on 10/07/2015    . furosemide (LASIX) 40 MG tablet Take 1 tablet (40 mg total) by mouth daily. (Patient not taking: Reported on 06/05/2017) 90 tablet 2  . Meclizine HCl 25 MG CHEW Chew 1 tablet (25 mg total) by mouth 4 (four) times daily as needed. (Patient not taking: Reported on 06/05/2017) 90 each 2  . metoprolol succinate (TOPROL-XL) 50 MG 24 hr tablet Take 1 tablet (50 mg total) by mouth daily. Take with or immediately following a meal. (Patient not taking: Reported on 06/05/2017) 90 tablet 3  .  pantoprazole (PROTONIX) 40 MG tablet Take 1 tablet (40 mg total) by mouth daily. (Patient not taking: Reported on 06/05/2017) 90 tablet 3  . traMADol (ULTRAM) 50 MG tablet Take 1 tablet (50 mg total) by mouth every 6 (six) hours as needed. (Patient not taking: Reported on 06/05/2017) 60 tablet 0  . [DISCONTINUED] lisinopril (PRINIVIL,ZESTRIL) 20 MG tablet Take 1 tablet (20 mg total) by mouth daily. 90 tablet 1   No current facility-administered medications on file prior to visit.     BP (!) 152/90 (BP Location: Left Arm, Patient Position: Sitting, Cuff Size: Normal)   Pulse 93   Temp 98.1 F (36.7 C) (Oral)   Ht 5\' 9"  (1.753 m)   Wt (!) 470 lb (213.2 kg)   SpO2 91%   BMI 69.41 kg/m   Subsequent Medicare wellness visit  1. Risk factors, based on past  M,S,F history.  Patient has known coronary artery disease.  Cardio vascular  risk factors include hypertension and dyslipidemia.  2.  Physical activities: Sedentary due to arthritis and morbid obesity  3.  Depression/mood: No history of major depression  4.  Hearing: No deficits  5.  ADL's: Independent 6.  Fall risk: Moderate due to morbid obesity  7.  Home safety: No problems identified  8.  Height weight, and visual acuity; height and weight stable has been followed closely by ophthalmology due to recurrent iritis involving the left eye  9.  Counseling: Weight loss encouraged.  Outpatient sleep study recommended  10. Lab orders based on risk factors: Laboratory update will be reviewed  11. Referral : Pulmonary medicine for OSA evaluation  12. Care plan: Continue efforts at aggressive risk factor modification  13. Cognitive assessment: Alert and oriented with normal affect.  No cognitive dysfunction  14. Screening: Patient provided with a written and personalized 5-10 year screening schedule in the AVS.    15. Provider List Update: Rheumatology cardiology pulmonary medicine and primary care as well as ophthalmology    Review of Systems  Constitutional: Positive for fatigue. Negative for appetite change, chills and fever.  HENT: Negative for congestion, dental problem, ear pain, hearing loss, sore throat, tinnitus, trouble swallowing and voice change.   Eyes: Positive for redness. Negative for pain, discharge and visual disturbance.  Respiratory: Negative for cough, chest tightness, wheezing and stridor.   Cardiovascular: Negative for chest pain, palpitations and leg swelling.  Gastrointestinal: Negative for abdominal distention, abdominal pain, blood in stool, constipation, diarrhea, nausea and vomiting.  Genitourinary: Positive for frequency. Negative for difficulty urinating, discharge, flank pain, genital sores, hematuria and urgency.  Musculoskeletal: Negative for arthralgias, back pain, gait problem, joint swelling, myalgias and neck stiffness.    Skin: Negative for rash.  Neurological: Negative for dizziness, syncope, speech difficulty, weakness, numbness and headaches.  Hematological: Negative for adenopathy. Does not bruise/bleed easily.  Psychiatric/Behavioral: Negative for behavioral problems and dysphoric mood. The patient is not nervous/anxious.        Objective:   Physical Exam  Constitutional: He appears well-developed and well-nourished. No distress.  Weight 470 Blood pressure 140/90  HENT:  Head: Normocephalic and atraumatic.  Right Ear: External ear normal.  Left Ear: External ear normal.  Nose: Nose normal.  Mouth/Throat: Oropharynx is clear and moist.  Low hanging soft palate  Eyes: Conjunctivae and EOM are normal. Pupils are equal, round, and reactive to light. No scleral icterus.  Neck: Normal range of motion. Neck supple. No JVD present. No thyromegaly present.  Cardiovascular: Regular rhythm and normal  heart sounds. Exam reveals no gallop and no friction rub.  No murmur heard. Pedal pulses very faint  Pulmonary/Chest: Effort normal and breath sounds normal. He exhibits no tenderness.  Abdominal: Soft. Bowel sounds are normal. He exhibits no distension and no mass. There is no tenderness.  Genitourinary: Prostate normal and penis normal. Rectal exam shows guaiac negative stool.  Musculoskeletal: Normal range of motion. He exhibits edema. He exhibits no tenderness.  Lymphadenopathy:    He has no cervical adenopathy.  Neurological: He is alert. He has normal reflexes. No cranial nerve deficit. Coordination normal.  Skin: Skin is warm and dry. No rash noted.  Stasis changes distal lower extremity  Psychiatric: He has a normal mood and affect. His behavior is normal.          Assessment & Plan:   Subsequent Medicare wellness visit Essential hypertension Coronary artery disease.  Resume aspirin and statin therapy Morbid obesity.  Will check a sleep study History sarcoidosis History recurrent iritis  with negative rheumatologic evaluation  Cardiology follow-up as scheduled Schedule sleep study Review lab Weight loss encouraged All medications refilled Follow-up 6 months  [name] Homero Fellers

## 2017-06-05 NOTE — Patient Instructions (Signed)
Limit your sodium (Salt) intake    It is important that you exercise regularly, at least 20 minutes 3 to 4 times per week.  If you develop chest pain or shortness of breath seek  medical attention.  You need to lose weight.  Consider a lower calorie diet and regular exercise.  Outpatient sleep study as discussed   Please check your blood pressure on a regular basis.  If it is consistently greater than 150/90, please make an office appointment.    Return in 6 months for follow-up

## 2017-10-04 ENCOUNTER — Ambulatory Visit: Payer: Medicare Other | Admitting: Internal Medicine

## 2017-10-04 DIAGNOSIS — Z0289 Encounter for other administrative examinations: Secondary | ICD-10-CM

## 2017-12-04 ENCOUNTER — Encounter: Payer: Self-pay | Admitting: Internal Medicine

## 2017-12-04 ENCOUNTER — Ambulatory Visit (INDEPENDENT_AMBULATORY_CARE_PROVIDER_SITE_OTHER): Payer: Medicare Other | Admitting: Internal Medicine

## 2017-12-04 VITALS — BP 150/86 | HR 82 | Temp 98.4°F | Wt >= 6400 oz

## 2017-12-04 DIAGNOSIS — I2510 Atherosclerotic heart disease of native coronary artery without angina pectoris: Secondary | ICD-10-CM | POA: Diagnosis not present

## 2017-12-04 DIAGNOSIS — G4733 Obstructive sleep apnea (adult) (pediatric): Secondary | ICD-10-CM

## 2017-12-04 MED ORDER — FUROSEMIDE 40 MG PO TABS: 40.0000 mg | ORAL_TABLET | Freq: Every day | ORAL | 4 refills | Status: AC

## 2017-12-04 MED ORDER — METOPROLOL SUCCINATE ER 50 MG PO TB24: 50.0000 mg | ORAL_TABLET | Freq: Every day | ORAL | 3 refills | Status: AC

## 2017-12-04 MED ORDER — SIMVASTATIN 20 MG PO TABS: 20.0000 mg | ORAL_TABLET | Freq: Every day | ORAL | 3 refills | Status: DC

## 2017-12-04 MED ORDER — TRAMADOL HCL 50 MG PO TABS: 50.0000 mg | ORAL_TABLET | Freq: Four times a day (QID) | ORAL | 0 refills | Status: AC | PRN

## 2017-12-04 MED ORDER — PANTOPRAZOLE SODIUM 40 MG PO TBEC: 40.0000 mg | DELAYED_RELEASE_TABLET | Freq: Every day | ORAL | 3 refills | Status: DC

## 2017-12-04 NOTE — Progress Notes (Signed)
Subjective:    Patient ID: [name], male    DOB: 09/25/1965, 52 y.o.   MRN: [id number]  HPI  52 year old patient who is seen today for his six-month follow-up.  He has a history of morbid obesity, essential hypertension nonobstructive coronary artery disease and dyslipidemia.  He has sarcoidosis.  He also has a history of arthritis involving the knees. His chief complaint today is left lumbar pain of 2 days duration.  He does describe some mild dysesthesias involving the left leg as well. Denies any cardiopulmonary complaints  Past Medical History:  Diagnosis Date  . Asthma   . CAD (coronary artery disease)    nonobstructive -- with LVH but preserved EF  . Depression   . DJD (degenerative joint disease)   . GI bleed   . Glucose intolerance (impaired glucose tolerance)   . Headache(784.0)   . Helicobacter pylori gastritis   . HTN (hypertension)   . Low back pain   . Morbid obesity ([facility])   . Sarcoidosis   . Uveitis      Social History   Socioeconomic History  . Marital status: Divorced    Spouse name: Not on file  . Number of children: 2  . Years of education: Not on file  . Highest education level: Not on file  Occupational History  . Occupation: DISABILITY    Employer: UNEMPLOYED  Social Needs  . Financial resource strain: Not on file  . Food insecurity:    Worry: Not on file    Inability: Not on file  . Transportation needs:    Medical: Not on file    Non-medical: Not on file  Tobacco Use  . Smoking status: Never Smoker  . Smokeless tobacco: Never Used  Substance and Sexual Activity  . Alcohol use: No  . Drug use: No  . Sexual activity: Not on file  Lifestyle  . Physical activity:    Days per week: Not on file    Minutes per session: Not on file  . Stress: Not on file  Relationships  . Social connections:    Talks on phone: Not on file    Gets together: Not on file    Attends religious service: Not on file    Active member of club or  organization: Not on file    Attends meetings of clubs or organizations: Not on file    Relationship status: Not on file  . Intimate partner violence:    Fear of current or ex partner: Not on file    Emotionally abused: Not on file    Physically abused: Not on file    Forced sexual activity: Not on file  Other Topics Concern  . Not on file  Social History Narrative  . Not on file    Past Surgical History:  Procedure Laterality Date  . CATARACT EXTRACTION      Family History  Problem Relation Age of Onset  . Heart disease Mother   . Diabetes Brother        x2  . Esophageal cancer Father   . Colon cancer Neg Hx   . Colon polyps Neg Hx   . Kidney disease Neg Hx     Allergies  Allergen Reactions  . Codeine     Current Outpatient Medications on File Prior to Visit  Medication Sig Dispense Refill  . aspirin 81 MG tablet Take 81 mg by mouth daily. Reported on 10/07/2015    . Meclizine HCl 25 MG CHEW  Chew 1 tablet (25 mg total) by mouth 4 (four) times daily as needed. 90 each 2  . [DISCONTINUED] lisinopril (PRINIVIL,ZESTRIL) 20 MG tablet Take 1 tablet (20 mg total) by mouth daily. 90 tablet 1   No current facility-administered medications on file prior to visit.     BP (!) 150/86 (BP Location: Left Wrist, Patient Position: Sitting, Cuff Size: Large)   Pulse 82   Temp 98.4 F (36.9 C) (Oral)   Wt (!) 470 lb (213.2 kg)   SpO2 96%   BMI 69.41 kg/m      Review of Systems  Constitutional: Positive for fatigue. Negative for appetite change, chills and fever.  HENT: Negative for congestion, dental problem, ear pain, hearing loss, sore throat, tinnitus, trouble swallowing and voice change.   Eyes: Negative for pain, discharge and visual disturbance.  Respiratory: Negative for cough, chest tightness, wheezing and stridor.   Cardiovascular: Negative for chest pain, palpitations and leg swelling.  Gastrointestinal: Negative for abdominal distention, abdominal pain, blood in  stool, constipation, diarrhea, nausea and vomiting.  Genitourinary: Negative for difficulty urinating, discharge, flank pain, genital sores, hematuria and urgency.  Musculoskeletal: Positive for arthralgias and back pain. Negative for gait problem, joint swelling, myalgias and neck stiffness.  Skin: Negative for rash.  Neurological: Negative for dizziness, syncope, speech difficulty, weakness, numbness and headaches.  Hematological: Negative for adenopathy. Does not bruise/bleed easily.  Psychiatric/Behavioral: Negative for behavioral problems and dysphoric mood. The patient is not nervous/anxious.        Objective:   Physical Exam  Constitutional: He is oriented to person, place, and time. He appears well-developed.  Weight 470 Blood pressure 124/80  HENT:  Head: Normocephalic.  Right Ear: External ear normal.  Left Ear: External ear normal.  Eyes: Conjunctivae and EOM are normal.  Neck: Normal range of motion.  Cardiovascular: Normal rate and normal heart sounds.  Pulmonary/Chest: Breath sounds normal.  Abdominal: Bowel sounds are normal.  Musculoskeletal: Normal range of motion. He exhibits edema. He exhibits no tenderness.  Neurological: He is alert and oriented to person, place, and time.  Skin:  Mild stasis changes of distal lower extremities  Psychiatric: He has a normal mood and affect. His behavior is normal.          Assessment & Plan:   Low back pain.  Left leg dysesthesias suggest possible mild radiculopathy.  Patient has been symptomatic for only 2 days and using acetaminophen only will treat with Aleve rest and observe Essential hypertension stable Coronary artery disease asymptomatic Dyslipidemia.  Statin therapy refilled History of asthma Morbid obesity.  Will reorder sleep study  [facility] [employer]

## 2017-12-04 NOTE — Patient Instructions (Addendum)
Limit your sodium (Salt) intake  Most patients with low back pain will improve with time over the next two to 6 weeks.  Keep active but avoid any activities that cause pain.  Apply moist heat to the low back area several times daily.   Take Aleve 200 mg twice daily for pain or inflammation  You need to lose weight.  Consider a lower calorie diet and regular exercise.  Return in 6 months for follow-up Pulmonary consultation/evaluation for possible obstructive sleep apnea as discussed

## 2018-01-15 ENCOUNTER — Ambulatory Visit: Payer: Medicare Other | Admitting: Pulmonary Disease

## 2018-01-26 DIAGNOSIS — H20023 Recurrent acute iridocyclitis, bilateral: Secondary | ICD-10-CM | POA: Diagnosis not present

## 2018-02-28 ENCOUNTER — Other Ambulatory Visit: Payer: Self-pay | Admitting: Internal Medicine

## 2018-06-21 DIAGNOSIS — Z125 Encounter for screening for malignant neoplasm of prostate: Secondary | ICD-10-CM | POA: Diagnosis not present

## 2018-06-21 DIAGNOSIS — E559 Vitamin D deficiency, unspecified: Secondary | ICD-10-CM | POA: Diagnosis not present

## 2018-06-21 DIAGNOSIS — Z7689 Persons encountering health services in other specified circumstances: Secondary | ICD-10-CM | POA: Diagnosis not present

## 2018-06-21 DIAGNOSIS — M129 Arthropathy, unspecified: Secondary | ICD-10-CM | POA: Diagnosis not present

## 2018-06-21 DIAGNOSIS — E7800 Pure hypercholesterolemia, unspecified: Secondary | ICD-10-CM | POA: Diagnosis not present

## 2018-06-21 DIAGNOSIS — I10 Essential (primary) hypertension: Secondary | ICD-10-CM | POA: Diagnosis not present

## 2018-07-05 DIAGNOSIS — M25562 Pain in left knee: Secondary | ICD-10-CM | POA: Diagnosis not present

## 2018-07-05 DIAGNOSIS — M549 Dorsalgia, unspecified: Secondary | ICD-10-CM | POA: Diagnosis not present

## 2018-07-05 DIAGNOSIS — I10 Essential (primary) hypertension: Secondary | ICD-10-CM | POA: Diagnosis not present

## 2018-07-05 DIAGNOSIS — M545 Low back pain: Secondary | ICD-10-CM | POA: Diagnosis not present

## 2018-07-05 DIAGNOSIS — M25561 Pain in right knee: Secondary | ICD-10-CM | POA: Diagnosis not present

## 2018-07-26 DIAGNOSIS — M545 Low back pain: Secondary | ICD-10-CM | POA: Diagnosis not present

## 2018-07-26 DIAGNOSIS — Z79899 Other long term (current) drug therapy: Secondary | ICD-10-CM | POA: Diagnosis not present

## 2018-07-26 DIAGNOSIS — M25562 Pain in left knee: Secondary | ICD-10-CM | POA: Diagnosis not present

## 2018-07-26 DIAGNOSIS — M25561 Pain in right knee: Secondary | ICD-10-CM | POA: Diagnosis not present

## 2018-07-26 DIAGNOSIS — I10 Essential (primary) hypertension: Secondary | ICD-10-CM | POA: Diagnosis not present

## 2018-08-09 DIAGNOSIS — R635 Abnormal weight gain: Secondary | ICD-10-CM | POA: Diagnosis not present

## 2018-08-09 DIAGNOSIS — Z6841 Body Mass Index (BMI) 40.0 and over, adult: Secondary | ICD-10-CM | POA: Diagnosis not present

## 2018-09-04 ENCOUNTER — Telehealth: Payer: Self-pay | Admitting: *Deleted

## 2018-09-04 NOTE — Telephone Encounter (Signed)
Left message on machine for patient to return our call.  Patient will need to schedule TOC for further refills. CRM

## 2018-09-05 DIAGNOSIS — R0602 Shortness of breath: Secondary | ICD-10-CM | POA: Diagnosis not present

## 2018-09-05 DIAGNOSIS — M545 Low back pain: Secondary | ICD-10-CM | POA: Diagnosis not present

## 2018-09-05 DIAGNOSIS — K219 Gastro-esophageal reflux disease without esophagitis: Secondary | ICD-10-CM | POA: Diagnosis not present

## 2018-09-05 DIAGNOSIS — Z6841 Body Mass Index (BMI) 40.0 and over, adult: Secondary | ICD-10-CM | POA: Diagnosis not present

## 2018-10-04 DIAGNOSIS — R5383 Other fatigue: Secondary | ICD-10-CM | POA: Diagnosis not present

## 2018-10-04 DIAGNOSIS — Z79899 Other long term (current) drug therapy: Secondary | ICD-10-CM | POA: Diagnosis not present

## 2018-10-04 DIAGNOSIS — F902 Attention-deficit hyperactivity disorder, combined type: Secondary | ICD-10-CM | POA: Diagnosis not present

## 2018-10-04 DIAGNOSIS — M25561 Pain in right knee: Secondary | ICD-10-CM | POA: Diagnosis not present

## 2018-10-04 DIAGNOSIS — A048 Other specified bacterial intestinal infections: Secondary | ICD-10-CM | POA: Diagnosis not present

## 2018-10-04 DIAGNOSIS — R1084 Generalized abdominal pain: Secondary | ICD-10-CM | POA: Diagnosis not present

## 2018-10-25 DIAGNOSIS — R1084 Generalized abdominal pain: Secondary | ICD-10-CM | POA: Diagnosis not present

## 2018-10-25 DIAGNOSIS — M25562 Pain in left knee: Secondary | ICD-10-CM | POA: Diagnosis not present

## 2018-10-25 DIAGNOSIS — M25561 Pain in right knee: Secondary | ICD-10-CM | POA: Diagnosis not present

## 2018-11-07 DIAGNOSIS — Z79899 Other long term (current) drug therapy: Secondary | ICD-10-CM | POA: Diagnosis not present

## 2018-11-07 DIAGNOSIS — M25561 Pain in right knee: Secondary | ICD-10-CM | POA: Diagnosis not present

## 2018-11-07 DIAGNOSIS — F902 Attention-deficit hyperactivity disorder, combined type: Secondary | ICD-10-CM | POA: Diagnosis not present

## 2018-11-07 DIAGNOSIS — A048 Other specified bacterial intestinal infections: Secondary | ICD-10-CM | POA: Diagnosis not present

## 2018-12-07 DIAGNOSIS — Z1159 Encounter for screening for other viral diseases: Secondary | ICD-10-CM | POA: Diagnosis not present

## 2018-12-07 DIAGNOSIS — E559 Vitamin D deficiency, unspecified: Secondary | ICD-10-CM | POA: Diagnosis not present

## 2018-12-07 DIAGNOSIS — R300 Dysuria: Secondary | ICD-10-CM | POA: Diagnosis not present

## 2018-12-07 DIAGNOSIS — M129 Arthropathy, unspecified: Secondary | ICD-10-CM | POA: Diagnosis not present

## 2018-12-07 DIAGNOSIS — A048 Other specified bacterial intestinal infections: Secondary | ICD-10-CM | POA: Diagnosis not present

## 2018-12-07 DIAGNOSIS — M25561 Pain in right knee: Secondary | ICD-10-CM | POA: Diagnosis not present

## 2018-12-07 DIAGNOSIS — Z79899 Other long term (current) drug therapy: Secondary | ICD-10-CM | POA: Diagnosis not present

## 2018-12-07 DIAGNOSIS — R5383 Other fatigue: Secondary | ICD-10-CM | POA: Diagnosis not present

## 2018-12-07 DIAGNOSIS — E7800 Pure hypercholesterolemia, unspecified: Secondary | ICD-10-CM | POA: Diagnosis not present

## 2019-01-16 ENCOUNTER — Telehealth (HOSPITAL_COMMUNITY): Payer: Self-pay | Admitting: Licensed Clinical Social Worker

## 2019-01-16 NOTE — Telephone Encounter (Signed)
CSW contacted patient to follow up and offer support due to Covid-19 and inability to meet with [facility].  Patient states he is doing well and managing his health needs. CSW offered support and encouragement. Patient denies any concerns and looking forward to getting together someday soon with the [facility]. Jackie Maigan Bittinger, LCSW, CCSW-MCS [phone] 

## 2019-05-27 ENCOUNTER — Other Ambulatory Visit: Payer: Self-pay | Admitting: Family Medicine

## 2019-05-27 DIAGNOSIS — M545 Low back pain, unspecified: Secondary | ICD-10-CM

## 2019-06-04 ENCOUNTER — Other Ambulatory Visit: Payer: Medicare Other

## 2019-06-12 ENCOUNTER — Other Ambulatory Visit: Payer: Medicare Other

## 2019-06-14 ENCOUNTER — Other Ambulatory Visit: Payer: Self-pay

## 2019-06-14 ENCOUNTER — Ambulatory Visit
Admission: RE | Admit: 2019-06-14 | Discharge: 2019-06-14 | Disposition: A | Discharge status: Home / Self Care | Payer: Medicare Other | Source: Ambulatory Visit | Attending: Family Medicine | Admitting: Family Medicine

## 2019-06-14 DIAGNOSIS — M545 Low back pain, unspecified: Secondary | ICD-10-CM

## 2019-11-15 ENCOUNTER — Telehealth (HOSPITAL_COMMUNITY): Payer: Self-pay | Admitting: Licensed Clinical Social Worker

## 2019-11-15 NOTE — Telephone Encounter (Signed)
CSW contacted patient to inform that the [facility] is starting back up. Meeting will be held on Tuesday November 19, 2019 at 3:30pm in the [facility] Conference Room. Message left. Jackie Tyrae Alcoser, LCSW, CCSW-MCS [phone] 

## 2020-02-20 ENCOUNTER — Inpatient Hospital Stay (HOSPITAL_COMMUNITY)
Admission: EM | Admit: 2020-02-20 | Discharge: 2020-02-28 | DRG: 871 | Disposition: E | Payer: Medicare Other | Attending: Pulmonary Disease | Admitting: Pulmonary Disease

## 2020-02-20 ENCOUNTER — Emergency Department (HOSPITAL_COMMUNITY): Payer: Medicare Other

## 2020-02-20 ENCOUNTER — Other Ambulatory Visit: Payer: Self-pay

## 2020-02-20 ENCOUNTER — Encounter (HOSPITAL_COMMUNITY): Payer: Self-pay | Admitting: Pulmonary Disease

## 2020-02-20 DIAGNOSIS — T884XXA Failed or difficult intubation, initial encounter: Secondary | ICD-10-CM

## 2020-02-20 DIAGNOSIS — N179 Acute kidney failure, unspecified: Secondary | ICD-10-CM | POA: Diagnosis present

## 2020-02-20 DIAGNOSIS — J1282 Pneumonia due to coronavirus disease 2019: Secondary | ICD-10-CM | POA: Diagnosis present

## 2020-02-20 DIAGNOSIS — A419 Sepsis, unspecified organism: Secondary | ICD-10-CM | POA: Diagnosis not present

## 2020-02-20 DIAGNOSIS — E875 Hyperkalemia: Secondary | ICD-10-CM | POA: Diagnosis present

## 2020-02-20 DIAGNOSIS — G9341 Metabolic encephalopathy: Secondary | ICD-10-CM | POA: Diagnosis present

## 2020-02-20 DIAGNOSIS — I2510 Atherosclerotic heart disease of native coronary artery without angina pectoris: Secondary | ICD-10-CM | POA: Diagnosis present

## 2020-02-20 DIAGNOSIS — J189 Pneumonia, unspecified organism: Secondary | ICD-10-CM | POA: Diagnosis not present

## 2020-02-20 DIAGNOSIS — E869 Volume depletion, unspecified: Secondary | ICD-10-CM | POA: Diagnosis present

## 2020-02-20 DIAGNOSIS — I10 Essential (primary) hypertension: Secondary | ICD-10-CM | POA: Diagnosis present

## 2020-02-20 DIAGNOSIS — G4733 Obstructive sleep apnea (adult) (pediatric): Secondary | ICD-10-CM | POA: Diagnosis present

## 2020-02-20 DIAGNOSIS — J45909 Unspecified asthma, uncomplicated: Secondary | ICD-10-CM | POA: Diagnosis present

## 2020-02-20 DIAGNOSIS — R6520 Severe sepsis without septic shock: Secondary | ICD-10-CM

## 2020-02-20 DIAGNOSIS — J9600 Acute respiratory failure, unspecified whether with hypoxia or hypercapnia: Secondary | ICD-10-CM

## 2020-02-20 DIAGNOSIS — Z515 Encounter for palliative care: Secondary | ICD-10-CM | POA: Diagnosis not present

## 2020-02-20 DIAGNOSIS — E1165 Type 2 diabetes mellitus with hyperglycemia: Secondary | ICD-10-CM | POA: Diagnosis present

## 2020-02-20 DIAGNOSIS — T380X5A Adverse effect of glucocorticoids and synthetic analogues, initial encounter: Secondary | ICD-10-CM | POA: Diagnosis present

## 2020-02-20 DIAGNOSIS — U071 COVID-19: Secondary | ICD-10-CM | POA: Diagnosis present

## 2020-02-20 DIAGNOSIS — Z7982 Long term (current) use of aspirin: Secondary | ICD-10-CM

## 2020-02-20 DIAGNOSIS — E872 Acidosis: Secondary | ICD-10-CM | POA: Diagnosis present

## 2020-02-20 DIAGNOSIS — R0902 Hypoxemia: Secondary | ICD-10-CM

## 2020-02-20 DIAGNOSIS — J9601 Acute respiratory failure with hypoxia: Secondary | ICD-10-CM | POA: Diagnosis present

## 2020-02-20 DIAGNOSIS — E785 Hyperlipidemia, unspecified: Secondary | ICD-10-CM | POA: Diagnosis present

## 2020-02-20 DIAGNOSIS — Z7952 Long term (current) use of systemic steroids: Secondary | ICD-10-CM

## 2020-02-20 DIAGNOSIS — K5900 Constipation, unspecified: Secondary | ICD-10-CM | POA: Diagnosis not present

## 2020-02-20 DIAGNOSIS — E871 Hypo-osmolality and hyponatremia: Secondary | ICD-10-CM | POA: Diagnosis present

## 2020-02-20 DIAGNOSIS — Z79899 Other long term (current) drug therapy: Secondary | ICD-10-CM

## 2020-02-20 DIAGNOSIS — E870 Hyperosmolality and hypernatremia: Secondary | ICD-10-CM | POA: Diagnosis not present

## 2020-02-20 DIAGNOSIS — A4189 Other specified sepsis: Secondary | ICD-10-CM | POA: Diagnosis present

## 2020-02-20 DIAGNOSIS — Z66 Do not resuscitate: Secondary | ICD-10-CM | POA: Diagnosis not present

## 2020-02-20 DIAGNOSIS — R6521 Severe sepsis with septic shock: Secondary | ICD-10-CM | POA: Diagnosis present

## 2020-02-20 DIAGNOSIS — R7989 Other specified abnormal findings of blood chemistry: Secondary | ICD-10-CM

## 2020-02-20 DIAGNOSIS — D869 Sarcoidosis, unspecified: Secondary | ICD-10-CM | POA: Diagnosis present

## 2020-02-20 DIAGNOSIS — J159 Unspecified bacterial pneumonia: Secondary | ICD-10-CM | POA: Diagnosis present

## 2020-02-20 DIAGNOSIS — I248 Other forms of acute ischemic heart disease: Secondary | ICD-10-CM | POA: Diagnosis present

## 2020-02-20 DIAGNOSIS — N170 Acute kidney failure with tubular necrosis: Secondary | ICD-10-CM | POA: Diagnosis not present

## 2020-02-20 DIAGNOSIS — Z6841 Body Mass Index (BMI) 40.0 and over, adult: Secondary | ICD-10-CM | POA: Diagnosis not present

## 2020-02-20 DIAGNOSIS — Z4659 Encounter for fitting and adjustment of other gastrointestinal appliance and device: Secondary | ICD-10-CM

## 2020-02-20 DIAGNOSIS — J80 Acute respiratory distress syndrome: Secondary | ICD-10-CM

## 2020-02-20 DIAGNOSIS — R9389 Abnormal findings on diagnostic imaging of other specified body structures: Secondary | ICD-10-CM

## 2020-02-20 LAB — COMPREHENSIVE METABOLIC PANEL
ALT: 50 U/L — ABNORMAL HIGH (ref 0–44)
AST: 241 U/L — ABNORMAL HIGH (ref 15–41)
Albumin: 2.9 g/dL — ABNORMAL LOW (ref 3.5–5.0)
Alkaline Phosphatase: 41 U/L (ref 38–126)
Anion gap: 23 — ABNORMAL HIGH (ref 5–15)
BUN: 79 mg/dL — ABNORMAL HIGH (ref 6–20)
CO2: 20 mmol/L — ABNORMAL LOW (ref 22–32)
Calcium: 9 mg/dL (ref 8.9–10.3)
Chloride: 87 mmol/L — ABNORMAL LOW (ref 98–111)
Creatinine, Ser: 7.67 mg/dL — ABNORMAL HIGH (ref 0.61–1.24)
GFR calc Af Amer: 8 mL/min — ABNORMAL LOW (ref >60)
GFR calc non Af Amer: 7 mL/min — ABNORMAL LOW (ref >60)
Glucose, Bld: 335 mg/dL — ABNORMAL HIGH (ref 70–99)
Potassium: 5.3 mmol/L — ABNORMAL HIGH (ref 3.5–5.1)
Sodium: 130 mmol/L — ABNORMAL LOW (ref 135–145)
Total Bilirubin: 1.1 mg/dL (ref 0.3–1.2)
Total Protein: 7.5 g/dL (ref 6.5–8.1)

## 2020-02-20 LAB — CBC WITH DIFFERENTIAL/PLATELET
Abs Immature Granulocytes: 0.07 10*3/uL (ref 0.00–0.07)
Basophils Absolute: 0 10*3/uL (ref 0.0–0.1)
Basophils Relative: 0 %
Eosinophils Absolute: 0 10*3/uL (ref 0.0–0.5)
Eosinophils Relative: 0 %
HCT: 45.9 % (ref 39.0–52.0)
Hemoglobin: 14.3 g/dL (ref 13.0–17.0)
Immature Granulocytes: 1 %
Lymphocytes Relative: 12 %
Lymphs Abs: 1.2 10*3/uL (ref 0.7–4.0)
MCH: 30.1 pg (ref 26.0–34.0)
MCHC: 31.2 g/dL (ref 30.0–36.0)
MCV: 96.6 fL (ref 80.0–100.0)
Monocytes Absolute: 0.9 10*3/uL (ref 0.1–1.0)
Monocytes Relative: 9 %
Neutro Abs: 7.8 10*3/uL — ABNORMAL HIGH (ref 1.7–7.7)
Neutrophils Relative %: 78 %
Platelets: 136 10*3/uL — ABNORMAL LOW (ref 150–400)
RBC: 4.75 MIL/uL (ref 4.22–5.81)
RDW: 12.3 % (ref 11.5–15.5)
WBC: 9.9 10*3/uL (ref 4.0–10.5)
nRBC: 0.6 % — ABNORMAL HIGH (ref 0.0–0.2)

## 2020-02-20 LAB — SARS CORONAVIRUS 2 BY RT PCR (HOSPITAL ORDER, PERFORMED IN ~~LOC~~ HOSPITAL LAB): SARS Coronavirus 2: POSITIVE — AB

## 2020-02-20 LAB — TROPONIN I (HIGH SENSITIVITY)
Troponin I (High Sensitivity): 75 ng/L — ABNORMAL HIGH (ref <18)
Troponin I (High Sensitivity): 68 ng/L — ABNORMAL HIGH (ref <18)

## 2020-02-20 LAB — LACTIC ACID, PLASMA
Lactic Acid, Venous: 6 mmol/L (ref 0.5–1.9)
Lactic Acid, Venous: 4.6 mmol/L (ref 0.5–1.9)

## 2020-02-20 LAB — MRSA PCR SCREENING: MRSA by PCR: NEGATIVE

## 2020-02-20 LAB — GLUCOSE, CAPILLARY: Glucose-Capillary: 311 mg/dL — ABNORMAL HIGH (ref 70–99)

## 2020-02-20 LAB — FIBRINOGEN: Fibrinogen: 625 mg/dL — ABNORMAL HIGH (ref 210–475)

## 2020-02-20 LAB — BLOOD GAS, ARTERIAL
Acid-base deficit: 8.6 mmol/L — ABNORMAL HIGH (ref 0.0–2.0)
Bicarbonate: 15.8 mmol/L — ABNORMAL LOW (ref 20.0–28.0)
Drawn by: 270211
FIO2: 80
O2 Content: 15 L/min
O2 Saturation: 99.3 %
Patient temperature: 98.6
pCO2 arterial: 31.2 mmHg — ABNORMAL LOW (ref 32.0–48.0)
pH, Arterial: 7.326 — ABNORMAL LOW (ref 7.350–7.450)
pO2, Arterial: 191 mmHg — ABNORMAL HIGH (ref 83.0–108.0)

## 2020-02-20 LAB — MAGNESIUM: Magnesium: 2.4 mg/dL (ref 1.7–2.4)

## 2020-02-20 LAB — C-REACTIVE PROTEIN: CRP: 22.6 mg/dL — ABNORMAL HIGH (ref <1.0)

## 2020-02-20 LAB — TRIGLYCERIDES: Triglycerides: 497 mg/dL — ABNORMAL HIGH (ref <150)

## 2020-02-20 LAB — BRAIN NATRIURETIC PEPTIDE: B Natriuretic Peptide: 64.7 pg/mL (ref 0.0–100.0)

## 2020-02-20 LAB — D-DIMER, QUANTITATIVE: D-Dimer, Quant: 3.89 ug{FEU}/mL — ABNORMAL HIGH (ref 0.00–0.50)

## 2020-02-20 LAB — LACTATE DEHYDROGENASE: LDH: 1041 U/L — ABNORMAL HIGH (ref 98–192)

## 2020-02-20 LAB — FERRITIN: Ferritin: >7500 ng/mL — ABNORMAL HIGH (ref 24–336)

## 2020-02-20 LAB — PROCALCITONIN: Procalcitonin: 7.4 ng/mL

## 2020-02-20 MED ORDER — SODIUM CHLORIDE 0.9 % IV SOLN
2.0000 g | INTRAVENOUS | Status: AC
  Administered 2020-02-21 – 2020-02-26 (×6): 2 g via INTRAVENOUS
  Filled 2020-02-20 (×6): qty 2

## 2020-02-20 MED ORDER — ORAL CARE MOUTH RINSE
15.0000 mL | Freq: Two times a day (BID) | OROMUCOSAL | Status: DC
  Administered 2020-02-21 – 2020-02-26 (×11): 15 mL via OROMUCOSAL

## 2020-02-20 MED ORDER — LINEZOLID 600 MG/300ML IV SOLN
600.0000 mg | Freq: Two times a day (BID) | INTRAVENOUS | Status: DC
  Administered 2020-02-20: 600 mg via INTRAVENOUS
  Filled 2020-02-20 (×2): qty 300

## 2020-02-20 MED ORDER — SODIUM CHLORIDE 0.9 % IV BOLUS
500.0000 mL | Freq: Once | INTRAVENOUS | Status: AC
  Administered 2020-02-20: 500 mL via INTRAVENOUS

## 2020-02-20 MED ORDER — SIMVASTATIN 40 MG PO TABS
40.0000 mg | ORAL_TABLET | Freq: Every day | ORAL | Status: DC
  Administered 2020-02-20 – 2020-02-24 (×5): 40 mg via ORAL
  Filled 2020-02-20 (×5): qty 1

## 2020-02-20 MED ORDER — METHYLPREDNISOLONE SODIUM SUCC 125 MG IJ SOLR: 60.0000 mg | Freq: Three times a day (TID) | INTRAMUSCULAR | Status: DC

## 2020-02-20 MED ORDER — LACTATED RINGERS IV SOLN: INTRAVENOUS | Status: DC

## 2020-02-20 MED ORDER — ONDANSETRON HCL 4 MG/2ML IJ SOLN
4.0000 mg | Freq: Once | INTRAMUSCULAR | Status: AC
  Administered 2020-02-20: 4 mg via INTRAVENOUS
  Filled 2020-02-20: qty 2

## 2020-02-20 MED ORDER — HEPARIN SODIUM (PORCINE) 5000 UNIT/ML IJ SOLN
5000.0000 [IU] | Freq: Three times a day (TID) | INTRAMUSCULAR | Status: DC
  Administered 2020-02-20 – 2020-02-27 (×21): 5000 [IU] via SUBCUTANEOUS
  Filled 2020-02-20 (×20): qty 1

## 2020-02-20 MED ORDER — MONTELUKAST SODIUM 10 MG PO TABS
10.0000 mg | ORAL_TABLET | Freq: Every day | ORAL | Status: DC
  Administered 2020-02-20 – 2020-02-24 (×5): 10 mg via ORAL
  Filled 2020-02-20 (×6): qty 1

## 2020-02-20 MED ORDER — ASPIRIN EC 81 MG PO TBEC
81.0000 mg | DELAYED_RELEASE_TABLET | Freq: Every day | ORAL | Status: DC
  Administered 2020-02-20 – 2020-02-25 (×6): 81 mg via ORAL
  Filled 2020-02-20 (×6): qty 1

## 2020-02-20 MED ORDER — METHYLPREDNISOLONE SODIUM SUCC 125 MG IJ SOLR
60.0000 mg | Freq: Three times a day (TID) | INTRAMUSCULAR | Status: DC
  Administered 2020-02-20 – 2020-02-25 (×14): 60 mg via INTRAVENOUS
  Filled 2020-02-20 (×15): qty 2

## 2020-02-20 MED ORDER — PANTOPRAZOLE SODIUM 40 MG PO TBEC
40.0000 mg | DELAYED_RELEASE_TABLET | Freq: Every day | ORAL | Status: DC
  Administered 2020-02-20 – 2020-02-25 (×6): 40 mg via ORAL
  Filled 2020-02-20 (×6): qty 1

## 2020-02-20 MED ORDER — CHLORHEXIDINE GLUCONATE CLOTH 2 % EX PADS
6.0000 | MEDICATED_PAD | Freq: Every day | CUTANEOUS | Status: DC
  Administered 2020-02-20 – 2020-02-27 (×8): 6 via TOPICAL

## 2020-02-20 MED ORDER — INSULIN ASPART 100 UNIT/ML ~~LOC~~ SOLN
0.0000 [IU] | Freq: Every day | SUBCUTANEOUS | Status: DC
  Administered 2020-02-21: 4 [IU] via SUBCUTANEOUS

## 2020-02-20 MED ORDER — SODIUM CHLORIDE 0.9 % IV SOLN
500.0000 mg | INTRAVENOUS | Status: DC
  Filled 2020-02-20: qty 500

## 2020-02-20 MED ORDER — SODIUM CHLORIDE 0.9 % IV SOLN
2.0000 g | INTRAVENOUS | Status: DC
  Filled 2020-02-20: qty 20

## 2020-02-20 MED ORDER — SODIUM CHLORIDE 0.9% FLUSH
3.0000 mL | Freq: Two times a day (BID) | INTRAVENOUS | Status: DC
  Administered 2020-02-20 – 2020-02-27 (×11): 3 mL via INTRAVENOUS

## 2020-02-20 MED ORDER — INSULIN ASPART 100 UNIT/ML ~~LOC~~ SOLN: 0.0000 [IU] | Freq: Three times a day (TID) | SUBCUTANEOUS | Status: DC

## 2020-02-20 MED ORDER — SODIUM CHLORIDE 0.9 % IV SOLN
2.0000 g | Freq: Once | INTRAVENOUS | Status: AC
  Administered 2020-02-20: 2 g via INTRAVENOUS
  Filled 2020-02-20: qty 2

## 2020-02-20 MED ORDER — DEXAMETHASONE SODIUM PHOSPHATE 10 MG/ML IJ SOLN
10.0000 mg | Freq: Once | INTRAMUSCULAR | Status: AC
  Administered 2020-02-20: 10 mg via INTRAVENOUS
  Filled 2020-02-20: qty 1

## 2020-02-20 MED ORDER — INSULIN ASPART 100 UNIT/ML ~~LOC~~ SOLN
0.0000 [IU] | Freq: Three times a day (TID) | SUBCUTANEOUS | Status: DC
  Filled 2020-02-20: qty 0.2

## 2020-02-20 MED ORDER — LACTATED RINGERS IV BOLUS
1000.0000 mL | Freq: Once | INTRAVENOUS | Status: AC
  Administered 2020-02-20: 1000 mL via INTRAVENOUS

## 2020-02-20 MED ORDER — SODIUM CHLORIDE 0.9 % IV BOLUS: 500.0000 mL | Freq: Once | INTRAVENOUS | Status: DC

## 2020-02-20 NOTE — Progress Notes (Signed)
PHARMACY NOTE -  Cefepime  Pharmacy has been assisting with dosing of cefepime for PNA.  Renal function is not stable, but pharmacy will follow peripherally, and renal dose adjustments covered by systemwide abx protocol.  Pharmacy will sign off, following peripherally for culture results or dose adjustments. Please reconsult if a change in clinical status warrants re-evaluation of dosage.  Bernadene Person, PharmD, BCPS [phone] 02/11/2020, 7:04 PM

## 2020-02-20 NOTE — ED Notes (Signed)
IV placed in right AC, unable to obtain any additional IV access at this time. x1 set [facility] sent.

## 2020-02-20 NOTE — H&P (Signed)
NAME:  [name], MRN:  [id number], DOB:  [birth year], LOS: 0 ADMISSION DATE:  02/23/2020, CONSULTATION DATE: 9/23 REFERRING MD: Dr. Criss Alvine, CHIEF COMPLAINT: COVID-19 pneumonia  Brief History   54 year old male admitted with presented with shortness of breath 9/23.  Found to be hypotensive and in acute renal failure.  Hypotension responded to fluids.  PCCM consulted.  History of present illness   55 year old male with past medical history as below, which is significant for asthma, sarcoidosis, hypertension, and coronary artery disease.  He was in his usual state of health until Monday, September 20 when he developed upper respiratory symptoms including cough and shortness of breath.  Associated symptoms include nausea and vomiting.  He was not vaccinated for COVID-19.  Poor oral intake over the course of his illness.  Upon arrival to the emergency department he was found to be hypoxemic despite 10 L oxygen.  He was markedly dyspneic.  He was also hypotensive with systolic blood pressure less than 90.  Lactic acid greater than 6.  He was given IV fluid, to which his blood pressure did respond.  Laboratory evaluation in the emergency department significant for serum creatinine 7.6, potassium 5.3, sodium 130, glucose 335, anion gap 23, AST 241.   Past Medical History   has a past medical history of Asthma, CAD (coronary artery disease), Depression, DJD (degenerative joint disease), GI bleed, Glucose intolerance (impaired glucose tolerance), Headache(784.0), Helicobacter pylori gastritis, HTN (hypertension), Low back pain, Morbid obesity (HCC), Sarcoidosis, and Uveitis.  Significant Hospital Events   9/23 Admit  Consults:    Procedures:    Significant Diagnostic Tests:    Micro Data:  COVID 9/23 >> positive MRSA Screen 9/23 >> Legionella Ag 9/23 >> Pneumococcal Ag 9/23 >> Blood 9/23 >> HIV 9/23 >>   Antimicrobials/COVID therapy:  Vancomycin 9/23 >. Cefepime 9/23 >>    Solumedrol 9/23 >>    Interim history/subjective:    Objective   Blood pressure (!) 102/91, pulse 84, temperature 97.6 F (36.4 C), temperature source Oral, resp. rate 15, height 5\' 10"  (1.778 m), SpO2 94 %.    FiO2 (%):  [100 %] 100 %  No intake or output data in the 24 hours ending 02/21/2020 1728 There were no vitals filed for this visit.  Examination:  General - alert Eyes - pupils reactive ENT - no sinus tenderness, no stridor Cardiac - regular rate/rhythm, no murmur Chest - diminished breath sounds, scattered rhonchi Abdomen - soft, non tender, + bowel sounds Extremities - 1+ non pitting edema Skin - chronic venous stasis changes Neuro - normal strength, moves extremities, follows commands Psych - normal mood and behavior   Resolved Hospital Problem list     Assessment & Plan:   Acute hypoxic respiratory failure 2nd to COVID 19 pneumonia. - continue solumedrol - defer remdesivir in setting of elevated LFTs - defer baricitinib in setting of positive procalcitonin and possibility of bacterial infection - goal SpO2 85 to 95% - prone positioning as able - f/u CXR - bronchial hygiene  Pneumonia. - he is at risk for MRSA, gram negative infections including Pseudomonas given chronic immunosuppressive mediation use - has elevated procalcitonin - check MRSA PCR, pneumococcal Ag, legionella Ag - vancomycin, cefepime  Hx of asthma. - continue singulair - prn albuterol  Acute renal failure from volume depletion. Anion gap metabolic acidosis with lactic acidosis. Hyperkalemia from acidosis. - continue IV fluids - monitor renal fx, urine outpt  Severe sepsis with lactic acidosis. - continue IV fluids  Elevated troponin from demand ischemia. Hx of CAD, HTN, HLD. - hold outpt toprol, lasix, cozaar - continue ASA, zocor  Elevated LFTs. - f/u CMET  Hx of peripheral focal chorioretinal inflammation. - followed by ophthalmology at [facility] by Dr. Gae Bon - hold  outpt imuran  Morbid obesity with BMI 67.44 - needs to work on weight loss  Hx of chronic pain. - hold outpt adderall, buprenorphine, oxycodone, tramadol  Hyperglycemia. - SSI - check HbA1C   Best practice:  Diet: heart healthy DVT prophylaxis: SQ heparin GI prophylaxis: protonix Mobility: OOB to chair as able Code Status: full code Disposition: ICU  Labs   CBC: Recent Labs  Lab Mar 19, 2020 1309  WBC 9.9  NEUTROABS 7.8*  HGB 14.3  HCT 45.9  MCV 96.6  PLT 136*    Basic Metabolic Panel: Recent Labs  Lab 03-19-2020 1309 19-Mar-2020 1600  NA 130*  --   K 5.3*  --   CL 87*  --   CO2 20*  --   GLUCOSE 335*  --   BUN 79*  --   CREATININE 7.67*  --   CALCIUM 9.0  --   MG  --  2.4   GFR: CrCl cannot be calculated (Unknown ideal weight.). Recent Labs  Lab 03-19-20 1309 March 19, 2020 1600  PROCALCITON 7.40  --   WBC 9.9  --   LATICACIDVEN 6.0* 4.6*    Liver Function Tests: Recent Labs  Lab 2020-03-19 1309  AST 241*  ALT 50*  ALKPHOS 41  BILITOT 1.1  PROT 7.5  ALBUMIN 2.9*   No results for input(s): LIPASE, AMYLASE in the last 168 hours. No results for input(s): AMMONIA in the last 168 hours.  ABG    Component Value Date/Time   PHART 7.326 (L) 03-19-20 1315   PCO2ART 31.2 (L) 2020/03/19 1315   PO2ART 191 (H) 2020-03-19 1315   HCO3 15.8 (L) Mar 19, 2020 1315   TCO2 31 08/23/2009 2150   ACIDBASEDEF 8.6 (H) 03-19-20 1315   O2SAT 99.3 03-19-20 1315     Coagulation Profile: No results for input(s): INR, PROTIME in the last 168 hours.  Cardiac Enzymes: No results for input(s): CKTOTAL, CKMB, CKMBINDEX, TROPONINI in the last 168 hours.  HbA1C: Hgb A1c MFr Bld  Date/Time Value Ref Range Status  07/08/2013 09:53 AM 5.4 4.6 - 6.5 % Final    Comment:    Glycemic Control Guidelines for People with Diabetes:Non Diabetic:  <6%Goal of Therapy: <7%Additional Action Suggested:  >8%   06/11/2010 09:30 AM 5.3 4.6 - 6.5 % Final    Comment:    Glycemic Control  Guidelines for People with Diabetes:Non Diabetic:  <6%Goal of Therapy: <7%Additional Action Suggested:  >8%     CBG: No results for input(s): GLUCAP in the last 168 hours.  Review of Systems:   Reviewed and negative  Past Medical History  He,  has a past medical history of Asthma, CAD (coronary artery disease), Depression, DJD (degenerative joint disease), GI bleed, Glucose intolerance (impaired glucose tolerance), Headache(784.0), Helicobacter pylori gastritis, HTN (hypertension), Low back pain, Morbid obesity (HCC), Sarcoidosis, and Uveitis.   Surgical History    Past Surgical History:  Procedure Laterality Date  . CATARACT EXTRACTION       Social History   reports that he has never smoked. He has never used smokeless tobacco. He reports that he does not drink alcohol and does not use drugs.   Family History   His family history includes Diabetes in his brother; Esophageal cancer in  his father; Heart disease in his mother. There is no history of Colon cancer, Colon polyps, or Kidney disease.   Allergies Allergies  Allergen Reactions  . Codeine      Home Medications  Prior to Admission medications   Medication Sig Start Date End Date Taking? Authorizing Provider  albuterol (VENTOLIN HFA) 108 (90 Base) MCG/ACT inhaler Inhale 1-2 puffs into the lungs every 4 (four) hours as needed for shortness of breath. 01/29/20   [provider]  amphetamine-dextroamphetamine (ADDERALL) 10 MG tablet Take 10 mg by mouth 3 (three) times daily as needed. 12/03/19   [provider]  aspirin 81 MG tablet Take 81 mg by mouth daily. Reported on 10/07/2015    [provider]  azaTHIOprine (IMURAN) 50 MG tablet SMARTSIG:Tablet(s) By Mouth 01/07/20   [provider]  buprenorphine (BUTRANS) 5 MCG/HR PTWK Place 1 patch onto the skin once a week. 01/30/20   [provider]  famotidine (PEPCID) 20 MG tablet Take 20 mg by mouth daily. 01/30/20   [provider]    furosemide (LASIX) 40 MG tablet Take 1 tablet (40 mg total) by mouth daily. 12/04/17   Gordy Savers, MD  losartan (COZAAR) 50 MG tablet Take 50 mg by mouth daily. 02/12/20   [provider]  Meclizine HCl 25 MG CHEW Chew 1 tablet (25 mg total) by mouth 4 (four) times daily as needed. 12/29/16   Gordy Savers, MD  metoprolol succinate (TOPROL-XL) 50 MG 24 hr tablet Take 1 tablet (50 mg total) by mouth daily. Take with or immediately following a meal. 12/04/17   Gordy Savers, MD  montelukast (SINGULAIR) 10 MG tablet Take 10 mg by mouth at bedtime. 01/27/20   [provider]  Oxycodone HCl 20 MG TABS Take 1 tablet by mouth every 4 (four) hours as needed for severe pain. 01/29/20   [provider]  pantoprazole (PROTONIX) 40 MG tablet TAKE 1 TABLET BY MOUTH EVERY DAY 02/28/18   Roderick Pee, MD  predniSONE (DELTASONE) 10 MG tablet Take 20 mg by mouth daily. 01/14/20   [provider]  simvastatin (ZOCOR) 40 MG tablet Take 40 mg by mouth at bedtime. 01/24/20   [provider]  traMADol (ULTRAM) 50 MG tablet Take 1 tablet (50 mg total) by mouth every 6 (six) hours as needed. 12/04/17   Gordy Savers, MD  lisinopril (PRINIVIL,ZESTRIL) 20 MG tablet Take 1 tablet (20 mg total) by mouth daily. 07/17/14 08/27/14  Gordy Savers, MD     Critical care time: 47 minutes  Coralyn Helling, MD [facility] Pulmonary/Critical Care Pager - [phone] 02/15/2020, 5:37 PM

## 2020-02-20 NOTE — ED Notes (Signed)
IVF placed on pressure bag to achieve desired run rate.

## 2020-02-20 NOTE — ED Provider Notes (Signed)
[facility]-EMERGENCY DEPT Provider Note   CSN: [id number] Arrival date & time: 02/23/2020  1202     History Chief Complaint  Patient presents with  . Shortness of Breath    [name] is a 54 y.o. male.  HPI      [name] is a 54 y.o. male, with a history of morbid obesity, CAD, HTN, hyperlipidemia, asthma, presenting to the ED with shortness of breath worsening over the last 4 days.  Patient states he began having symptoms of cough, shortness of breath, nausea, vomiting Monday, September 20. Not vaccinated for Covid. Denies fever, chest pain, syncope, lower extremity edema/pain, diarrhea, hematochezia/melena, abdominal pain, or any other complaints.   Past Medical History:  Diagnosis Date  . Asthma   . CAD (coronary artery disease)    nonobstructive -- with LVH but preserved EF  . Depression   . DJD (degenerative joint disease)   . GI bleed   . Glucose intolerance (impaired glucose tolerance)   . Headache(784.0)   . Helicobacter pylori gastritis   . HTN (hypertension)   . Low back pain   . Morbid obesity (HCC)   . Sarcoidosis   . Uveitis     Patient Active Problem List   Diagnosis Date Noted  . Iritis 03/30/2017  . Primary osteoarthritis of both knees 03/30/2017  . Osteoarthritis of right knee 12/04/2014  . Left ventricular hypertrophy 06/04/2012  . History of migraine headaches 06/04/2012  . Coronary atherosclerosis 06/16/2010  . VENTRICULAR TACHYCARDIA, PAROXYSMAL 06/11/2010  . ACUTE PROSTATITIS 12/03/2009  . ABDOMINAL PAIN, GENERALIZED 12/03/2009  . HELICOBACTER PYLORI [H. PYLORI] INFECTION 09/07/2009  . ANXIETY DEPRESSION 12/18/2008  . HEADACHE 08/04/2008  . ABDOMINAL PAIN 11/20/2007  . LEG PAIN, BILATERAL 09/13/2007  . ASTHMA 06/14/2007  . LOW BACK PAIN 06/14/2007  . Glucose intolerance (impaired glucose tolerance) 06/14/2007  . Dyslipidemia 04/10/2007  . MORBID OBESITY 04/10/2007  . SARCOIDOSIS 10/05/2006    . Essential hypertension 10/05/2006    Past Surgical History:  Procedure Laterality Date  . CATARACT EXTRACTION         Family History  Problem Relation Age of Onset  . Heart disease Mother   . Diabetes Brother        x2  . Esophageal cancer Father   . Colon cancer Neg Hx   . Colon polyps Neg Hx   . Kidney disease Neg Hx     Social History   Tobacco Use  . Smoking status: Never Smoker  . Smokeless tobacco: Never Used  Vaping Use  . Vaping Use: Never used  Substance Use Topics  . Alcohol use: No  . Drug use: No    Home Medications Prior to Admission medications   Medication Sig Start Date End Date Taking? Authorizing Provider  albuterol (VENTOLIN HFA) 108 (90 Base) MCG/ACT inhaler Inhale 1-2 puffs into the lungs every 4 (four) hours as needed for shortness of breath. 01/29/20   [provider]  amphetamine-dextroamphetamine (ADDERALL) 10 MG tablet Take 10 mg by mouth 3 (three) times daily as needed. 12/03/19   [provider]  aspirin 81 MG tablet Take 81 mg by mouth daily. Reported on 10/07/2015    [provider]  azaTHIOprine (IMURAN) 50 MG tablet SMARTSIG:Tablet(s) By Mouth 01/07/20   [provider]  buprenorphine (BUTRANS) 5 MCG/HR PTWK Place 1 patch onto the skin once a week. 01/30/20   [provider]  famotidine (PEPCID) 20 MG tablet Take 20 mg by mouth  daily. 01/30/20   [provider]  furosemide (LASIX) 40 MG tablet Take 1 tablet (40 mg total) by mouth daily. 12/04/17   Gordy SaversKwiatkowski, Peter F, MD  losartan (COZAAR) 50 MG tablet Take 50 mg by mouth daily. 02/12/20   [provider]  Meclizine HCl 25 MG CHEW Chew 1 tablet (25 mg total) by mouth 4 (four) times daily as needed. 12/29/16   Gordy SaversKwiatkowski, Peter F, MD  metoprolol succinate (TOPROL-XL) 50 MG 24 hr tablet Take 1 tablet (50 mg total) by mouth daily. Take with or immediately following a meal. 12/04/17   Gordy SaversKwiatkowski, Peter F, MD  montelukast (SINGULAIR) 10 MG  tablet Take 10 mg by mouth at bedtime. 01/27/20   [provider]  Oxycodone HCl 20 MG TABS Take 1 tablet by mouth every 4 (four) hours as needed for severe pain. 01/29/20   [provider]  pantoprazole (PROTONIX) 40 MG tablet TAKE 1 TABLET BY MOUTH EVERY DAY 02/28/18   Roderick Peeodd, Jeffrey A, MD  predniSONE (DELTASONE) 10 MG tablet Take 20 mg by mouth daily. 01/14/20   [provider]  simvastatin (ZOCOR) 40 MG tablet Take 40 mg by mouth at bedtime. 01/24/20   [provider]  traMADol (ULTRAM) 50 MG tablet Take 1 tablet (50 mg total) by mouth every 6 (six) hours as needed. 12/04/17   Gordy SaversKwiatkowski, Peter F, MD  lisinopril (PRINIVIL,ZESTRIL) 20 MG tablet Take 1 tablet (20 mg total) by mouth daily. 07/17/14 08/27/14  Gordy SaversKwiatkowski, Peter F, MD    Allergies    Codeine  Review of Systems   Review of Systems  Constitutional: Negative for chills, diaphoresis and fever.  Respiratory: Positive for cough and shortness of breath.   Cardiovascular: Negative for chest pain and leg swelling.  Gastrointestinal: Positive for nausea and vomiting. Negative for abdominal pain and diarrhea.  Neurological: Negative for syncope and weakness.  All other systems reviewed and are negative.   Physical Exam Updated Vital Signs BP 113/64 (BP Location: Right Arm)   Pulse (!) 105   Temp 97.6 F (36.4 C) (Oral)   Resp 12   Ht 5\' 10"  (1.778 m)   SpO2 90%   BMI 67.44 kg/m   Physical Exam Vitals and nursing note reviewed.  Constitutional:      General: He is in acute distress.     Appearance: He is well-developed. He is not diaphoretic.  HENT:     Head: Normocephalic and atraumatic.     Mouth/Throat:     Mouth: Mucous membranes are dry.     Pharynx: Oropharynx is clear.  Eyes:     Conjunctiva/sclera: Conjunctivae normal.  Cardiovascular:     Rate and Rhythm: Normal rate and regular rhythm.     Pulses: Normal pulses.          Radial pulses are 2+ on the right side and 2+ on the left  side.       Posterior tibial pulses are 2+ on the right side and 2+ on the left side.     Heart sounds: Normal heart sounds.     Comments: Tactile temperature in the extremities appropriate and equal bilaterally. Pulmonary:     Effort: Tachypnea and respiratory distress present.     Breath sounds: Normal breath sounds.     Comments: Upon arrival via EMS patient SPO2 80-85% on nonrebreather at 10 L/min. SPO2 97% with nonrebreather and high flow nasal cannula, both at 10 L/min. Abdominal:     Palpations: Abdomen is soft.  Tenderness: There is no abdominal tenderness. There is no guarding.  Musculoskeletal:     Cervical back: Neck supple.     Right lower leg: No edema.     Left lower leg: No edema.  Lymphadenopathy:     Cervical: No cervical adenopathy.  Skin:    General: Skin is warm and dry.  Neurological:     Mental Status: He is alert.  Psychiatric:        Mood and Affect: Mood and affect normal.        Speech: Speech normal.        Behavior: Behavior normal.     ED Results / Procedures / Treatments   Labs (all labs ordered are listed, but only abnormal results are displayed) Labs Reviewed  SARS CORONAVIRUS 2 BY RT PCR (HOSPITAL ORDER, PERFORMED IN [facility]) - Abnormal; Notable for the following components:      Result Value   SARS Coronavirus 2 POSITIVE (*)    All other components within normal limits  LACTIC ACID, PLASMA - Abnormal; Notable for the following components:   Lactic Acid, Venous 6.0 (*)    All other components within normal limits  CBC WITH DIFFERENTIAL/PLATELET - Abnormal; Notable for the following components:   Platelets 136 (*)    nRBC 0.6 (*)    Neutro Abs 7.8 (*)    All other components within normal limits  COMPREHENSIVE METABOLIC PANEL - Abnormal; Notable for the following components:   Sodium 130 (*)    Potassium 5.3 (*)    Chloride 87 (*)    CO2 20 (*)    Glucose, Bld 335 (*)    BUN 79 (*)    Creatinine, Ser 7.67 (*)     Albumin 2.9 (*)    AST 241 (*)    ALT 50 (*)    GFR calc non Af Amer 7 (*)    GFR calc Af Amer 8 (*)    Anion gap 23 (*)    All other components within normal limits  D-DIMER, QUANTITATIVE (NOT AT [facility]) - Abnormal; Notable for the following components:   D-Dimer, Quant 3.89 (*)    All other components within normal limits  LACTATE DEHYDROGENASE - Abnormal; Notable for the following components:   LDH 1,041 (*)    All other components within normal limits  FERRITIN - Abnormal; Notable for the following components:   Ferritin >7,500 (*)    All other components within normal limits  TRIGLYCERIDES - Abnormal; Notable for the following components:   Triglycerides 497 (*)    All other components within normal limits  FIBRINOGEN - Abnormal; Notable for the following components:   Fibrinogen 625 (*)    All other components within normal limits  C-REACTIVE PROTEIN - Abnormal; Notable for the following components:   CRP 22.6 (*)    All other components within normal limits  BLOOD GAS, ARTERIAL - Abnormal; Notable for the following components:   pH, Arterial 7.326 (*)    pCO2 arterial 31.2 (*)    pO2, Arterial 191 (*)    Bicarbonate 15.8 (*)    Acid-base deficit 8.6 (*)    All other components within normal limits  TROPONIN I (HIGH SENSITIVITY) - Abnormal; Notable for the following components:   Troponin I (High Sensitivity) 75 (*)    All other components within normal limits  CULTURE, BLOOD (ROUTINE X 2)  CULTURE, BLOOD (ROUTINE X 2)  PROCALCITONIN  BRAIN NATRIURETIC PEPTIDE  LACTIC ACID, PLASMA  MAGNESIUM  URINALYSIS, ROUTINE W REFLEX MICROSCOPIC  TROPONIN I (HIGH SENSITIVITY)    EKG EKG Interpretation  Date/Time:  Thursday Feb 29, 2020 13:03:36 EDT Ventricular Rate:  99 PR Interval:    QRS Duration: 86 QT Interval:  368 QTC Calculation: 473 R Axis:   62 Text Interpretation: Sinus tachycardia Ventricular premature complex Consider right atrial enlargement Poor data  quality in current ECG precludes serial comparison Confirmed by Pricilla Loveless [phone]) on 02-29-2020 2:25:51 PM   Radiology DG Chest Port 1 View  Result Date: Feb 29, 2020 CLINICAL DATA:  COVID positive, weakness, dizziness EXAM: PORTABLE CHEST 1 VIEW COMPARISON:  02/09/2011 FINDINGS: Bilateral lower lung mixed interstitial and airspace opacities compatible with pneumonia related to COVID. These changes slightly worse in the right lower lobe. Sparing of the lung apices. No effusion or pneumothorax. Heart is enlarged. Exam is limited because of body habitus and portable technique. IMPRESSION: Bibasilar pneumonia pattern compatible with COVID pneumonia. Electronically Signed   By: Judie Petit.  Shick M.D.   On: 2020/02/29 13:44    Procedures .Critical Care Performed by: Anselm Pancoast, PA-C Authorized by: Anselm Pancoast, PA-C   Critical care provider statement:    Critical care time (minutes):  35   Critical care time was exclusive of:  Separately billable procedures and treating other patients   Critical care was necessary to treat or prevent imminent or life-threatening deterioration of the following conditions:  Respiratory failure   Critical care was time spent personally by me on the following activities:  Ordering and performing treatments and interventions, ordering and review of laboratory studies, ordering and review of radiographic studies, pulse oximetry, re-evaluation of patient's condition, review of old charts, development of treatment plan with patient or surrogate, discussions with consultants, examination of patient, obtaining history from patient or surrogate and evaluation of patient's response to treatment   I assumed direction of critical care for this patient from another provider in my specialty: no     (including critical care time)  Medications Ordered in ED Medications  dexamethasone (DECADRON) injection 10 mg (10 mg Intravenous Given February 29, 2020 1314)  ondansetron (ZOFRAN) injection 4  mg (4 mg Intravenous Given 02/29/2020 1319)  sodium chloride 0.9 % bolus 500 mL (500 mLs Intravenous New Bag/Given 02-29-20 1450)    ED Course  I have reviewed the triage vital signs and the nursing notes.  Pertinent labs & imaging results that were available during my care of the patient were reviewed by me and considered in my medical decision making (see chart for details).  Clinical Course as of Feb 20 1812  Thu 02-29-20  1445 Patient states he has been urinating very little over the past several days, but also states he has been drinking very little fluid.  Creatinine(!): 7.67 [SJ]  1521 Spoke with Dr. Ronaldo Miyamoto, hospitalist.  Requests we make contact with critical care.   [SJ]  1654 Spoke with Dr. Craige Cotta, CCM.  Agrees to come evaluate the patient.   [SJ]  1658 Spoke with Tessie Fass, NP with CCM.  She also received a page for this patient. She will touch base with [facility] CCM team.   [SJ]    Clinical Course User Index [SJ] June Rode, Hillard Danker, PA-C   MDM Rules/Calculators/A&P                            Patient presents with shortness of breath in the setting of COVID-19 infection. Hypoxic down to 80  to 85% on 10 L supplemental O2. Afebrile.  Mildly tachycardic at presentation.  Tachypneic.  Developed hypotension during ED course, though due to patient's body habitus and position changes, it was difficult to obtain consistent blood pressures.  He did seem to show improvement in his blood pressure readings after fluids.  I personally reviewed and interpreted the patient's labs and imaging studies. Elevated lactic acid suspected to be due to his hypoxia prior to arrival.  Improved with oxygenation and IV fluids. Significantly elevated creatinine suspected to be due, in part, to poor oral intake.  Bladder scan showed 0 mL.  The patient is noted to have a MAP's <65/ SBP's <90. With the current information available to me, I don't think the patient is in septic shock. The MAP's <65/ SBP's <90, is  related to hypoxia and dehydration. The patient is noted to have a lactate>4. With the current information available to me, I don't think the patient is in septic shock. The lactate>4, is related to respiratory distress/ respiratory failurein the setting of hypoxia/COVID.    Findings and plan of care discussed with Pricilla Loveless, MD. Dr. Criss Alvine personally evaluated and examined this patient.  Vitals:   02/01/2020 1442 02/13/2020 1515 01/29/2020 1530 02/09/2020 1539  BP: (!) 94/52 (!) 73/52 92/80 (!) 89/74  Pulse: (!) 103 92    Resp: (!) 31 20    Temp:      TempSrc:      SpO2: 91% 96%    Height:       Vitals:   01/31/2020 1632 02/25/2020 1700 02/13/2020 1736 02/26/2020 1800  BP: (!) 99/59 (!) 102/91 112/88 (!) 97/52  Pulse: 92 84 82   Resp: 19 15 (!) 32 (!) 24  Temp:    98.9 F (37.2 C)  TempSrc:    Oral  SpO2: 97% 94% 97% 91%  Height:         Final Clinical Impression(s) / ED Diagnoses Final diagnoses:  None    Rx / DC Orders ED Discharge Orders    None       Concepcion Living 02/19/2020 1817    Pricilla Loveless, MD 02/21/20 [phone]

## 2020-02-20 NOTE — Plan of Care (Signed)
New admission prior to this shift.   Problem: Education: Goal: Knowledge of risk factors and measures for prevention of condition will improve Outcome: Progressing   Problem: Coping: Goal: Psychosocial and spiritual needs will be supported Outcome: Progressing   Problem: Respiratory: Goal: Will maintain a patent airway Outcome: Progressing Goal: Complications related to the disease process, condition or treatment will be avoided or minimized Outcome: Progressing   Problem: Education: Goal: Knowledge of disease and its progression will improve Outcome: Progressing   Problem: Health Behavior/Discharge Planning: Goal: Ability to manage health-related needs will improve Outcome: Progressing   Problem: Clinical Measurements: Goal: Complications related to the disease process or treatment will be avoided or minimized Outcome: Progressing Goal: Dialysis access will remain free of complications Outcome: Progressing   Problem: Activity: Goal: Activity intolerance will improve Outcome: Progressing   Problem: Fluid Volume: Goal: Fluid volume balance will be maintained or improved Outcome: Progressing   Problem: Nutritional: Goal: Ability to make appropriate dietary choices will improve Outcome: Progressing   Problem: Respiratory: Goal: Respiratory symptoms related to disease process will be avoided Outcome: Progressing   Problem: Self-Concept: Goal: Body image disturbance will be avoided or minimized Outcome: Progressing   Problem: Urinary Elimination: Goal: Progression of disease will be identified and treated Outcome: Progressing

## 2020-02-20 NOTE — ED Notes (Signed)
Pt found sitting on edge of bed, pulled monitor off, removed o2. Pt redirected back to bed, explained need to stay in bed and keep monitoring and o2 on.

## 2020-02-20 NOTE — Progress Notes (Signed)
eLink Physician-Brief Progress Note Patient Name: [name] DOB: Jun 26, 1965 MRN: [id number]   Date of Service  2020-03-20  HPI/Events of Note  Patient needs SSI regimen with HS coverage.  eICU Interventions  Very sensitive scale SSI AC TID and HS ordered as patient has ESRD>        Marnette Burgess U Nanna Ertle 20-Mar-2020, 11:45 PM

## 2020-02-20 NOTE — ED Notes (Signed)
Lab called, pt is COVID positive per results.

## 2020-02-20 NOTE — ED Triage Notes (Signed)
Pt presents covid + with c/o weakness and dizziness as well as cough and chills. Expiratory wheezing heard by EMS. cbg 307, temp 97.3, HR 104, O2 level 98% on 10L NRB.

## 2020-02-20 NOTE — Progress Notes (Signed)
During initial shift assessment, patient transferred from bed to chair and remained there for approximately 45 min. Patient needs larger bariatric chair to sit up comfortably, but none available tonight. Will ask for this to be provided for patient. Transferred back to bed and provided sandwich and heated meal for patient, but he stated his appetite is not good. Provided ice water and Ensure breeze drink, which he was able to finish. Encouraged patient to stay hydrated even if his appetite is poor. Patient did have moderate difficulty with transfers due to lethargy and entanglement with cords. Patient advised this puts him at risk to fall so he should not attempt getting out of bed without assistance. Also provided urinal during this time which was difficult for patient to use. Patient able to void however, unable to hit urinal. Reported elevated blood sugar to [facility] providers. They will order insulin. Patient's daughter arrived to unit to talk to Clinical research associate about patient condition. Patient had previously listed daughter Helmut Muster as primary emergency contact. Updated her and reinforced visitation policy. Continuing to provide for patient needs and monitor condition.

## 2020-02-21 ENCOUNTER — Inpatient Hospital Stay (HOSPITAL_COMMUNITY): Payer: Medicare Other

## 2020-02-21 ENCOUNTER — Encounter (HOSPITAL_COMMUNITY): Payer: Self-pay | Admitting: Pulmonary Disease

## 2020-02-21 DIAGNOSIS — J189 Pneumonia, unspecified organism: Secondary | ICD-10-CM

## 2020-02-21 LAB — COMPREHENSIVE METABOLIC PANEL
ALT: 51 U/L — ABNORMAL HIGH (ref 0–44)
AST: 229 U/L — ABNORMAL HIGH (ref 15–41)
Albumin: 2.7 g/dL — ABNORMAL LOW (ref 3.5–5.0)
Alkaline Phosphatase: 39 U/L (ref 38–126)
Anion gap: 15 (ref 5–15)
BUN: 94 mg/dL — ABNORMAL HIGH (ref 6–20)
CO2: 23 mmol/L (ref 22–32)
Calcium: 8 mg/dL — ABNORMAL LOW (ref 8.9–10.3)
Chloride: 87 mmol/L — ABNORMAL LOW (ref 98–111)
Creatinine, Ser: 7.67 mg/dL — ABNORMAL HIGH (ref 0.61–1.24)
GFR calc Af Amer: 8 mL/min — ABNORMAL LOW (ref >60)
GFR calc non Af Amer: 7 mL/min — ABNORMAL LOW (ref >60)
Glucose, Bld: 514 mg/dL (ref 70–99)
Potassium: 5.6 mmol/L — ABNORMAL HIGH (ref 3.5–5.1)
Sodium: 125 mmol/L — ABNORMAL LOW (ref 135–145)
Total Bilirubin: 1.1 mg/dL (ref 0.3–1.2)
Total Protein: 6.9 g/dL (ref 6.5–8.1)

## 2020-02-21 LAB — MAGNESIUM: Magnesium: 2.6 mg/dL — ABNORMAL HIGH (ref 1.7–2.4)

## 2020-02-21 LAB — BASIC METABOLIC PANEL
Anion gap: 16 — ABNORMAL HIGH (ref 5–15)
BUN: 99 mg/dL — ABNORMAL HIGH (ref 6–20)
CO2: 24 mmol/L (ref 22–32)
Calcium: 8.3 mg/dL — ABNORMAL LOW (ref 8.9–10.3)
Chloride: 88 mmol/L — ABNORMAL LOW (ref 98–111)
Creatinine, Ser: 8.03 mg/dL — ABNORMAL HIGH (ref 0.61–1.24)
GFR calc Af Amer: 8 mL/min — ABNORMAL LOW (ref >60)
GFR calc non Af Amer: 7 mL/min — ABNORMAL LOW (ref >60)
Glucose, Bld: 246 mg/dL — ABNORMAL HIGH (ref 70–99)
Potassium: 5.9 mmol/L — ABNORMAL HIGH (ref 3.5–5.1)
Sodium: 128 mmol/L — ABNORMAL LOW (ref 135–145)

## 2020-02-21 LAB — GLUCOSE, CAPILLARY
Glucose-Capillary: 461 mg/dL — ABNORMAL HIGH (ref 70–99)
Glucose-Capillary: 495 mg/dL — ABNORMAL HIGH (ref 70–99)
Glucose-Capillary: 457 mg/dL — ABNORMAL HIGH (ref 70–99)
Glucose-Capillary: 469 mg/dL — ABNORMAL HIGH (ref 70–99)
Glucose-Capillary: 424 mg/dL — ABNORMAL HIGH (ref 70–99)
Glucose-Capillary: 246 mg/dL — ABNORMAL HIGH (ref 70–99)
Glucose-Capillary: 239 mg/dL — ABNORMAL HIGH (ref 70–99)
Glucose-Capillary: 229 mg/dL — ABNORMAL HIGH (ref 70–99)
Glucose-Capillary: 223 mg/dL — ABNORMAL HIGH (ref 70–99)
Glucose-Capillary: 211 mg/dL — ABNORMAL HIGH (ref 70–99)
Glucose-Capillary: 278 mg/dL — ABNORMAL HIGH (ref 70–99)
Glucose-Capillary: 364 mg/dL — ABNORMAL HIGH (ref 70–99)
Glucose-Capillary: 407 mg/dL — ABNORMAL HIGH (ref 70–99)

## 2020-02-21 LAB — CBC WITH DIFFERENTIAL/PLATELET
Abs Immature Granulocytes: 0.05 10*3/uL (ref 0.00–0.07)
Basophils Absolute: 0 10*3/uL (ref 0.0–0.1)
Basophils Relative: 0 %
Eosinophils Absolute: 0 10*3/uL (ref 0.0–0.5)
Eosinophils Relative: 0 %
HCT: 41.9 % (ref 39.0–52.0)
Hemoglobin: 13.1 g/dL (ref 13.0–17.0)
Immature Granulocytes: 1 %
Lymphocytes Relative: 6 %
Lymphs Abs: 0.5 10*3/uL — ABNORMAL LOW (ref 0.7–4.0)
MCH: 30.4 pg (ref 26.0–34.0)
MCHC: 31.3 g/dL (ref 30.0–36.0)
MCV: 97.2 fL (ref 80.0–100.0)
Monocytes Absolute: 0.5 10*3/uL (ref 0.1–1.0)
Monocytes Relative: 6 %
Neutro Abs: 6.6 10*3/uL (ref 1.7–7.7)
Neutrophils Relative %: 87 %
Platelets: 129 10*3/uL — ABNORMAL LOW (ref 150–400)
RBC: 4.31 MIL/uL (ref 4.22–5.81)
RDW: 12.4 % (ref 11.5–15.5)
WBC: 7.6 10*3/uL (ref 4.0–10.5)
nRBC: 0.4 % — ABNORMAL HIGH (ref 0.0–0.2)

## 2020-02-21 LAB — CREATININE, URINE, RANDOM: Creatinine, Urine: 164.27 mg/dL

## 2020-02-21 LAB — HEMOGLOBIN A1C
Hgb A1c MFr Bld: 8.8 % — ABNORMAL HIGH (ref 4.8–5.6)
Mean Plasma Glucose: 205.86 mg/dL

## 2020-02-21 LAB — C-REACTIVE PROTEIN: CRP: 21.6 mg/dL — ABNORMAL HIGH (ref <1.0)

## 2020-02-21 LAB — HIV ANTIBODY (ROUTINE TESTING W REFLEX): HIV Screen 4th Generation wRfx: NONREACTIVE

## 2020-02-21 LAB — FERRITIN: Ferritin: 9125 ng/mL — ABNORMAL HIGH (ref 24–336)

## 2020-02-21 LAB — PHOSPHORUS: Phosphorus: 6.8 mg/dL — ABNORMAL HIGH (ref 2.5–4.6)

## 2020-02-21 LAB — STREP PNEUMONIAE URINARY ANTIGEN: Strep Pneumo Urinary Antigen: NEGATIVE

## 2020-02-21 LAB — SODIUM, URINE, RANDOM: Sodium, Ur: 38 mmol/L

## 2020-02-21 LAB — D-DIMER, QUANTITATIVE: D-Dimer, Quant: 3 ug/mL-FEU — ABNORMAL HIGH (ref 0.00–0.50)

## 2020-02-21 MED ORDER — INSULIN ASPART 100 UNIT/ML ~~LOC~~ SOLN
0.0000 [IU] | SUBCUTANEOUS | Status: DC
  Administered 2020-02-21 (×2): 9 [IU] via SUBCUTANEOUS
  Administered 2020-02-21: 5 [IU] via SUBCUTANEOUS
  Administered 2020-02-22 (×2): 9 [IU] via SUBCUTANEOUS
  Administered 2020-02-22: 5 [IU] via SUBCUTANEOUS

## 2020-02-21 MED ORDER — DEXTROSE 50 % IV SOLN: 0.0000 mL | INTRAVENOUS | Status: DC | PRN

## 2020-02-21 MED ORDER — SODIUM CHLORIDE 0.9 % IV BOLUS
1500.0000 mL | Freq: Once | INTRAVENOUS | Status: AC
  Administered 2020-02-21: 1500 mL via INTRAVENOUS

## 2020-02-21 MED ORDER — ONDANSETRON HCL 4 MG/2ML IJ SOLN
4.0000 mg | Freq: Four times a day (QID) | INTRAMUSCULAR | Status: DC | PRN
  Administered 2020-02-21: 4 mg via INTRAVENOUS
  Filled 2020-02-21 (×2): qty 2

## 2020-02-21 MED ORDER — INSULIN REGULAR(HUMAN) IN NACL 100-0.9 UT/100ML-% IV SOLN
INTRAVENOUS | Status: DC
  Administered 2020-02-21: 6.5 [IU]/h via INTRAVENOUS
  Filled 2020-02-21: qty 100

## 2020-02-21 MED ORDER — FENTANYL CITRATE (PF) 100 MCG/2ML IJ SOLN: 50.0000 ug | INTRAMUSCULAR | Status: DC | PRN

## 2020-02-21 MED ORDER — DEXTROSE-NACL 5-0.9 % IV SOLN: INTRAVENOUS | Status: DC

## 2020-02-21 MED ORDER — DEXTROSE IN LACTATED RINGERS 5 % IV SOLN: INTRAVENOUS | Status: DC

## 2020-02-21 NOTE — Progress Notes (Signed)
CRITICAL VALUE ALERT  Critical Value:  Serum glucose 514  Date & Time Notied:  02-21-20 @ 0134  Provider Notified: Dr. Warrick Parisian  Orders Received/Actions taken: Insulin drip

## 2020-02-21 NOTE — Progress Notes (Signed)
eLink Physician-Brief Progress Note Patient Name: [name] DOB: 05-12-1966 MRN: [id number]   Date of Service  02/21/2020  HPI/Events of Note  Blood sugar 514 mg %  eICU Interventions  Insulin infusion ordered.        [name] 02/21/2020, 1:39 AM

## 2020-02-21 NOTE — Progress Notes (Signed)
Insulin infusion started 0211. Dosing via endotool.

## 2020-02-21 NOTE — Progress Notes (Addendum)
NAME:  [name], MRN:  [id number], DOB:  04/05/1966, LOS: 1 ADMISSION DATE:  02/16/2020, CONSULTATION DATE: 9/23 REFERRING MD: Dr. Criss Alvine, CHIEF COMPLAINT: COVID-19 pneumonia  Brief History   54 yo male developed URI symptoms on 9/20 associated with nausea and vomiting.  He presented to the ER with dyspnea.  He has not been vaccinated for COVID.  Found to have hypotension, AKI, hypoxia, lactic acidosis, elevated LFT.  Found to have COVID 19 pneumonia and elevated procalcitonin concerning for bacterial superinfection.  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  Past Medical History  Asthma, CAD, Depression, Chronic pain, pre-DM, GI bleeding, Headache, HTN, Sarcoidosis, Chorioretinal inflammation  Significant Hospital Events   9/23 Admit 9/24 start insulin gtt  Consults:    Procedures:    Significant Diagnostic Tests:   Abdominal ultrasound 9/24 >>   Micro Data:  COVID 9/23 >> positive MRSA Screen 9/23 >> negative Legionella Ag 9/24 >> Pneumococcal Ag 9/24 >> Blood 9/23 >> HIV 9/23 >>   Antimicrobials/COVID therapy:  Zyvox 9/23 >> 9/24 Cefepime 9/23 >>  Solumedrol 9/23 >>    Interim history/subjective:  Confused this morning.  Objective   Blood pressure (!) 102/42, pulse 87, temperature (!) 96.9 F (36.1 C), temperature source Axillary, resp. rate (!) 32, height 5\' 10"  (1.778 m), weight (!) 223.6 kg, SpO2 (!) 88 %.    FiO2 (%):  [100 %] 100 %   Intake/Output Summary (Last 24 hours) at 02/21/2020 0847 Last data filed at 02/21/2020 02/23/2020 Gross per 24 hour  Intake 1808.07 ml  Output 600 ml  Net 1208.07 ml   Filed Weights   02/19/2020 1900 02/21/20 0605  Weight: (!) 226.8 kg (!) 223.6 kg    Examination:  General - alert Eyes - pupils reactive ENT - no sinus tenderness, no stridor Cardiac - regular rate/rhythm, no murmur Chest - diminished breath sounds, scattered rhonchi Abdomen - soft, non tender, + bowel  sounds Extremities - 1+ non pitting edema Skin - venous stasis changes Neuro - follows simple commands, moves extremities, doesn't recall what has happened since he was admitted   Resolved Hospital Problem list     Assessment & Plan:   Acute hypoxic respiratory failure 2nd to COVID 19 pneumonia. - defer remdesivir in setting of elevated LFTs - defer baricitinib in setting of positive procalcitonin and possibility of bacterial infection - day 2 of solumedrol - goal SpO2 85 to 95% - f/u CXR intermittently - monitor need for ETT  Bacterial pneumonia superinfection. - MRSA pcr negative >> d/c zyvox - day 2 of cefepime  Hx of asthma. - continue singulair - prn albuterol  Acute renal failure from volume depletion. Anion gap metabolic acidosis with lactic acidosis. Hyperkalemia from acidosis. Hyponatremia in setting of hyperglycemia. - f/u BMET - check renal ultrasound - change IV fluid to D5NS at 100 ml/hr - monitor urine outpt - check FeNa - might need nephrology assessment if renal fx doesn't improve >> no acute indications for renal replacement at this time  Severe sepsis with lactic acidosis. - continue IV fluids  Elevated troponin from demand ischemia. Hx of CAD, HTN, HLD. - hold outpt toprol, lasix, cozaar - continue ASA, zocor  Elevated LFTs. - f/u LFTs intermittently - check abdominal u/s  Hx of peripheral focal chorioretinal inflammation. - followed by ophthalmology at [facility] by Dr. [facility] - hold outpt imuran  Morbid obesity with BMI 67.44 - needs to work on weight loss  Acute metabolic encephalopathy from  renal failure, hypoxia, sepsis. Hx of chronic pain. - hold outpt adderall, buprenorphine, oxycodone, tramadol  DM type 2 poorly controlled with steroid induced hyperglycemia. - HbA1C 8.8 from 9/24 - transitioned to insulin gtt on 9/24   Best practice:  Diet: carb modified, heart healthy DVT prophylaxis: SQ heparin GI prophylaxis:  protonix Mobility: OOB to chair as able Code Status: full code Disposition: ICU  Labs    CMP Latest Ref Rng & Units 02/21/2020 02/04/2020 06/05/2017  Glucose 70 - 99 mg/dL 132(GM) 010(U) 94  BUN 6 - 20 mg/dL 72(Z) 36(U) 14  Creatinine 0.61 - 1.24 mg/dL 4.40(H) 4.74(Q) 5.95  Sodium 135 - 145 mmol/L 125(L) 130(L) 136  Potassium 3.5 - 5.1 mmol/L 5.6(H) 5.3(H) 4.7  Chloride 98 - 111 mmol/L 87(L) 87(L) 97  CO2 22 - 32 mmol/L 23 20(L) 30  Calcium 8.9 - 10.3 mg/dL 8.0(L) 9.0 9.0  Total Protein 6.5 - 8.1 g/dL 6.9 7.5 7.5  Total Bilirubin 0.3 - 1.2 mg/dL 1.1 1.1 0.4  Alkaline Phos 38 - 126 U/L 39 41 63  AST 15 - 41 U/L 229(H) 241(H) 10  ALT 0 - 44 U/L 51(H) 50(H) 7    CBC Latest Ref Rng & Units 02/21/2020 01/31/2020 06/05/2017  WBC 4.0 - 10.5 K/uL 7.6 9.9 7.9  Hemoglobin 13.0 - 17.0 g/dL 63.8 75.6 43.3  Hematocrit 39 - 52 % 41.9 45.9 41.6  Platelets 150 - 400 K/uL 129(L) 136(L) 188.0    ABG    Component Value Date/Time   PHART 7.326 (L) 02/22/2020 1315   PCO2ART 31.2 (L) 02/12/2020 1315   PO2ART 191 (H) 02/23/2020 1315   HCO3 15.8 (L) 02/08/2020 1315   TCO2 31 08/23/2009 2150   ACIDBASEDEF 8.6 (H) 02/13/2020 1315   O2SAT 99.3 02/03/2020 1315    CBG (last 3)  Recent Labs    02/21/20 0609 02/21/20 0706 02/21/20 0805  GLUCAP 239* 229* 223*    Critical care time: 34 minutes  Coralyn Helling, MD [facility] Pulmonary/Critical Care Pager - [phone] 02/21/2020, 8:47 AM

## 2020-02-21 NOTE — Progress Notes (Signed)
Reviewed BMET results.  K up, and creatinine higher also.  Consult with nephrology requested.  Coralyn Helling, MD [facility] Pulmonary/Critical Care Pager - [phone] 02/21/2020, 3:07 PM

## 2020-02-21 NOTE — Consult Note (Addendum)
Renal Service Consult Note [facility]  [name] 02/21/2020 Maree Krabbe Requesting Physician:  Dr Craige Cotta  Reason for Consult: Renal failure HPI: The patient is a 54 y.o. year-old w/ hx of sarcoidosis, HTN, GIB, depressoin, CAD who presented on 9/23 w/ gen weakness, dizzy, coughing, chills, wheezing for about 3-4 days getting worse.  SOB. Was not vaccinated for COVID -19.  In ED pt was hypoxic on 10L [place], BP's were less than 90. BP didn't improve w/ fliud bolus. Creat on admit was 7.6 and today is up to 8.  Asked to see for renal failure.    Last creat here was 0.95 in 2019.  No more recent labs found. Pt denies hx of kidney failure.  Denies any recent n/v/d. No abd pain. No vomiting today no confusion or jerking or arms.   ROS  No CP  no rash or bruising  no abd pain  no voiding difficulty   Past Medical History  Past Medical History:  Diagnosis Date  . Asthma   . CAD (coronary artery disease)    nonobstructive -- with LVH but preserved EF  . Depression   . DJD (degenerative joint disease)   . GI bleed   . Glucose intolerance (impaired glucose tolerance)   . Headache(784.0)   . Helicobacter pylori gastritis   . HTN (hypertension)   . Low back pain   . Morbid obesity (HCC)   . Sarcoidosis   . Uveitis    Past Surgical History  Past Surgical History:  Procedure Laterality Date  . CATARACT EXTRACTION     Family History  Family History  Problem Relation Age of Onset  . Heart disease Mother   . Diabetes Brother        x2  . Esophageal cancer Father   . Colon cancer Neg Hx   . Colon polyps Neg Hx   . Kidney disease Neg Hx    Social History  reports that he has never smoked. He has never used smokeless tobacco. He reports that he does not drink alcohol and does not use drugs. Allergies  Allergies  Allergen Reactions  . Codeine    Home medications Prior to Admission medications   Medication Sig Start Date End Date Taking? Authorizing  Provider  albuterol (VENTOLIN HFA) 108 (90 Base) MCG/ACT inhaler Inhale 1-2 puffs into the lungs every 4 (four) hours as needed for shortness of breath. 01/29/20   [provider]  amphetamine-dextroamphetamine (ADDERALL) 10 MG tablet Take 10 mg by mouth 3 (three) times daily as needed. 12/03/19   [provider]  aspirin 81 MG tablet Take 81 mg by mouth daily. Reported on 10/07/2015    [provider]  azaTHIOprine (IMURAN) 50 MG tablet SMARTSIG:Tablet(s) By Mouth 01/07/20   [provider]  buprenorphine (BUTRANS) 5 MCG/HR PTWK Place 1 patch onto the skin once a week. 01/30/20   [provider]  famotidine (PEPCID) 20 MG tablet Take 20 mg by mouth daily. 01/30/20   [provider]  furosemide (LASIX) 40 MG tablet Take 1 tablet (40 mg total) by mouth daily. 12/04/17   Gordy Savers, MD  losartan (COZAAR) 50 MG tablet Take 50 mg by mouth daily. 02/12/20   [provider]  Meclizine HCl 25 MG CHEW Chew 1 tablet (25 mg total) by mouth 4 (four) times daily as needed. 12/29/16   Gordy Savers, MD  metoprolol succinate (TOPROL-XL) 50 MG 24 hr tablet Take 1 tablet (50 mg  total) by mouth daily. Take with or immediately following a meal. 12/04/17   Gordy Savers, MD  montelukast (SINGULAIR) 10 MG tablet Take 10 mg by mouth at bedtime. 01/27/20   [provider]  Oxycodone HCl 20 MG TABS Take 1 tablet by mouth every 4 (four) hours as needed for severe pain. 01/29/20   [provider]  pantoprazole (PROTONIX) 40 MG tablet TAKE 1 TABLET BY MOUTH EVERY DAY 02/28/18   Roderick Pee, MD  predniSONE (DELTASONE) 10 MG tablet Take 20 mg by mouth daily. 01/14/20   [provider]  simvastatin (ZOCOR) 40 MG tablet Take 40 mg by mouth at bedtime. 01/24/20   [provider]  traMADol (ULTRAM) 50 MG tablet Take 1 tablet (50 mg total) by mouth every 6 (six) hours as needed. 12/04/17   Gordy Savers, MD  lisinopril  (PRINIVIL,ZESTRIL) 20 MG tablet Take 1 tablet (20 mg total) by mouth daily. 07/17/14 08/27/14  Gordy Savers, MD     Vitals:   02/21/20 6195 02/21/20 0822 02/21/20 1048 02/21/20 1049  BP:    (!) 94/52  Pulse: 86 87  84  Resp: (!) 44 (!) 32  (!) 28  Temp:   (!) 96.4 F (35.8 C)   TempSrc:      SpO2: (!) 87% (!) 88%  90%  Weight:      Height:       Exam Gen marked obesity, sitting up in a chair, alert No rash, cyanosis or gangrene Sclera anicteric, throat clear  No jvd or bruits Chest clear bilat to bases, no rales or wheezing RRR no MRG Abd soft ntnd no mass or ascites +bs obese GU normal male  MS no joint effusions or deformity Ext no leg or UE edema, no wounds or ulcers Neuro is alert, Ox 3 , nf, on asterixis    Home meds:  - asa 81/ lasix 40 qd/ losartan 50 qd/ metoprolol xl 50 qd/ zocor 40 hs  - ultram 50 qid prn/ oxycodone 20 qid prn/ buprenorphine 1 patch weekly/ adderall 10 tid prn  - imuran 50 mg / pred 10 qd  - meclizine 25 qid prn  - singulair 10 hs/ protonix 40 qd  - prn's/ vitamins/ supplements     UNa 38,  UCr 164   UA pending     BP's labile 60's, 90's, 116/54   No pressors   FiO2 15 L HFNC, SpO2 88%     Na 128  K 5.9  CO2 24  BUN 99  Cr 8.03    Ca 8.3  Alb 2.7  Tbili 1.1    BNP 65    WBC 7K  Hb 13  plt 129       IVF= D5 NS at 100 / hr     CXR 9/24 - IMPRESSION: 1. Low lung volumes. Persistent bibasilar infiltrates without interim change. Small bilateral pleural effusions cannot be excluded. 2.  Stable cardiomegaly.  No pulmonary venous congestion  Assessment/ Plan: 1. Renal failure - creat in 2019 was normal. Presumably this is AKI from COVID infection related to tissue damage vs hypotension/ ischemic ATN.  Making some urine. Looks dry on exam.  Creat 7 yest > 8 today, not overtly uremic. Will rebolus 1.5 L and recommend to cont IVF"s, hopefully renal function will improve. If not will likely get worse and need CRRT at some point soon. Avoid vanc  IV, nsaids/ contrast.  Will follow.  2. COVID PNA - bibasilar  atx per CXR, on 10 L HFNC.  3. H/o HTN - on arb/ BB at home, holding both, BP's soft 4. Sarcoidosis - imuran/ pred at home 5. Morbid obesity      Vinson Moselle  MD 02/21/2020, 3:49 PM  Recent Labs  Lab 02/26/2020 1309 02/21/20 0043  WBC 9.9 7.6  HGB 14.3 13.1   Recent Labs  Lab 02/21/20 0043 02/21/20 1036  K 5.6* 5.9*  BUN 94* 99*  CREATININE 7.67* 8.03*  CALCIUM 8.0* 8.3*  PHOS 6.8*  --

## 2020-02-21 NOTE — Progress Notes (Signed)
eLink Physician-Brief Progress Note Patient Name: [name] DOB: Oct 16, 1965 MRN: [id number]   Date of Service  02/21/2020  HPI/Events of Note  Patient with a bout of nausea, no abdominal pain.  eICU Interventions  Zofran  4 mg  Iv Q 6 hours PRN nausea x 4 doses ordered.        Migdalia Dk 02/21/2020, 11:31 PM

## 2020-02-22 ENCOUNTER — Inpatient Hospital Stay (HOSPITAL_COMMUNITY): Payer: Medicare Other

## 2020-02-22 LAB — URINALYSIS, MICROSCOPIC (REFLEX): WBC, UA: NONE SEEN WBC/hpf (ref 0–5)

## 2020-02-22 LAB — COMPREHENSIVE METABOLIC PANEL
ALT: 53 U/L — ABNORMAL HIGH (ref 0–44)
AST: 170 U/L — ABNORMAL HIGH (ref 15–41)
Albumin: 2.6 g/dL — ABNORMAL LOW (ref 3.5–5.0)
Alkaline Phosphatase: 42 U/L (ref 38–126)
Anion gap: 16 — ABNORMAL HIGH (ref 5–15)
BUN: 119 mg/dL — ABNORMAL HIGH (ref 6–20)
CO2: 24 mmol/L (ref 22–32)
Calcium: 8.2 mg/dL — ABNORMAL LOW (ref 8.9–10.3)
Chloride: 94 mmol/L — ABNORMAL LOW (ref 98–111)
Creatinine, Ser: 7.62 mg/dL — ABNORMAL HIGH (ref 0.61–1.24)
GFR calc Af Amer: 8 mL/min — ABNORMAL LOW (ref >60)
GFR calc non Af Amer: 7 mL/min — ABNORMAL LOW (ref >60)
Glucose, Bld: 317 mg/dL — ABNORMAL HIGH (ref 70–99)
Potassium: 5.8 mmol/L — ABNORMAL HIGH (ref 3.5–5.1)
Sodium: 134 mmol/L — ABNORMAL LOW (ref 135–145)
Total Bilirubin: 1 mg/dL (ref 0.3–1.2)
Total Protein: 6.9 g/dL (ref 6.5–8.1)

## 2020-02-22 LAB — BASIC METABOLIC PANEL
Anion gap: 18 — ABNORMAL HIGH (ref 5–15)
BUN: 110 mg/dL — ABNORMAL HIGH (ref 6–20)
CO2: 20 mmol/L — ABNORMAL LOW (ref 22–32)
Calcium: 8.4 mg/dL — ABNORMAL LOW (ref 8.9–10.3)
Chloride: 91 mmol/L — ABNORMAL LOW (ref 98–111)
Creatinine, Ser: 8.07 mg/dL — ABNORMAL HIGH (ref 0.61–1.24)
GFR calc Af Amer: 8 mL/min — ABNORMAL LOW (ref >60)
GFR calc non Af Amer: 7 mL/min — ABNORMAL LOW (ref >60)
Glucose, Bld: 380 mg/dL — ABNORMAL HIGH (ref 70–99)
Potassium: 5.9 mmol/L — ABNORMAL HIGH (ref 3.5–5.1)
Sodium: 129 mmol/L — ABNORMAL LOW (ref 135–145)

## 2020-02-22 LAB — BLOOD GAS, ARTERIAL
Acid-base deficit: 5.6 mmol/L — ABNORMAL HIGH (ref 0.0–2.0)
Bicarbonate: 21.4 mmol/L (ref 20.0–28.0)
Drawn by: 23281
FIO2: 100
O2 Saturation: 92.2 %
Patient temperature: 98.1
pCO2 arterial: 49.1 mmHg — ABNORMAL HIGH (ref 32.0–48.0)
pH, Arterial: 7.259 — ABNORMAL LOW (ref 7.350–7.450)
pO2, Arterial: 77.9 mmHg — ABNORMAL LOW (ref 83.0–108.0)

## 2020-02-22 LAB — GLUCOSE, CAPILLARY
Glucose-Capillary: 284 mg/dL — ABNORMAL HIGH (ref 70–99)
Glucose-Capillary: 293 mg/dL — ABNORMAL HIGH (ref 70–99)
Glucose-Capillary: 293 mg/dL — ABNORMAL HIGH (ref 70–99)
Glucose-Capillary: 321 mg/dL — ABNORMAL HIGH (ref 70–99)
Glucose-Capillary: 361 mg/dL — ABNORMAL HIGH (ref 70–99)
Glucose-Capillary: 361 mg/dL — ABNORMAL HIGH (ref 70–99)

## 2020-02-22 LAB — CBC WITH DIFFERENTIAL/PLATELET
Abs Immature Granulocytes: 0.07 10*3/uL (ref 0.00–0.07)
Basophils Absolute: 0 10*3/uL (ref 0.0–0.1)
Basophils Relative: 0 %
Eosinophils Absolute: 0 10*3/uL (ref 0.0–0.5)
Eosinophils Relative: 0 %
HCT: 43.3 % (ref 39.0–52.0)
Hemoglobin: 13.5 g/dL (ref 13.0–17.0)
Immature Granulocytes: 1 %
Lymphocytes Relative: 6 %
Lymphs Abs: 0.6 10*3/uL — ABNORMAL LOW (ref 0.7–4.0)
MCH: 30.2 pg (ref 26.0–34.0)
MCHC: 31.2 g/dL (ref 30.0–36.0)
MCV: 96.9 fL (ref 80.0–100.0)
Monocytes Absolute: 0.6 10*3/uL (ref 0.1–1.0)
Monocytes Relative: 6 %
Neutro Abs: 9.2 10*3/uL — ABNORMAL HIGH (ref 1.7–7.7)
Neutrophils Relative %: 87 %
Platelets: 162 10*3/uL (ref 150–400)
RBC: 4.47 MIL/uL (ref 4.22–5.81)
RDW: 12.6 % (ref 11.5–15.5)
WBC: 10.5 10*3/uL (ref 4.0–10.5)
nRBC: 0.9 % — ABNORMAL HIGH (ref 0.0–0.2)

## 2020-02-22 LAB — D-DIMER, QUANTITATIVE: D-Dimer, Quant: 1.77 ug/mL-FEU — ABNORMAL HIGH (ref 0.00–0.50)

## 2020-02-22 LAB — URINALYSIS, ROUTINE W REFLEX MICROSCOPIC
Bilirubin Urine: NEGATIVE
Glucose, UA: >=500 mg/dL — AB
Ketones, ur: NEGATIVE mg/dL
Leukocytes,Ua: NEGATIVE
Nitrite: NEGATIVE
Specific Gravity, Urine: 1.015 (ref 1.005–1.030)
pH: 5.5 (ref 5.0–8.0)

## 2020-02-22 LAB — MAGNESIUM: Magnesium: 2.8 mg/dL — ABNORMAL HIGH (ref 1.7–2.4)

## 2020-02-22 LAB — LACTIC ACID, PLASMA: Lactic Acid, Venous: 1.4 mmol/L (ref 0.5–1.9)

## 2020-02-22 LAB — PHOSPHORUS: Phosphorus: 6.8 mg/dL — ABNORMAL HIGH (ref 2.5–4.6)

## 2020-02-22 LAB — C-REACTIVE PROTEIN: CRP: 15.7 mg/dL — ABNORMAL HIGH (ref <1.0)

## 2020-02-22 MED ORDER — LABETALOL HCL 5 MG/ML IV SOLN
INTRAVENOUS | Status: AC
  Filled 2020-02-22: qty 4

## 2020-02-22 MED ORDER — INSULIN GLARGINE 100 UNIT/ML ~~LOC~~ SOLN
20.0000 [IU] | Freq: Every day | SUBCUTANEOUS | Status: DC
  Administered 2020-02-22: 20 [IU] via SUBCUTANEOUS
  Filled 2020-02-22 (×2): qty 0.2

## 2020-02-22 MED ORDER — INSULIN ASPART 100 UNIT/ML ~~LOC~~ SOLN
0.0000 [IU] | Freq: Every day | SUBCUTANEOUS | Status: DC
  Administered 2020-02-22 – 2020-02-23 (×2): 3 [IU] via SUBCUTANEOUS

## 2020-02-22 MED ORDER — INSULIN ASPART 100 UNIT/ML ~~LOC~~ SOLN
0.0000 [IU] | Freq: Three times a day (TID) | SUBCUTANEOUS | Status: DC
  Administered 2020-02-22: 11 [IU] via SUBCUTANEOUS
  Administered 2020-02-22 – 2020-02-23 (×2): 15 [IU] via SUBCUTANEOUS
  Administered 2020-02-23: 11 [IU] via SUBCUTANEOUS
  Administered 2020-02-23: 15 [IU] via SUBCUTANEOUS
  Administered 2020-02-24: 11 [IU] via SUBCUTANEOUS
  Administered 2020-02-24: 7 [IU] via SUBCUTANEOUS
  Administered 2020-02-24: 11 [IU] via SUBCUTANEOUS
  Administered 2020-02-25 (×2): 4 [IU] via SUBCUTANEOUS
  Administered 2020-02-25: 3 [IU] via SUBCUTANEOUS

## 2020-02-22 MED ORDER — LABETALOL HCL 5 MG/ML IV SOLN: 10.0000 mg | INTRAVENOUS | Status: DC | PRN

## 2020-02-22 MED ORDER — LABETALOL HCL 5 MG/ML IV SOLN: 20.0000 mg | INTRAVENOUS | Status: DC | PRN

## 2020-02-22 MED ORDER — SODIUM CHLORIDE 0.9 % IV SOLN
INTRAVENOUS | Status: DC
  Administered 2020-02-26: 50 mL/h via INTRAVENOUS

## 2020-02-22 NOTE — Progress Notes (Signed)
Patient frequently pulling off oxygen, telemetry leads, and disoriented to place and situation. Attempts to reorient patient have not been effective. At this time, patient not trying to get out of bed or pull at IV tubing. Nursing trying to maintain HFNC with NRB which is effective to meet oxygen saturation goals, but patient is restless, not seeming to understand importance of oxygen devices or other monitoring equipment.

## 2020-02-22 NOTE — Progress Notes (Signed)
Spoke with pts daughter updated her on pts increased oxygen demands.

## 2020-02-22 NOTE — Progress Notes (Signed)
[facility] Progress Note  Subjective:  Patient not examined today directly given COVID-19 + status, utilizing data taken from chart +/- discussions w/ providers and staff.   UOP good yest and today, creat stable around 8. Oxygenation worsening.   Vitals:   02/22/20 1116 02/22/20 1200 02/22/20 1300 02/22/20 1600  BP:  (!) 137/48 (!) 131/41   Pulse:  95 89   Resp:  (!) 26 (!) 34   Temp:    (!) 97.4 F (36.3 C)  TempSrc:    Axillary  SpO2: (!) 73% (!) 82% (!) 82%   Weight:      Height:        Exam:  Patient not examined today directly given COVID-19 + status, utilizing data taken from chart +/- discussions w/ providers and staff.     Home meds:  - asa 81/ lasix 40 qd/ losartan 50 qd/ metoprolol xl 50 qd/ zocor 40 hs  - ultram 50 qid prn/ oxycodone 20 qid prn/ buprenorphine 1 patch weekly/ adderall 10 tid prn  - imuran 50 mg / pred 10 qd  - meclizine 25 qid prn  - singulair 10 hs/ protonix 40 qd  - prn's/ vitamins/ supplements     UNa 38,  UCr 164   UA pending   Abd [place] - 11 cm kidneys w/o hydro or stones     CXR 9/24 - IMPRESSION: 1. Low lung volumes. Persistent bibasilar infiltrates without interim change. Small bilateral pleural effusions cannot be excluded. 2. Stable cardiomegaly. No pulmonary venous congestion  Assessment/ Plan: 1. Renal failure - creat in 2019 was normal. Presumably this is AKI from COVID infection and/or hypotension/ ischemic ATN.  Making urine, getting IVF's. US shows normal appearing kidneys. Creat stable around 8, no strong indication for RRT at this time. Avoid vanc IV, nsaids/ contrast.  Will follow.  2. COVID PNA - bibasilar atx per CXR, on 10 L HFNC.  3. H/o HTN - on arb/ BB at home, holding both, BP's soft 4. Sarcoidosis - on imuran/ pred at home 5. Morbid obesity        Rob Yanely Mast 02/22/2020, 4:40 PM   Recent Labs  Lab 02/21/20 0043 02/21/20 1036 02/21/20 2203 02/22/20 0656  K 5.6*   < > 5.9* 5.8*  BUN  94*   < > 110* 119*  CREATININE 7.67*   < > 8.07* 7.62*  CALCIUM 8.0*   < > 8.4* 8.2*  PHOS 6.8*  --   --  6.8*  HGB 13.1  --   --  13.5   < > = values in this interval not displayed.   Inpatient medications: . aspirin EC  81 mg Oral Daily  . Chlorhexidine Gluconate Cloth  6 each Topical Daily  . heparin  5,000 Units Subcutaneous Q8H  . insulin aspart  0-20 Units Subcutaneous TID WC  . insulin aspart  0-5 Units Subcutaneous QHS  . insulin glargine  20 Units Subcutaneous Daily  . mouth rinse  15 mL Mouth Rinse BID  . methylPREDNISolone (SOLU-MEDROL) injection  60 mg Intravenous Q8H  . montelukast  10 mg Oral QHS  . pantoprazole  40 mg Oral Daily  . simvastatin  40 mg Oral QHS  . sodium chloride flush  3 mL Intravenous Q12H   . sodium chloride 100 mL/hr at 02/22/20 1450  . ceFEPime (MAXIPIME) IV Stopped (02/22/20 1105)  . sodium chloride     fentaNYL (SUBLIMAZE) injection, ondansetron (ZOFRAN) IV

## 2020-02-22 NOTE — Progress Notes (Signed)
eLink Physician-Brief Progress Note Patient Name: KENNA KIRN DOB: [birth year] MRN: [id number]   Date of Service  02/22/2020  HPI/Events of Note  Informed by radiology of widened mediastinum on CXR. Recommend CTA. No hemodynamic changes and no complaint of chest pain. Creatinine of 7. Patient is fluid positive 1.2 liters and continues to be on NS 100/hr. ABG 7.26/49/78  eICU Interventions  Hold NS for now Repeat BMP and CBC to include BNP If BNP elevated plan to diurese Informed bedside CCM team as concern for patient not being able to tolerated going down to CT due to respiratory compromise. They will discuss further with radiology regarding findings     Intervention Category Major Interventions: Respiratory failure - evaluation and management Intermediate Interventions: Diagnostic test evaluation  [name] 02/22/2020, 10:44 PM

## 2020-02-22 NOTE — Progress Notes (Signed)
eLink Physician-Brief Progress Note Patient Name: NIZAR CUTLER DOB: [birth year] MRN: [id number]   Date of Service  02/22/2020  HPI/Events of Note  Patient's PO intake is inadequate but he is hyperglycemic. He is also hyponatremic.  eICU Interventions  NS 50 ml  / hour ordered as maintenance iv fluids x 24 hours.        Thomasene Lot Mauriah Mcmillen 02/22/2020, 12:59 AM

## 2020-02-22 NOTE — Progress Notes (Signed)
PCCM:  I called and reviewed CXR series with Dr. Chilton Si from radiology.   I discussed clinical status with bedside nurse. The patient has not complained of chest pain and is hemodynamically unchanged.   Reviewed of the cxr images cannot exclude something going on in his mediastinum but it would no fit clinically for the patient to have an acute anterior thoracic dissection and be asymptomatic. The only way to safely get a CTA would be to intubate the patient. And from a COVID19 PNA standpoint I think intubation is not currently warranted.   Therefore, we will repeat the CXR. Check BP in both RUE and LUE. STAT ECHO to evaluate aorta. If CXR is worse, severe UE BP descrepancy, change in hemodynamics, or echo results conclusive we will proceed with intubation and CTA for further evaluation   I discussed case with Dr. Su Hoff from e-link who will follow up on these studies.   Josephine Igo, DO  Pulmonary Critical Care 02/22/2020 11:08 PM

## 2020-02-22 NOTE — Progress Notes (Signed)
NAME:  [name], MRN:  [id number], DOB:  12-24-1965, LOS: 2 ADMISSION DATE:  Mar 20, 2020, CONSULTATION DATE: 9/23 REFERRING MD: Dr. Criss Alvine, CHIEF COMPLAINT: COVID-19 pneumonia  Brief History   54 yo male developed URI symptoms on 9/20 associated with nausea and vomiting.  He presented to the ER with dyspnea.  He has not been vaccinated for COVID.  Found to have hypotension, AKI, hypoxia, lactic acidosis, elevated LFT.  Found to have COVID 19 pneumonia and elevated procalcitonin concerning for bacterial superinfection.  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  Past Medical History  Asthma, CAD, Depression, Chronic pain, pre-DM, GI bleeding, Headache, HTN, Sarcoidosis, Chorioretinal inflammation  Significant Hospital Events   9/23 Admit 9/24 start insulin gtt  Consults:  Nephrology  Procedures:    Significant Diagnostic Tests:   Abdominal ultrasound 9/24 >> normal renal size, no hydronephrosis, no gallstones  Micro Data:  COVID 9/23 >> positive MRSA Screen 9/23 >> negative Legionella Ag 9/24 >> Pneumococcal Ag 9/24 >> negative Blood 9/23 >> HIV 9/23 >> non reactive  Antimicrobials/COVID therapy:  Zyvox 9/23 >> 9/24 Cefepime 9/23 >>  Solumedrol 9/23 >>    Interim history/subjective:  Urine outpt improved.  Confused overnight.  Objective   Blood pressure (!) 119/104, pulse 84, temperature (!) 96.7 F (35.9 C), temperature source Axillary, resp. rate (!) 34, height 5\' 10"  (1.778 m), weight (!) 228.6 kg, SpO2 100 %.    FiO2 (%):  [100 %] 100 %   Intake/Output Summary (Last 24 hours) at 02/22/2020 0958 Last data filed at 02/22/2020 0700 Gross per 24 hour  Intake 1735.22 ml  Output 1660 ml  Net 75.22 ml   Filed Weights   2020-03-20 1900 02/21/20 0605 02/22/20 0600  Weight: (!) 226.8 kg (!) 223.6 kg (!) 228.6 kg    Examination:  General - alert Eyes - pupils reactive ENT - no sinus tenderness, no stridor Cardiac -  regular rate/rhythm, no murmur Chest - scattered rhonchi, decreased breath sounds Abdomen - soft, non tender, + bowel sounds Extremities - 2+ non pitting edema Skin - venous stasis changes Neuro - mild confusion, moves extremities, follows simple commands  Resolved Hospital Problem list   Severe sepsis, Lactic acidosis  Assessment & Plan:   Acute hypoxic respiratory failure 2nd to COVID 19 pneumonia. - defer remdesivir in setting of elevated LFTs - defer baricitinib in setting of positive procalcitonin and possibility of bacterial infection - day 3 of solumedrol - goal SpO2 85 to 95% - f/u CXR intermittently - mobilize as able  Bacterial pneumonia superinfection. - MRSA pcr negative >> d/c zyvox - day 3 of cefepime  Hx of asthma. - continue singulair - prn albuterol  Acute renal failure from volume depletion. Anion gap metabolic acidosis with lactic acidosis. Hyperkalemia from acidosis. Hyponatremia in setting of hyperglycemia. - nephrology consulted 9/24 - continue IV fluids - f/u BMET, monitor urine outpt - has some improvement 9/25 - hopefully can avoid renal replacement therapy  Elevated troponin from demand ischemia. Hx of CAD, HTN, HLD. - hold outpt toprol, lasix, cozaar - continue ASA, zocor  Elevated LFTs. - abdominal ultrasound unrevealing - f/u LFTs intermittently  Hx of peripheral focal chorioretinal inflammation. - followed by ophthalmology at [facility] by Dr. [facility] - hold outpt imuran  Morbid obesity with BMI 67.44 - needs to work on weight loss  Acute metabolic encephalopathy from renal failure, hypoxia, sepsis. Hx of chronic pain. - hold outpt adderall, buprenorphine, oxycodone, tramadol - prn fentanyl  DM type 2 poorly controlled with steroid induced hyperglycemia. - HbA1C 8.8 from 9/24 - change SSI to resistant scale - add lantus 20 units daily  Deconditioning. - PT/OT  Best practice:  Diet: carb modified, heart healthy DVT  prophylaxis: SQ heparin GI prophylaxis: protonix Mobility: OOB to chair as able Code Status: full code Disposition: ICU  Labs    CMP Latest Ref Rng & Units 02/22/2020 02/21/2020 02/21/2020  Glucose 70 - 99 mg/dL 700(F) 749(S) 496(P)  BUN 6 - 20 mg/dL 591(M) 384(Y) 65(L)  Creatinine 0.61 - 1.24 mg/dL 9.35(T) 0.17(B) 9.39(Q)  Sodium 135 - 145 mmol/L 134(L) 129(L) 128(L)  Potassium 3.5 - 5.1 mmol/L 5.8(H) 5.9(H) 5.9(H)  Chloride 98 - 111 mmol/L 94(L) 91(L) 88(L)  CO2 22 - 32 mmol/L 24 20(L) 24  Calcium 8.9 - 10.3 mg/dL 8.2(L) 8.4(L) 8.3(L)  Total Protein 6.5 - 8.1 g/dL 6.9 - -  Total Bilirubin 0.3 - 1.2 mg/dL 1.0 - -  Alkaline Phos 38 - 126 U/L 42 - -  AST 15 - 41 U/L 170(H) - -  ALT 0 - 44 U/L 53(H) - -    CBC Latest Ref Rng & Units 02/22/2020 02/21/2020 03-16-2020  WBC 4.0 - 10.5 K/uL 10.5 7.6 9.9  Hemoglobin 13.0 - 17.0 g/dL 30.0 92.3 30.0  Hematocrit 39 - 52 % 43.3 41.9 45.9  Platelets 150 - 400 K/uL 162 129(L) 136(L)    ABG    Component Value Date/Time   PHART 7.326 (L) 16-Mar-2020 1315   PCO2ART 31.2 (L) March 16, 2020 1315   PO2ART 191 (H) March 16, 2020 1315   HCO3 15.8 (L) Mar 16, 2020 1315   TCO2 31 08/23/2009 2150   ACIDBASEDEF 8.6 (H) Mar 16, 2020 1315   O2SAT 99.3 03-16-2020 1315    CBG (last 3)  Recent Labs    02/22/20 0014 02/22/20 0314 02/22/20 0808  GLUCAP 361* 361* 293*    Signature:  Coralyn Helling, MD [facility] Pulmonary/Critical Care Pager - [phone] 02/22/2020, 9:58 AM

## 2020-02-22 NOTE — Progress Notes (Addendum)
Attempted to call pts daugher Ginette Otto phone went to voicemail, VM left stating to call this nurse back at [phone]. Also attempted to video call from patients cell phone, video call for Alisha not enabled.

## 2020-02-22 NOTE — Progress Notes (Signed)
Abg obtained on pt on hhfnc 60L 100% and 15l nrb.  Sample sent to lab for processing.  RN aware.

## 2020-02-22 NOTE — Progress Notes (Signed)
eLink Physician-Brief Progress Note Patient Name: [name] DOB: 1966-04-19 MRN: [id number]   Date of Service  02/22/2020  HPI/Events of Note  Notified of increasing O2 requirement throughout the day. Seen on heated HFNC and 100% NRB mask O2 sats 88-92%. Not in acute distress but does have episodes of restlessness.  eICU Interventions  ABG and CXR ordered to reassess     Intervention Category Major Interventions: Hypoxemia - evaluation and management  [name] 02/22/2020, 8:58 PM

## 2020-02-22 NOTE — Progress Notes (Signed)
Pt bladder scan complete, 50mL of urine per scan, In and out Cath done, 49mL of urine returned and sent to lab for UA.

## 2020-02-23 ENCOUNTER — Inpatient Hospital Stay (HOSPITAL_COMMUNITY): Payer: Medicare Other

## 2020-02-23 DIAGNOSIS — Z515 Encounter for palliative care: Secondary | ICD-10-CM | POA: Diagnosis not present

## 2020-02-23 DIAGNOSIS — Z66 Do not resuscitate: Secondary | ICD-10-CM | POA: Diagnosis not present

## 2020-02-23 DIAGNOSIS — A4189 Other specified sepsis: Secondary | ICD-10-CM | POA: Diagnosis not present

## 2020-02-23 DIAGNOSIS — J9601 Acute respiratory failure with hypoxia: Secondary | ICD-10-CM

## 2020-02-23 DIAGNOSIS — U071 COVID-19: Secondary | ICD-10-CM | POA: Diagnosis not present

## 2020-02-23 LAB — BASIC METABOLIC PANEL
Anion gap: 15 (ref 5–15)
Anion gap: 14 (ref 5–15)
BUN: 119 mg/dL — ABNORMAL HIGH (ref 6–20)
BUN: 120 mg/dL — ABNORMAL HIGH (ref 6–20)
CO2: 18 mmol/L — ABNORMAL LOW (ref 22–32)
CO2: 21 mmol/L — ABNORMAL LOW (ref 22–32)
Calcium: 8 mg/dL — ABNORMAL LOW (ref 8.9–10.3)
Calcium: 8.2 mg/dL — ABNORMAL LOW (ref 8.9–10.3)
Chloride: 98 mmol/L (ref 98–111)
Chloride: 98 mmol/L (ref 98–111)
Creatinine, Ser: 6.82 mg/dL — ABNORMAL HIGH (ref 0.61–1.24)
Creatinine, Ser: 7.02 mg/dL — ABNORMAL HIGH (ref 0.61–1.24)
GFR calc Af Amer: 10 mL/min — ABNORMAL LOW (ref >60)
GFR calc Af Amer: 9 mL/min — ABNORMAL LOW (ref >60)
GFR calc non Af Amer: 8 mL/min — ABNORMAL LOW (ref >60)
GFR calc non Af Amer: 8 mL/min — ABNORMAL LOW (ref >60)
Glucose, Bld: 273 mg/dL — ABNORMAL HIGH (ref 70–99)
Glucose, Bld: 286 mg/dL — ABNORMAL HIGH (ref 70–99)
Potassium: 5.6 mmol/L — ABNORMAL HIGH (ref 3.5–5.1)
Potassium: 5.8 mmol/L — ABNORMAL HIGH (ref 3.5–5.1)
Sodium: 131 mmol/L — ABNORMAL LOW (ref 135–145)
Sodium: 133 mmol/L — ABNORMAL LOW (ref 135–145)

## 2020-02-23 LAB — NA AND K (SODIUM & POTASSIUM), RAND UR
Potassium Urine: 16 mmol/L
Sodium, Ur: 23 mmol/L

## 2020-02-23 LAB — ECHOCARDIOGRAM COMPLETE
Area-P 1/2: 1.91 cm2
Height: 70 in
S' Lateral: 2.3 cm
Weight: 8064 oz

## 2020-02-23 LAB — LEGIONELLA PNEUMOPHILA SEROGP 1 UR AG: L. pneumophila Serogp 1 Ur Ag: NEGATIVE

## 2020-02-23 LAB — CBC
HCT: 43.4 % (ref 39.0–52.0)
HCT: 44.5 % (ref 39.0–52.0)
Hemoglobin: 13.6 g/dL (ref 13.0–17.0)
Hemoglobin: 13.8 g/dL (ref 13.0–17.0)
MCH: 29.8 pg (ref 26.0–34.0)
MCH: 30.1 pg (ref 26.0–34.0)
MCHC: 31 g/dL (ref 30.0–36.0)
MCHC: 31.3 g/dL (ref 30.0–36.0)
MCV: 95.2 fL (ref 80.0–100.0)
MCV: 96.9 fL (ref 80.0–100.0)
Platelets: 138 10*3/uL — ABNORMAL LOW (ref 150–400)
Platelets: 154 10*3/uL (ref 150–400)
RBC: 4.56 MIL/uL (ref 4.22–5.81)
RBC: 4.59 MIL/uL (ref 4.22–5.81)
RDW: 12.5 % (ref 11.5–15.5)
RDW: 12.6 % (ref 11.5–15.5)
WBC: 8.6 10*3/uL (ref 4.0–10.5)
WBC: 8.6 10*3/uL (ref 4.0–10.5)
nRBC: 0.7 % — ABNORMAL HIGH (ref 0.0–0.2)
nRBC: 0.9 % — ABNORMAL HIGH (ref 0.0–0.2)

## 2020-02-23 LAB — BRAIN NATRIURETIC PEPTIDE: B Natriuretic Peptide: 105 pg/mL — ABNORMAL HIGH (ref 0.0–100.0)

## 2020-02-23 LAB — GLUCOSE, CAPILLARY
Glucose-Capillary: 331 mg/dL — ABNORMAL HIGH (ref 70–99)
Glucose-Capillary: 338 mg/dL — ABNORMAL HIGH (ref 70–99)
Glucose-Capillary: 294 mg/dL — ABNORMAL HIGH (ref 70–99)
Glucose-Capillary: 253 mg/dL — ABNORMAL HIGH (ref 70–99)

## 2020-02-23 LAB — POTASSIUM
Potassium: 6.2 mmol/L — ABNORMAL HIGH (ref 3.5–5.1)
Potassium: 5.8 mmol/L — ABNORMAL HIGH (ref 3.5–5.1)
Potassium: 5.8 mmol/L — ABNORMAL HIGH (ref 3.5–5.1)
Potassium: 5.8 mmol/L — ABNORMAL HIGH (ref 3.5–5.1)

## 2020-02-23 MED ORDER — SODIUM ZIRCONIUM CYCLOSILICATE 10 G PO PACK
10.0000 g | PACK | Freq: Three times a day (TID) | ORAL | Status: AC
  Administered 2020-02-23 (×3): 10 g via ORAL
  Filled 2020-02-23 (×4): qty 1

## 2020-02-23 MED ORDER — INSULIN GLARGINE 100 UNIT/ML ~~LOC~~ SOLN
20.0000 [IU] | Freq: Two times a day (BID) | SUBCUTANEOUS | Status: DC
  Administered 2020-02-23 (×2): 20 [IU] via SUBCUTANEOUS
  Filled 2020-02-23 (×3): qty 0.2

## 2020-02-23 MED ORDER — DEXTROSE 50 % IV SOLN
1.0000 | Freq: Once | INTRAVENOUS | Status: AC
  Administered 2020-02-23: 50 mL via INTRAVENOUS
  Filled 2020-02-23: qty 50

## 2020-02-23 MED ORDER — INSULIN ASPART 100 UNIT/ML IV SOLN
5.0000 [IU] | Freq: Once | INTRAVENOUS | Status: AC
  Administered 2020-02-23: 5 [IU] via INTRAVENOUS

## 2020-02-23 MED ORDER — METOPROLOL TARTRATE 5 MG/5ML IV SOLN
2.5000 mg | Freq: Three times a day (TID) | INTRAVENOUS | Status: DC
  Administered 2020-02-23 – 2020-02-25 (×5): 2.5 mg via INTRAVENOUS
  Filled 2020-02-23 (×6): qty 5

## 2020-02-23 MED ORDER — PERFLUTREN LIPID MICROSPHERE
1.0000 mL | INTRAVENOUS | Status: DC | PRN
  Administered 2020-02-23: 4 mL via INTRAVENOUS
  Filled 2020-02-23: qty 10

## 2020-02-23 MED ORDER — METOPROLOL TARTRATE 12.5 MG HALF TABLET
12.5000 mg | ORAL_TABLET | Freq: Two times a day (BID) | ORAL | Status: DC
  Administered 2020-02-23: 12.5 mg via ORAL
  Filled 2020-02-23: qty 1

## 2020-02-23 NOTE — Progress Notes (Signed)
NAME:  [name], MRN:  [id number], DOB:  08/01/1965, LOS: 3 ADMISSION DATE:  02/15/2020, CONSULTATION DATE: 9/23 REFERRING MD: Dr. Criss Alvine, CHIEF COMPLAINT: COVID-19 pneumonia  Brief History   54 yo male developed URI symptoms on 9/20 associated with nausea and vomiting.  He presented to the ER with dyspnea.  He has not been vaccinated for COVID.  Found to have hypotension, AKI, hypoxia, lactic acidosis, elevated LFT.  Found to have COVID 19 pneumonia and elevated procalcitonin concerning for bacterial superinfection.  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  Past Medical History  Asthma, CAD, Depression, Chronic pain, pre-DM, GI bleeding, Headache, HTN, Sarcoidosis, Chorioretinal inflammation  Significant Hospital Events   9/23 Admit 9/24 start insulin gtt  Consults:  Nephrology  Procedures:    Significant Diagnostic Tests:   Abdominal ultrasound 9/24 >> normal renal size, no hydronephrosis, no gallstones  Echo 9/26 >> EF 60 to 65%, grade 1 DD, normal RV function, mild LA dilation, aortic root and proximal ascending aorta are well seen and normal in caliber  Micro Data:  COVID 9/23 >> positive MRSA Screen 9/23 >> negative Legionella Ag 9/24 >> Pneumococcal Ag 9/24 >> negative Blood 9/23 >> HIV 9/23 >> non reactive  Antimicrobials/COVID therapy:  Zyvox 9/23 >> 9/24 Cefepime 9/23 >>  Solumedrol 9/23 >>    Interim history/subjective:  Events of last night noted.  Denies chest pain.  Cough about the same.  SpO2 drops more when asleep.  He says he has sleep apnea and is supposed to be on CPAP.  Objective   Blood pressure (S) (!) 128/57, pulse 90, temperature 97.8 F (36.6 C), temperature source Oral, resp. rate (!) 30, height 5\' 10"  (1.778 m), weight (!) 228.6 kg, SpO2 (!) 83 %.    FiO2 (%):  [100 %] 100 %   Intake/Output Summary (Last 24 hours) at 02/23/2020 0851 Last data filed at 02/23/2020 0530 Gross per 24 hour  Intake  1592 ml  Output 1570 ml  Net 22 ml   Filed Weights   02/02/2020 1900 02/21/20 0605 02/22/20 0600  Weight: (!) 226.8 kg (!) 223.6 kg (!) 228.6 kg    Examination:  General - alert Eyes - pupils reactive ENT - no sinus tenderness, no stridor Cardiac - regular rate/rhythm, no murmur Chest - scattered rhonchi Abdomen - soft, non tender, + bowel sounds Extremities - 2+ non pitting edema Skin - venous stasis changes Neuro - alert, follows commands, moves extremities  Resolved Hospital Problem list   Severe sepsis, Lactic acidosis  Assessment & Plan:   Acute hypoxic respiratory failure 2nd to COVID 19 pneumonia. - defer remdesivir in setting of elevated LFTs - defer baricitinib in setting of positive procalcitonin and possibility of bacterial infection - day 4 of solumedrol - goal SpO2 85 to 95% - mobilize as able  Bacterial pneumonia superinfection. - bacterial agent clinically undetermined - day 4 of cefepime  Hx of asthma. - continue singulair - prn albuterol  Hx of obstructive sleep apnea. - will have him use Bipap when asleep  Acute renal failure from volume depletion. Anion gap metabolic acidosis with lactic acidosis. Hyperkalemia from acidosis. Hyponatremia in setting of hyperglycemia. - nephrology consulted 9/24 - continue IV fluids - lokelma ordered 9/26 - f/u BMET, monitor urine outpt - no indication for renal replacement at this time  Mediastinal widening on CXR 9/26. - Echo unremarkable - not stable enough and too big to do CT scan in [facility] - giving IV contrast  for CT angiogram would likely through him into complete renal failure requiring dialysis at this point - clinical picture doesn't fit with aortic dissection - if he has aortic dissection then current management would not change >> he is not a candidate for surgery, and his blood pressure and heart rate are in acceptable range - f/u CXR and monitor clinically  Elevated troponin from demand  ischemia. Hx of CAD, HTN, HLD. - continue ASA, zocor - will restart lopressor at 12.5 mg bid - hold outpt lasix, cozaar  Elevated LFTs. - abdominal ultrasound unrevealing - f/u LFTs intermittently  Hx of peripheral focal chorioretinal inflammation. - followed by ophthalmology at [facility] by Dr. Gae Bon - hold outpt imuran  Morbid obesity with BMI 67.44 - needs to work on weight loss  Acute metabolic encephalopathy from renal failure, hypoxia, sepsis. Hx of chronic pain. - hold outpt adderall, buprenorphine, oxycodone, tramadol - prn fentanyl  DM type 2 poorly controlled with steroid induced hyperglycemia. - HbA1C 8.8 from 9/24 - changed to resistant scale of SSI on 9/25 - increase lantus to 20 units bid on 9/26  Deconditioning. - PT/OT  Best practice:  Diet: carb modified, heart healthy DVT prophylaxis: SQ heparin GI prophylaxis: protonix Mobility: OOB to chair as able Code Status: full code Disposition: ICU  Labs    CMP Latest Ref Rng & Units 02/23/2020 02/23/2020 02/22/2020  Glucose 70 - 99 mg/dL - 009(Q) 330(Q)  BUN 6 - 20 mg/dL - 762(U) 633(H)  Creatinine 0.61 - 1.24 mg/dL - 5.45(G) 2.56(L)  Sodium 135 - 145 mmol/L - 133(L) 131(L)  Potassium 3.5 - 5.1 mmol/L 6.2(H) 5.8(H) 5.6(H)  Chloride 98 - 111 mmol/L - 98 98  CO2 22 - 32 mmol/L - 21(L) 18(L)  Calcium 8.9 - 10.3 mg/dL - 8.2(L) 8.0(L)  Total Protein 6.5 - 8.1 g/dL - - -  Total Bilirubin 0.3 - 1.2 mg/dL - - -  Alkaline Phos 38 - 126 U/L - - -  AST 15 - 41 U/L - - -  ALT 0 - 44 U/L - - -    CBC Latest Ref Rng & Units 02/23/2020 02/22/2020 02/22/2020  WBC 4.0 - 10.5 K/uL 8.6 8.6 10.5  Hemoglobin 13.0 - 17.0 g/dL 89.3 73.4 28.7  Hematocrit 39 - 52 % 43.4 44.5 43.3  Platelets 150 - 400 K/uL 138(L) 154 162    ABG    Component Value Date/Time   PHART 7.259 (L) 02/22/2020 2130   PCO2ART 49.1 (H) 02/22/2020 2130   PO2ART 77.9 (L) 02/22/2020 2130   HCO3 21.4 02/22/2020 2130   TCO2 31 08/23/2009 2150    ACIDBASEDEF 5.6 (H) 02/22/2020 2130   O2SAT 92.2 02/22/2020 2130    CBG (last 3)  Recent Labs    02/22/20 1613 02/22/20 2119 02/23/20 0753  GLUCAP 321* 284* 331*    Critical care time: 35 minutes  Coralyn Helling, MD [place] [facility] Pager - [phone] 02/23/2020, 8:51 AM

## 2020-02-23 NOTE — Progress Notes (Signed)
At 1900, pt was sating in high 70's.RT increased O2 to 65L on heated hi flow, the pt is also on 15L NRB. Maxxed out on heated hi flow. E-link was notified. Pt is not showing any additional work of breathing but is symptomatic, decreased LOC and confusion. Pt is only able to achieve low 80's on this level of oxygen, goal being 85-95% O2 sat.

## 2020-02-23 NOTE — Evaluation (Signed)
Occupational Therapy Evaluation Patient Details Name: [name] MRN: [id number] DOB: 1966-01-13 Today's Date: 02/23/2020    History of Present Illness 54 yo male developed symptoms on 9/20 associated with nausea and vomiting.  He presented to the ER with dyspnea.  He has not been vaccinated for COVID.  Found to have hypotension, AKI, hypoxia, lactic acidosis, elevated LFT.  Found to have COVID 19 pneumonia and elevated procalcitonin concerning for bacterial superinfection.   Clinical Impression   Mr. [name] is a 54 year old man who presents on 61 L HHFNC and NRB with an o2 sat 78-82% supine in bed. Patient min assist to transfer to side of bed with use of bed rail and therapist hand to pull himself forward. Patietn exhibited normal ROM and strength of upper extremities. At side of bed patient washed his face with poor quality and was assisted with washing up and donning of clean gown after condom cath failure - requiring predominantly max assist for ADLs. Patient exhibits flat affect and PLOF details limited from patient. During edge of bed sitting patient's NRB removed and sat probe placed on patient's ear and patient able to maintain o2 sat above 85%. Patient min assist to stand and pivot to recliner with hand hold. O2 sat dropped to 80% but recovered quickly to 84% with NRB assistance.  Patient will benefit from skilled OT services to address deficits and learn compensatory strategies as needed in order to improve functional abilities. At this time therapist recommends SNF at discharge. RN in room to assist therapist throughout evaluation.    Follow Up Recommendations  SNF    Equipment Recommendations  Other (comment) (defer to next venue)    Recommendations for Other Services       Precautions / Restrictions Precautions Precautions: Fall Restrictions Weight Bearing Restrictions: No      Mobility Bed Mobility Overal bed mobility: Needs Assistance Bed Mobility:  Supine to Sit     Supine to sit: HOB elevated;Min assist     General bed mobility comments: Min assist for hand hold for patient to pull himself forward to edge of bed and with use of bed rail.  Transfers Overall transfer level: Needs assistance Equipment used: 1 person hand held assist Transfers: Sit to/from [employer] Transfers   Stand pivot transfers: Min assist       General transfer comment: MIn assist for hand hold to pivot to recliner.    Balance Overall balance assessment: Mild deficits observed, not formally tested                                         ADL either performed or assessed with clinical judgement   ADL Overall ADL's : Needs assistance/impaired Eating/Feeding: Set up;Sitting   Grooming: Set up;Sitting   Upper Body Bathing: Sitting;Maximal assistance;Set up   Lower Body Bathing: Set up;Maximal assistance;Sit to/from stand   Upper Body Dressing : Moderate assistance;Set up;Sitting   Lower Body Dressing: Maximal assistance;Set up;Sit to/from stand   Toilet Transfer: Minimal assistance;Stand-pivot;BSC   Toileting- Clothing Manipulation and Hygiene: Maximal assistance;Sit to/from stand               Vision   Vision Assessment?: No apparent visual deficits     Perception     Praxis      Pertinent Vitals/Pain Pain Assessment: No/denies pain     Hand Dominance Right   Extremity/Trunk  Assessment Upper Extremity Assessment Upper Extremity Assessment: RUE deficits/detail;LUE deficits/detail RUE Deficits / Details: WNL ROM and strength LUE Deficits / Details: WNL ROM and strength           Communication Communication Communication: No difficulties   Cognition Arousal/Alertness: Awake/alert Behavior During Therapy: Flat affect Overall Cognitive Status: Within Functional Limits for tasks assessed                                 General Comments: HOH?   General Comments       Exercises      Shoulder Instructions      Home Living Family/patient expects to be discharged to:: Unsure Living Arrangements: Children Available Help at Discharge: Family;Available PRN/intermittently Type of Home: House Home Access: Level entry     Home Layout: One level               Home Equipment: None          Prior Functioning/Environment Level of Independence: Independent        Comments: daughter gets his groceries        OT Problem List: Decreased activity tolerance;Decreased knowledge of use of DME or AE;Cardiopulmonary status limiting activity;Obesity      OT Treatment/Interventions: Self-care/ADL training;Therapeutic exercise;DME and/or AE instruction;Therapeutic activities;Patient/family education;Balance training    OT Goals(Current goals can be found in the care plan section) Acute Rehab OT Goals Patient Stated Goal: Did not state OT Goal Formulation: With patient Time For Goal Achievement: 03/08/20 Potential to Achieve Goals: Good  OT Frequency: Min 2X/week   Barriers to D/C:            Co-evaluation              AM-PAC OT "6 Clicks" Daily Activity     Outcome Measure Help from another person eating meals?: A Little Help from another person taking care of personal grooming?: A Little Help from another person toileting, which includes using toliet, bedpan, or urinal?: A Lot Help from another person bathing (including washing, rinsing, drying)?: A Lot Help from another person to put on and taking off regular upper body clothing?: A Lot Help from another person to put on and taking off regular lower body clothing?: A Lot 6 Click Score: 14   End of Session Equipment Utilized During Treatment: Oxygen Nurse Communication: Mobility status;Need for lift equipment  Activity Tolerance: Patient tolerated treatment well Patient left: in chair;with call bell/phone within reach;with nursing/sitter in room  OT Visit Diagnosis: Muscle weakness  (generalized) (M62.81)                Time: 1110-1150 OT Time Calculation (min): 40 min Charges:  OT General Charges $OT Visit: 1 Visit OT Evaluation $OT Eval Moderate Complexity: 1 Mod OT Treatments $Self Care/Home Management : 8-22 mins  Tariana Moldovan, OTR/L Acute Care Rehab Services  Office [phone] Pager: [phone]   Kelli Churn 02/23/2020, 1:47 PM

## 2020-02-23 NOTE — Progress Notes (Signed)
Echocardiogram 2D Echocardiogram has been performed.  [name] 02/23/2020, 5:02 AM   Dr. Kathi Ludwig notified of stat

## 2020-02-23 NOTE — Progress Notes (Signed)
E-link reviewed echo and determined it to be inconclusive, requested for RN to get repeated BP's in each arm. L arm BP: 157/78 MAP 99. R arm BP: 128/57 MAP 77. E-link aware.

## 2020-02-23 NOTE — Progress Notes (Signed)
PT Cancellation Note  Patient Details Name: [name] MRN: [id number] DOB: [birth year]   Cancelled Treatment:    Reason Eval/Treat Not Completed: Patient not medically ready. Will continue to follow and check back. Will proceed with PT eval once appropriate. Thanks.   Faye Ramsay, PT Acute Rehabilitation  Office: [phone] Pager: [phone]       ,

## 2020-02-23 NOTE — Progress Notes (Signed)
[facility] Progress Note  Subjective:  Oxygenation worse , on very high flow O2 this am.  Seen in room, no c/o.  1.6 L UOP yesterday, 600 cc today so far.  Total I/O since admission 5.3 L in and 3.6 L out.  CXR w/ bilat patchy infiltrates and bilat small effusions. Had I/O cath yest and 20 cc returned, bladder scan correlated pre cath at 15 cc .  Creat 7.0 this am, K 5.8 and Na 133 CO2 21. Pt seen in ICU, no c/o this am.  Ox 3  Vitals:   02/23/20 0400 02/23/20 0430 02/23/20 0525 02/23/20 0530  BP: (!) 166/148  (S) (!) 157/78 (S) (!) 128/57  Pulse: 86 88 91   Resp: (!) 25 (!) 30 (!) 28   Temp:      TempSrc:      SpO2: (!) 76% (!) 79% (!) 78%   Weight:      Height:        Exam: Gen marked obesity, lying w/ FM O2, not in distress No rash, cyanosis or gangrene Sclera anicteric, throat clear  No jvd or bruits Chest clear bilat to bases RRR no MRG Abd soft ntnd no mass or ascites +bs obese GU normal male  MS no joint effusions or deformity Ext no leg or UE edema, no wounds or ulcers Neuro is alert, Ox 3 , nf, on asterixis  Home meds:  - asa 81/ lasix 40 qd/ losartan 50 qd/ metoprolol xl 50 qd/ zocor 40 hs  - ultram 50 qid prn/ oxycodone 20 qid prn/ buprenorphine 1 patch weekly/ adderall 10 tid prn  - imuran 50 mg / pred 10 qd  - meclizine 25 qid prn  - singulair 10 hs/ protonix 40 qd  - prn's/ vitamins/ supplements     UNa 38,  UCr 164   UA pending   Abd [place] - 11 cm kidneys w/o hydro or stones     CXR 9/24 - IMPRESSION: 1. Low lung volumes. Persistent bibasilar infiltrates without interim change. Small bilateral pleural effusions cannot be excluded. 2. Stable cardiomegaly. No pulmonary venous congestion  Assessment/ Plan: 1. Renal failure - creat in 2019 was normal. Presumably this is AKI from COVID infection and/or hypotension/ ischemic ATN.  Making urine. I/O about 1.5 L + since admit. US shows normal appearing kidneys. Creat 7.6 on admit > peaked at 8.0 on  9/24 > down to 7.5 yest and 7.0 today. BUN up 120 d/t steroids. No indication for RRT at this time. Will lower IVF's to 50 cc/hr for now. Will follow.  2. Hyperkalemia - cont RENAL diet, start lokelma 10 gm tid 3. Volume - does not look vol overloaded, making urine and no edema on exam.   4. COVID PNA - sig bilat infiltrates per CXR, on very high flow O2.  5. H/o HTN - on arb/ BB at home, holding both, BP's soft to normal 6. Sarcoidosis - on imuran/ pred at home 7. Morbid obesity        Rob Jovan Schickling 02/23/2020, 6:52 AM   Recent Labs  Lab 02/21/20 0043 02/21/20 1036 02/22/20 0656 02/22/20 0656 02/22/20 2359 02/23/20 0348  K 5.6*   < > 5.8*   < > 5.6* 5.8*  BUN 94*   < > 119*   < > 119* 120*  CREATININE 7.67*   < > 7.62*   < > 6.82* 7.02*  CALCIUM 8.0*   < > 8.2*   < > 8.0* 8.2*  PHOS 6.8*  --  6.8*  --   --   --   HGB 13.1  --  13.5   < > 13.8 13.6   < > = values in this interval not displayed.   Inpatient medications: . aspirin EC  81 mg Oral Daily  . Chlorhexidine Gluconate Cloth  6 each Topical Daily  . insulin aspart  5 Units Intravenous Once   And  . dextrose  1 ampule Intravenous Once  . heparin  5,000 Units Subcutaneous Q8H  . insulin aspart  0-20 Units Subcutaneous TID WC  . insulin aspart  0-5 Units Subcutaneous QHS  . insulin glargine  20 Units Subcutaneous Daily  . mouth rinse  15 mL Mouth Rinse BID  . methylPREDNISolone (SOLU-MEDROL) injection  60 mg Intravenous Q8H  . montelukast  10 mg Oral QHS  . pantoprazole  40 mg Oral Daily  . simvastatin  40 mg Oral QHS  . sodium chloride flush  3 mL Intravenous Q12H   . sodium chloride Stopped (02/22/20 2300)  . ceFEPime (MAXIPIME) IV Stopped (02/22/20 1105)  . sodium chloride     fentaNYL (SUBLIMAZE) injection, labetalol, ondansetron (ZOFRAN) IV, perflutren lipid microspheres (DEFINITY) IV suspension

## 2020-02-23 NOTE — Progress Notes (Signed)
eLink Physician-Brief Progress Note Patient Name: [name] DOB: [birth year] MRN: [id number]   Date of Service  02/23/2020  HPI/Events of Note  K 5.8, creatinine 7.02  eICU Interventions  Hyperkalemia protocol ordered     Intervention Category Major Interventions: Electrolyte abnormality - evaluation and management  [name] 02/23/2020, 6:22 AM

## 2020-02-23 NOTE — Progress Notes (Signed)
Video call with pts daughter [name] via patients cell phone [name] updated on patients condition. Informed [name] that the charger pt has in room is not charging phone, she state that she would try to find one that works and bring it up tomorrow

## 2020-02-24 ENCOUNTER — Inpatient Hospital Stay (HOSPITAL_COMMUNITY): Payer: Medicare Other

## 2020-02-24 DIAGNOSIS — J9601 Acute respiratory failure with hypoxia: Secondary | ICD-10-CM

## 2020-02-24 DIAGNOSIS — N170 Acute kidney failure with tubular necrosis: Secondary | ICD-10-CM

## 2020-02-24 LAB — RENAL FUNCTION PANEL
Albumin: 2.3 g/dL — ABNORMAL LOW (ref 3.5–5.0)
Anion gap: 16 — ABNORMAL HIGH (ref 5–15)
BUN: 117 mg/dL — ABNORMAL HIGH (ref 6–20)
CO2: 20 mmol/L — ABNORMAL LOW (ref 22–32)
Calcium: 8.8 mg/dL — ABNORMAL LOW (ref 8.9–10.3)
Chloride: 103 mmol/L (ref 98–111)
Creatinine, Ser: 6.48 mg/dL — ABNORMAL HIGH (ref 0.61–1.24)
GFR calc Af Amer: 10 mL/min — ABNORMAL LOW (ref >60)
GFR calc non Af Amer: 9 mL/min — ABNORMAL LOW (ref >60)
Glucose, Bld: 293 mg/dL — ABNORMAL HIGH (ref 70–99)
Phosphorus: 5.6 mg/dL — ABNORMAL HIGH (ref 2.5–4.6)
Potassium: 5.9 mmol/L — ABNORMAL HIGH (ref 3.5–5.1)
Sodium: 139 mmol/L (ref 135–145)

## 2020-02-24 LAB — CBC
HCT: 42.3 % (ref 39.0–52.0)
Hemoglobin: 13.8 g/dL (ref 13.0–17.0)
MCH: 30.5 pg (ref 26.0–34.0)
MCHC: 32.6 g/dL (ref 30.0–36.0)
MCV: 93.4 fL (ref 80.0–100.0)
Platelets: 138 10*3/uL — ABNORMAL LOW (ref 150–400)
RBC: 4.53 MIL/uL (ref 4.22–5.81)
RDW: 12.8 % (ref 11.5–15.5)
WBC: 8.9 10*3/uL (ref 4.0–10.5)
nRBC: 0.7 % — ABNORMAL HIGH (ref 0.0–0.2)

## 2020-02-24 LAB — GLUCOSE, CAPILLARY
Glucose-Capillary: 273 mg/dL — ABNORMAL HIGH (ref 70–99)
Glucose-Capillary: 268 mg/dL — ABNORMAL HIGH (ref 70–99)
Glucose-Capillary: 211 mg/dL — ABNORMAL HIGH (ref 70–99)
Glucose-Capillary: 174 mg/dL — ABNORMAL HIGH (ref 70–99)

## 2020-02-24 LAB — POTASSIUM
Potassium: 5 mmol/L (ref 3.5–5.1)
Potassium: 5.6 mmol/L — ABNORMAL HIGH (ref 3.5–5.1)
Potassium: 5.8 mmol/L — ABNORMAL HIGH (ref 3.5–5.1)

## 2020-02-24 MED ORDER — LACTULOSE 10 GM/15ML PO SOLN: 30.0000 g | Freq: Once | ORAL | Status: DC

## 2020-02-24 MED ORDER — PROSOURCE PLUS PO LIQD
30.0000 mL | Freq: Four times a day (QID) | ORAL | Status: DC
  Administered 2020-02-24 – 2020-02-26 (×4): 30 mL via ORAL
  Filled 2020-02-24 (×3): qty 30

## 2020-02-24 MED ORDER — KATE FARMS STANDARD 1.4 PO LIQD
325.0000 mL | Freq: Two times a day (BID) | ORAL | Status: DC
  Filled 2020-02-24: qty 325

## 2020-02-24 MED ORDER — LACTULOSE 10 GM/15ML PO SOLN
30.0000 g | Freq: Once | ORAL | Status: AC
  Administered 2020-02-24: 30 g via ORAL
  Filled 2020-02-24: qty 45

## 2020-02-24 MED ORDER — KATE FARMS STANDARD 1.4 PO LIQD
325.0000 mL | Freq: Every day | ORAL | Status: DC
  Filled 2020-02-24 (×3): qty 325

## 2020-02-24 MED ORDER — SODIUM POLYSTYRENE SULFONATE 15 GM/60ML PO SUSP
30.0000 g | Freq: Once | ORAL | Status: DC
  Filled 2020-02-24: qty 120

## 2020-02-24 MED ORDER — INSULIN GLARGINE 100 UNIT/ML ~~LOC~~ SOLN
25.0000 [IU] | Freq: Two times a day (BID) | SUBCUTANEOUS | Status: DC
  Administered 2020-02-24 – 2020-02-27 (×7): 25 [IU] via SUBCUTANEOUS
  Filled 2020-02-24 (×7): qty 0.25

## 2020-02-24 MED ORDER — INSULIN ASPART 100 UNIT/ML ~~LOC~~ SOLN
5.0000 [IU] | Freq: Three times a day (TID) | SUBCUTANEOUS | Status: DC
  Administered 2020-02-24 – 2020-02-25 (×5): 5 [IU] via SUBCUTANEOUS

## 2020-02-24 NOTE — Progress Notes (Signed)
Initial Nutrition Assessment  DOCUMENTATION CODES:   Morbid obesity  INTERVENTION:  - will order [employer] 1.4 po once/day, each supplement provides 455 kcal and 20 grams protein; provides 725 mg K and 300 mg Phos.  - will order 30 ml Pro[employer] Plus QID, each supplement provides 100 kcal and 15 grams protein; each packet provides 8 mg K and 24 mg Phos.  - will liberalize diet from Renal/Carb Modified to Regular--able to discuss and receive ok from [place].   NUTRITION DIAGNOSIS:   Increased nutrient needs related to acute illness, catabolic illness (COVID-19 infection) as evidenced by estimated needs.  GOAL:   Patient will meet greater than or equal to 90% of their needs  MONITOR:   PO intake, Supplement acceptance, Labs, Weight trends  REASON FOR ASSESSMENT:   Consult Poor PO  ASSESSMENT:   54 year old male with medical history of asthma, sarcoidosis, HTN, and CAD. He was in his usual state of health until 9/20 when he developed upper respiratory symptoms including cough and shortness of breath and also N/V. Patient has had poor oral intake since that time. He is not vaccinated against COVID-19. In the ED he was hypoxic on 10L O2.  The only documented meal intakes since admission were 25% of lunch on 9/24, 15% of breakfast and 0% of lunch on 9/26.   Able to talk with RN who reports that patient declined breakfast today and that when asked about lunch, he told her to order whatever. RN reports that patient's pulse ox was changed and new one is more accurate and when applied indicated some degree of hypoxia. She states that patient has seemed somewhat confused and is slow to respond. He seems to have somewhat of a depressed affect.   Noted hyperkalemia and hyperphosphatemia but do not feel these are due to or exacerbated by diet as patient has been eating so little.   Weight on 9/25 was 504 lb, weight on 9/24 was 493 lb, and weight on 9/23 (admission) was 500 lb. PTA, the most  recently documented weight was on 12/04/17 when he weighed 469 lb. No information documented in edema section of flow sheet.    Labs reviewed; CBGs: 273 and 268 mg/dl, K: 5.9 mmol/l, BUN: 627 mg/dl, creatinine: 0.35 mg/dl, Phos: 5.6 mg/dl, GFR: 10 ml/min.  Medications reviewed; sliding scale novolog, 5 units novolog TID, 25 units lantus BID, 30 g lactulose x1 dose 9/27, 60 mg solu-medrol TID, 40 mg oral protonix/day, 30 g katexalate x1 dose 9/27, 10 g lokelma x3 doses 9/26.    NUTRITION - FOCUSED PHYSICAL EXAM:  did not complete at this time.   Diet Order:   Diet Order            Diet regular Room service appropriate? Yes; Fluid consistency: Thin  Diet effective now                 EDUCATION NEEDS:   Not appropriate for education at this time  Skin:  Skin Assessment: Reviewed RN Assessment  Last BM:  9/27 (type 6 and type 7)  Height:   Ht Readings from Last 1 Encounters:  02/22/20 5\' 10"  (1.778 m)    Weight:   Wt Readings from Last 1 Encounters:  02/22/20 (!) 228.6 kg     Estimated Nutritional Needs:  Kcal:  2700 kcal (20 kcal/kg adjBW) Protein:  120-130 grams Fluid:  >/= 2.5 L/day     02/24/20, MS, RD, LDN, CNSC Inpatient Clinical Dietitian RD pager # available  in [facility]  After hours/weekend pager # available in [facility]

## 2020-02-24 NOTE — Progress Notes (Signed)
NAME:  [name], MRN:  [id number], DOB:  [birth year], LOS: 4 ADMISSION DATE:  2020/02/25, CONSULTATION DATE: 9/23 REFERRING MD: Dr. Criss Alvine, CHIEF COMPLAINT: COVID-19 pneumonia  Brief History   54 yo male developed URI symptoms on 9/20 associated with nausea and vomiting.  He presented to the ER with dyspnea.  He has not been vaccinated for COVID.  Found to have hypotension, AKI, hypoxia, lactic acidosis, elevated LFT.  Found to have COVID 19 pneumonia and elevated procalcitonin concerning for bacterial superinfection.  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  Past Medical History  Asthma, CAD, Depression, Chronic pain, pre-DM, GI bleeding, Headache, HTN, Sarcoidosis, Chorioretinal inflammation  Significant Hospital Events   9/23 Admit 9/24 start insulin gtt 9/27 (over weekend synopses) requiring high flow oxygen. Renal fxn worse (nephro consulted) Lokelma given for on-going Hyperkalemia.  Consults:  Nephrology  Procedures:    Significant Diagnostic Tests:   Abdominal ultrasound 9/24 >> normal renal size, no hydronephrosis, no gallstones  Echo 9/26 >> EF 60 to 65%, grade 1 DD, normal RV function, mild LA dilation, aortic root and proximal ascending aorta are well seen and normal in caliber  Micro Data:  COVID 9/23 >> positive MRSA Screen 9/23 >> negative Legionella Ag 9/24 >> Pneumococcal Ag 9/24 >> negative Blood 9/23 >> HIV 9/23 >> non reactive  Antimicrobials/COVID therapy:  Zyvox 9/23 >> 9/24 Cefepime 9/23 >>  Solumedrol 9/23 >>    Interim history/subjective:  Wants to have BM Objective   Blood pressure (Abnormal) 158/87, pulse 89, temperature 97.8 F (36.6 C), temperature source Oral, resp. rate (Abnormal) 31, height 5\' 10"  (1.778 m), weight (Abnormal) 228.6 kg, SpO2 91 %.    Vent Mode: BIPAP FiO2 (%):  [100 %] 100 % Set Rate:  [10 bmp] 10 bmp PEEP:  [8 cmH20] 8 cmH20   Intake/Output Summary (Last 24 hours) at  02/24/2020 0919 Last data filed at 02/24/2020 0800 Gross per 24 hour  Intake 1104.67 ml  Output 1350 ml  Net -245.33 ml   Filed Weights   2020/02/25 1900 02/21/20 0605 02/22/20 0600  Weight: (Abnormal) 226.8 kg (Abnormal) 223.6 kg (Abnormal) 228.6 kg    Examination: General this is a 54 year old male resting in bed. He is a little anxious currently needs to have BM HENT NCAT MM are moist. Actually having some trouble w/ oral secretions  Pulm coarse and diffuse rhonchi. No accessory use at rest. Now on 65 liter pm and 100% heated high flow. Sats are 94% Card RRR abd not tender obese Ext chronic venous stasis changes Neuro awake, cooperative. No focal def   Resolved Hospital Problem list   Severe sepsis, Lactic acidosis  Assessment & Plan:   Acute hypoxic respiratory failure 2nd to COVID 19 pneumonia. - defer remdesivir in setting of elevated LFTs - defer baricitinib in setting of positive procalcitonin and possibility of bacterial infection -pcxr reviewed. Diffuse bilateral (but R>L) airspace disease w/ basilar consolidation. I think looks a little worse today  Plan Day 5 solumedrol -> can start taper on 9/27 Cont pulse ox Titrate FIO2 for SATS > 85%  Bacterial pneumonia superinfection. - bacterial agent clinically undetermined Plan Day 5 of 7 cefepime  Hx of asthma. Plan Cont singulair and PRN albuterol    Hx of obstructive sleep apnea. Plan BIPAP at HS   Acute renal failure from volume depletion. Anion gap metabolic acidosis with lactic acidosis. Hyperkalemia from acidosis. Hyponatremia in setting of hyperglycemia. -has been getting Lokelma  -  am chemistry pending; but as of 9/26 all renal indices have been getting worse.  Plan Cont Lokelma  Further recs per nephro pending chemistry results   Mediastinal widening on CXR 9/26. - Echo unremarkable; Clinically not c/w aortic dissection  - not stable enough and too big to do CT scan in [facility] Plan Trend clinical  picture.  BP control. Not surgical candidate at this point   Elevated troponin from demand ischemia. Hx of CAD, HTN, HLD. Plan Cont asa, zocor  Cont lopressor at 12.5 bid Holding lasix anx cozaar  Acute metabolic encephalopathy from renal failure, hypoxia, sepsis. Hx of chronic pain. - holding outpt adderall, buprenorphine, oxycodone, tramadol Plan PRN fent  DM type 2 poorly controlled with steroid induced hyperglycemia. - HbA1C 8.8 from 9/24 - changed to resistant scale of SSI on 9/25 -still w/ poor control Plan Cont resistant scale SSI w/ HS coverage Add meal time coverage Inc lantus to 25 bid  Hx of peripheral focal chorioretinal inflammation. - followed by ophthalmology at [facility] by Dr. Gae Bon Plan Holding imuran for now   Elevated LFTs. - abdominal ultrasound unrevealing Plan Will check LFTs am    Morbid obesity with BMI 67.44 Plan If survives will need to work on weight loss   Deconditioning. Able to stand and pivot to recliner w/ min assist as of 9/26 Plan Cont PT/OT  They are currently recommending SNF  Constipation  Plan Got lactulose  Re-assess    Best practice:  Diet: carb modified, heart healthy DVT prophylaxis: SQ heparin GI prophylaxis: protonix Mobility: OOB to chair as able Code Status: full code Disposition: ICU  My cct 34 min  Simonne Martinet ACNP-BC [facility] Pulmonary/Critical Care Pager # [phone] OR # [phone] if no answer

## 2020-02-24 NOTE — Progress Notes (Signed)
[facility] Progress Note  Subjective:   We discussed his renal function and discussed risks/benefits/indications for dialysis and he states would be willing if indicated.  had 1.2 liters UOP over 9/26 charted.   Review of systems:  Short of breath - better than yesterday per pt Denies n/v Denies chest pain   Vitals:   02/24/20 0900 02/24/20 1000 02/24/20 1137 02/24/20 1200  BP:      Pulse: 91 97  96  Resp: (!) 28 (!) 38  (!) 30  Temp:   (!) 97.5 F (36.4 C)   TempSrc:   Axillary   SpO2: 91% 92% (!) 86% (!) 83%  Weight:      Height:        Exam: Gen marked obesity, lying w/ face mask oxygen  NCAT  Chest clear but reduced; face mask as above; unlabored at rest S1S2 no rub Abd soft nt obese habitus Ext trace lower extremity edema  Neuro is alert, Ox 3 , nf, no asterixis; follows commands Psych normal mood and affect  Home meds:  - asa 81/ lasix 40 qd/ losartan 50 qd/ metoprolol xl 50 qd/ zocor 40 hs  - ultram 50 qid prn/ oxycodone 20 qid prn/ buprenorphine 1 patch weekly/ adderall 10 tid prn  - imuran 50 mg / pred 10 qd  - meclizine 25 qid prn  - singulair 10 hs/ protonix 40 qd  - prn's/ vitamins/ supplements     UNa 38,  UCr 164   UA pending   Abd [place] - 11 cm kidneys w/o hydro or stones     CXR 9/24 - IMPRESSION: 1. Low lung volumes. Persistent bibasilar infiltrates without interim change. Small bilateral pleural effusions cannot be excluded. 2. Stable cardiomegaly. No pulmonary venous congestion  Assessment/ Plan: 1. AKI - creat in 2019 was normal. Presumably this is AKI from COVID infection and/or hypotension/ ischemic ATN.  US shows normal appearing kidneys. Creat 7.6 on admit > peaked at 8.0 on 9/24.  downtrending Cr. BUN up with steroids.   1. Given recurrent hyperkalemia and persistent high flow oxygen requirement with covid am willing to initiate RRT if pulm feels would help from resp standpoint.  Will discuss with pulm.  Would need line and  CRRT only option at [facility]  2. kayexalate for now 3. Hopeful for continued improvement in Cr.  2. Hyperkalemia - renal diet, s/p lokelma - will give kayexalate today (9/27). Repeat K later today  3. Volume - does not look vol overloaded, making urine 4. COVID PNA - sig bilat infiltrates per CXR, high flow oxygen  5. H/o HTN - on arb/ BB at home 6. Sarcoidosis - on imuran/ pred at home 7. Morbid obesity - complicates volume assessment    Recent Labs  Lab 02/22/20 0656 02/22/20 2359 02/23/20 0348 02/23/20 0727 02/24/20 0314 02/24/20 0815  K 5.8*   < > 5.8*   < > 5.6* 5.9*  BUN 119*   < > 120*  --   --  117*  CREATININE 7.62*   < > 7.02*  --   --  6.48*  CALCIUM 8.2*   < > 8.2*  --   --  8.8*  PHOS 6.8*  --   --   --   --  5.6*  HGB 13.5   < > 13.6  --   --  13.8   < > = values in this interval not displayed.   Inpatient medications: . aspirin EC  81 mg  Oral Daily  . Chlorhexidine Gluconate Cloth  6 each Topical Daily  . heparin  5,000 Units Subcutaneous Q8H  . insulin aspart  0-20 Units Subcutaneous TID WC  . insulin aspart  0-5 Units Subcutaneous QHS  . insulin aspart  5 Units Subcutaneous TID WC  . insulin glargine  25 Units Subcutaneous BID  . mouth rinse  15 mL Mouth Rinse BID  . methylPREDNISolone (SOLU-MEDROL) injection  60 mg Intravenous Q8H  . metoprolol tartrate  2.5 mg Intravenous Q8H  . montelukast  10 mg Oral QHS  . pantoprazole  40 mg Oral Daily  . simvastatin  40 mg Oral QHS  . sodium chloride flush  3 mL Intravenous Q12H   . sodium chloride 50 mL/hr at 02/24/20 0747  . ceFEPime (MAXIPIME) IV 2 g (02/24/20 1015)   fentaNYL (SUBLIMAZE) injection, labetalol, ondansetron (ZOFRAN) IV   Estanislado Emms, MD 12:53 PM 02/24/2020

## 2020-02-24 NOTE — Progress Notes (Signed)
Spoke with Dr. Malen Gauze  We will continue monitoring closely  Monitor electrolytes creatinine slightly better  Keep potassium on the check  Willing to start dialysis but I think we can hold off for now and see how he trends the next 24 hours

## 2020-02-24 NOTE — Progress Notes (Signed)
PT Cancellation Note  Patient Details Name: [name] MRN: [id number] DOB: 01-May-1966   Cancelled Treatment:    Reason Eval/Treat Not Completed: Patient not medically ready (Pt k+ elveated to 5.9. Will follow up at later date/time as shedule allows and pt medically ready.)  Wynn Maudlin, DPT Acute Rehabilitation Services  Office [phone] Pager [phone]  02/24/2020 12:42 PM

## 2020-02-24 NOTE — Progress Notes (Signed)
During shift assessment and with RT, removed BIPAP for short time (less than 15 minutes) to give oral medications, hydration, and allow for video chat with family. Patient's oxygen level was between 81-83% during this time, so BIPAP put back on and saturation level went up to 88%. Daughter Helmut Muster thankful for communication and patient able to express love for family. Continuing to monitor needs.

## 2020-02-24 NOTE — Progress Notes (Signed)
eLink Physician-Brief Progress Note Patient Name: [name] DOB: 1965/06/25 MRN: [id number]   Date of Service  02/24/2020  HPI/Events of Note  Reportedly no BM for 11 days. Given Lokelma for hyperkalemia  eICU Interventions  Ordered Lactulose 30 g x 1     Intervention Category Minor Interventions: Routine modifications to care plan (e.g. PRN medications for pain, fever)  Irving Burton T Ashely Joshua 02/24/2020, 1:03 AM

## 2020-02-24 NOTE — Progress Notes (Signed)
Pt becoming increasingly restless and anxious. Spoke with Dr. Arsenio Loader about getting meds, he stated that he didn't want to give benzos if the patient was not intubated.

## 2020-02-25 ENCOUNTER — Inpatient Hospital Stay (HOSPITAL_COMMUNITY): Payer: Medicare Other

## 2020-02-25 DIAGNOSIS — N179 Acute kidney failure, unspecified: Secondary | ICD-10-CM

## 2020-02-25 DIAGNOSIS — J80 Acute respiratory distress syndrome: Secondary | ICD-10-CM

## 2020-02-25 LAB — BLOOD GAS, ARTERIAL
Acid-base deficit: 1.4 mmol/L (ref 0.0–2.0)
Acid-base deficit: 2.1 mmol/L — ABNORMAL HIGH (ref 0.0–2.0)
Acid-base deficit: 2.8 mmol/L — ABNORMAL HIGH (ref 0.0–2.0)
Bicarbonate: 23.7 mmol/L (ref 20.0–28.0)
Bicarbonate: 24.1 mmol/L (ref 20.0–28.0)
Bicarbonate: 24.4 mmol/L (ref 20.0–28.0)
Expiratory PAP: 12
FIO2: 100
FIO2: 100
Inspiratory PAP: 20
O2 Saturation: 85.7 %
O2 Saturation: 73.8 %
O2 Saturation: 89.3 %
PEEP: 12 cmH2O
Patient temperature: 98.1
Patient temperature: 98.1
Patient temperature: 98.8
RATE: 8 resp/min
pCO2 arterial: 43.1 mmHg (ref 32.0–48.0)
pCO2 arterial: 49.4 mmHg — ABNORMAL HIGH (ref 32.0–48.0)
pCO2 arterial: 55.2 mmHg — ABNORMAL HIGH (ref 32.0–48.0)
pH, Arterial: 7.358 (ref 7.350–7.450)
pH, Arterial: 7.308 — ABNORMAL LOW (ref 7.350–7.450)
pH, Arterial: 7.268 — ABNORMAL LOW (ref 7.350–7.450)
pO2, Arterial: 56.8 mmHg — ABNORMAL LOW (ref 83.0–108.0)
pO2, Arterial: 48.9 mmHg — ABNORMAL LOW (ref 83.0–108.0)
pO2, Arterial: 72.6 mmHg — ABNORMAL LOW (ref 83.0–108.0)

## 2020-02-25 LAB — D-DIMER, QUANTITATIVE: D-Dimer, Quant: 1.79 ug/mL-FEU — ABNORMAL HIGH (ref 0.00–0.50)

## 2020-02-25 LAB — RENAL FUNCTION PANEL
Albumin: 2.3 g/dL — ABNORMAL LOW (ref 3.5–5.0)
Anion gap: 11 (ref 5–15)
BUN: 109 mg/dL — ABNORMAL HIGH (ref 6–20)
CO2: 25 mmol/L (ref 22–32)
Calcium: 8.6 mg/dL — ABNORMAL LOW (ref 8.9–10.3)
Chloride: 108 mmol/L (ref 98–111)
Creatinine, Ser: 5.55 mg/dL — ABNORMAL HIGH (ref 0.61–1.24)
GFR calc Af Amer: 12 mL/min — ABNORMAL LOW (ref >60)
GFR calc non Af Amer: 11 mL/min — ABNORMAL LOW (ref >60)
Glucose, Bld: 196 mg/dL — ABNORMAL HIGH (ref 70–99)
Phosphorus: 4.8 mg/dL — ABNORMAL HIGH (ref 2.5–4.6)
Potassium: 5.1 mmol/L (ref 3.5–5.1)
Sodium: 144 mmol/L (ref 135–145)

## 2020-02-25 LAB — POTASSIUM
Potassium: 5.1 mmol/L (ref 3.5–5.1)
Potassium: 5.1 mmol/L (ref 3.5–5.1)

## 2020-02-25 LAB — CBC
HCT: 40.1 % (ref 39.0–52.0)
Hemoglobin: 12.5 g/dL — ABNORMAL LOW (ref 13.0–17.0)
MCH: 30.5 pg (ref 26.0–34.0)
MCHC: 31.2 g/dL (ref 30.0–36.0)
MCV: 97.8 fL (ref 80.0–100.0)
Platelets: 158 10*3/uL (ref 150–400)
RBC: 4.1 MIL/uL — ABNORMAL LOW (ref 4.22–5.81)
RDW: 12.8 % (ref 11.5–15.5)
WBC: 8.7 10*3/uL (ref 4.0–10.5)
nRBC: 0.6 % — ABNORMAL HIGH (ref 0.0–0.2)

## 2020-02-25 LAB — CULTURE, BLOOD (ROUTINE X 2)
Culture: NO GROWTH
Special Requests: ADEQUATE

## 2020-02-25 LAB — LACTIC ACID, PLASMA: Lactic Acid, Venous: 1.8 mmol/L (ref 0.5–1.9)

## 2020-02-25 LAB — GLUCOSE, CAPILLARY
Glucose-Capillary: 181 mg/dL — ABNORMAL HIGH (ref 70–99)
Glucose-Capillary: 170 mg/dL — ABNORMAL HIGH (ref 70–99)
Glucose-Capillary: 143 mg/dL — ABNORMAL HIGH (ref 70–99)
Glucose-Capillary: 128 mg/dL — ABNORMAL HIGH (ref 70–99)

## 2020-02-25 LAB — TRIGLYCERIDES: Triglycerides: 260 mg/dL — ABNORMAL HIGH (ref <150)

## 2020-02-25 MED ORDER — PROPOFOL 1000 MG/100ML IV EMUL
5.0000 ug/kg/min | INTRAVENOUS | Status: DC
  Administered 2020-02-25: 35 ug/kg/min via INTRAVENOUS
  Administered 2020-02-25: 20 ug/kg/min via INTRAVENOUS
  Administered 2020-02-25: 35 ug/kg/min via INTRAVENOUS
  Administered 2020-02-26: 50 ug/kg/min via INTRAVENOUS
  Administered 2020-02-26: 40 ug/kg/min via INTRAVENOUS
  Administered 2020-02-26: 45 ug/kg/min via INTRAVENOUS
  Administered 2020-02-26 (×13): 70 ug/kg/min via INTRAVENOUS
  Administered 2020-02-26: 80 ug/kg/min via INTRAVENOUS
  Administered 2020-02-26 (×2): 70 ug/kg/min via INTRAVENOUS
  Administered 2020-02-27: 45 ug/kg/min via INTRAVENOUS
  Administered 2020-02-27: 40 ug/kg/min via INTRAVENOUS
  Administered 2020-02-27: 35 ug/kg/min via INTRAVENOUS
  Administered 2020-02-27 (×5): 50 ug/kg/min via INTRAVENOUS
  Administered 2020-02-27 (×2): 40 ug/kg/min via INTRAVENOUS
  Filled 2020-02-25 (×2): qty 100
  Filled 2020-02-25: qty 200
  Filled 2020-02-25 (×3): qty 100
  Filled 2020-02-25: qty 200
  Filled 2020-02-25: qty 100
  Filled 2020-02-25: qty 200
  Filled 2020-02-25 (×3): qty 100
  Filled 2020-02-25: qty 200
  Filled 2020-02-25 (×2): qty 100
  Filled 2020-02-25 (×2): qty 200
  Filled 2020-02-25 (×4): qty 100
  Filled 2020-02-25 (×3): qty 200
  Filled 2020-02-25 (×2): qty 100

## 2020-02-25 MED ORDER — FENTANYL CITRATE (PF) 100 MCG/2ML IJ SOLN
INTRAMUSCULAR | Status: AC
  Administered 2020-02-25: 100 ug via INTRAVENOUS
  Filled 2020-02-25: qty 2

## 2020-02-25 MED ORDER — METHYLPREDNISOLONE SODIUM SUCC 40 MG IJ SOLR
40.0000 mg | Freq: Two times a day (BID) | INTRAMUSCULAR | Status: DC
  Administered 2020-02-25 – 2020-02-27 (×4): 40 mg via INTRAVENOUS
  Filled 2020-02-25 (×4): qty 1

## 2020-02-25 MED ORDER — ETOMIDATE 2 MG/ML IV SOLN
INTRAVENOUS | Status: AC
  Administered 2020-02-25: 20 mg via INTRAVENOUS
  Filled 2020-02-25: qty 20

## 2020-02-25 MED ORDER — VECURONIUM BROMIDE 10 MG IV SOLR
INTRAVENOUS | Status: AC
  Filled 2020-02-25: qty 10

## 2020-02-25 MED ORDER — ROCURONIUM BROMIDE 10 MG/ML (PF) SYRINGE
PREFILLED_SYRINGE | INTRAVENOUS | Status: AC
  Administered 2020-02-25: 30 mg via INTRAVENOUS
  Filled 2020-02-25: qty 10

## 2020-02-25 MED ORDER — ETOMIDATE 2 MG/ML IV SOLN: 20.0000 mg | Freq: Once | INTRAVENOUS | Status: AC

## 2020-02-25 MED ORDER — NOREPINEPHRINE 4 MG/250ML-% IV SOLN
0.0000 ug/min | INTRAVENOUS | Status: DC
  Administered 2020-02-25: 9 ug/min via INTRAVENOUS
  Administered 2020-02-26: 23 ug/min via INTRAVENOUS
  Administered 2020-02-26: 10 ug/min via INTRAVENOUS
  Administered 2020-02-26: 9 ug/min via INTRAVENOUS
  Administered 2020-02-26: 25 ug/min via INTRAVENOUS
  Administered 2020-02-26: 8 ug/min via INTRAVENOUS
  Filled 2020-02-25 (×3): qty 250

## 2020-02-25 MED ORDER — ROCURONIUM BROMIDE 10 MG/ML (PF) SYRINGE: 30.0000 mg | PREFILLED_SYRINGE | Freq: Once | INTRAVENOUS | Status: AC

## 2020-02-25 MED ORDER — FUROSEMIDE 10 MG/ML IJ SOLN
80.0000 mg | Freq: Three times a day (TID) | INTRAMUSCULAR | Status: AC
  Administered 2020-02-25 (×2): 80 mg via INTRAVENOUS
  Filled 2020-02-25 (×2): qty 8

## 2020-02-25 MED ORDER — FENTANYL CITRATE (PF) 100 MCG/2ML IJ SOLN: 100.0000 ug | Freq: Once | INTRAMUSCULAR | Status: AC

## 2020-02-25 MED ORDER — FENTANYL 2500MCG IN NS 250ML (10MCG/ML) PREMIX INFUSION
0.0000 ug/h | INTRAVENOUS | Status: DC
  Administered 2020-02-25: 75 ug/h via INTRAVENOUS
  Administered 2020-02-26 (×2): 225 ug/h via INTRAVENOUS
  Administered 2020-02-27: 200 ug/h via INTRAVENOUS
  Administered 2020-02-27: 300 ug/h via INTRAVENOUS
  Filled 2020-02-25 (×5): qty 250

## 2020-02-25 MED ORDER — MIDAZOLAM HCL 2 MG/2ML IJ SOLN
INTRAMUSCULAR | Status: AC
  Administered 2020-02-25: 2 mg via INTRAVENOUS
  Filled 2020-02-25: qty 4

## 2020-02-25 MED ORDER — MIDAZOLAM HCL 2 MG/2ML IJ SOLN: 2.0000 mg | Freq: Once | INTRAMUSCULAR | Status: AC

## 2020-02-25 MED ORDER — NOREPINEPHRINE 4 MG/250ML-% IV SOLN
INTRAVENOUS | Status: AC
  Administered 2020-02-25: 2 ug/min via INTRAVENOUS
  Filled 2020-02-25: qty 250

## 2020-02-25 NOTE — Progress Notes (Signed)
eLink Physician-Brief Progress Note Patient Name: MAURICO PERRELL DOB: 1965/11/20 MRN: [id number]   Date of Service  02/25/2020  HPI/Events of Note  Review of CXR reveals ETT tip to be about 6 cm above the carina.   eICU Interventions  ETT position is acceptable.      Intervention Category Major Interventions: Other:  [name] 02/25/2020, 8:36 PM

## 2020-02-25 NOTE — Progress Notes (Signed)
NAME:  [name], MRN:  [id number], DOB:  1965/06/12, LOS: 5 ADMISSION DATE:  2020-03-16, CONSULTATION DATE: 9/23 REFERRING MD: Dr. Criss Alvine, CHIEF COMPLAINT: COVID-19 pneumonia  Brief History   54 yo male developed URI symptoms on 9/20 associated with nausea and vomiting.  He presented to the ER with dyspnea.  He has not been vaccinated for COVID.  Found to have hypotension, AKI, hypoxia, lactic acidosis, elevated LFT.  Found to have COVID 19 pneumonia and elevated procalcitonin concerning for bacterial superinfection.  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  Past Medical History  Asthma, CAD, Depression, Chronic pain, pre-DM, GI bleeding, Headache, HTN, Sarcoidosis, Chorioretinal inflammation  Significant Hospital Events   9/23 Admit - defer remdesivir in setting of elevated LFTs - defer baricitinib in setting of positive procalcitonin and possibility of bacterial infection 9/24 start insulin gtt 9/27 (over weekend synopses) requiring high flow oxygen. Renal fxn worse (nephro consulted) Lokelma given for on-going Hyperkalemia.  9/28 still having sig episodes of desaturation taking placement on NIPPV to recover. Becomes obtunded off BIPAP but recovers fairly rapidly once back on it  Consults:  Nephrology  Procedures:    Significant Diagnostic Tests:   Abdominal ultrasound 9/24 >> normal renal size, no hydronephrosis, no gallstones  Echo 9/26 >> EF 60 to 65%, grade 1 DD, normal RV function, mild LA dilation, aortic root and proximal ascending aorta are well seen and normal in caliber  Micro Data:  COVID 9/23 >> positive MRSA Screen 9/23 >> negative Legionella Ag 9/24 >> Pneumococcal Ag 9/24 >> negative Blood 9/23 >> HIV 9/23 >> non reactive  Antimicrobials/COVID therapy:  Zyvox 9/23 >> 9/24 Cefepime 9/23 >>  Solumedrol 9/23 >>    Interim history/subjective:  Wants something to drink  Objective   Blood pressure (Abnormal)  157/83, pulse 89, temperature 98.1 F (36.7 C), temperature source Axillary, resp. rate (Abnormal) 25, height 5\' 10"  (1.778 m), weight (Abnormal) 228.6 kg, SpO2 (Abnormal) 79 %.    Vent Mode: BIPAP;PCV FiO2 (%):  [100 %] 100 % Set Rate:  [8 bmp-12 bmp] 8 bmp PEEP:  [10 cmH20-12 cmH20] 12 cmH20   Intake/Output Summary (Last 24 hours) at 02/25/2020 02/17/2020 Last data filed at 02/25/2020 02/17/2020 Gross per 24 hour  Intake 1801.48 ml  Output 1650 ml  Net 151.48 ml   Filed Weights   03-16-2020 1900 02/21/20 0605 02/22/20 0600  Weight: (Abnormal) 226.8 kg (Abnormal) 223.6 kg (Abnormal) 228.6 kg    Examination: General this is a obese 54 year old black male. He is currently on BIPAP w/ sig accessory use whenever he is attempting to do any level of exertion HENT BIPAP mask in place Pulm RR 30s-40s + accessory use actually collapsing this BIPAP seal when he does any level of exertion. Decreased t/o Card RRR abd obese + bowel sounds dec t/o Neuro currently awake, cooperative. Seems oriented but the BIPAP mask and oxygen requirements make assessing this difficult.  GU cnc yellow Ext dependent edema w/ extensive chronic venous stasis changes  Resolved Hospital Problem list   Severe sepsis, Lactic acidosis Constipation  Assessment & Plan:   Acute hypoxic respiratory failure 2nd to COVID 19 pneumonia. Worse. Nothing else to offer except time and support  Plan Day 6 solumedrol (tapering slowly effective today) Cont to encourage positional changes Cont pulse ox; supplemental oxygen (high flow and mask) alternating w/ vent (I think he will require intubation in next 24 hrs) Checking lactic acid (if elevated would support concern that  his respiratory system is failing in addition to repeat abg)  Bacterial pneumonia superinfection. - bacterial agent clinically undetermined Plan Day 6 of 7 cefepime   Hx of asthma. Plan Singulair and PRN B-agonist   Hx of obstructive sleep apnea. Plan BIPAP as  needed  Acute renal failure from volume depletion. Anion gap metabolic acidosis with lactic acidosis. Hyperkalemia from acidosis. Hyponatremia in setting of hyperglycemia. -has been getting Lokelma  -his acid base, creatinine and K are all a little better. ->does not need HD currently. IF he were to worsen would need CRRT  Plan Renal dose meds Cont NaCl at 50cc/hr Serial chemistries  If ends up intubated Will place HD cath as well  Mediastinal widening on CXR 9/26. - Echo unremarkable; Clinically not c/w aortic dissection  - not stable enough and too big to do CT scan in [facility] Plan Cont to trend clinically   Not candidate for any intervention   Elevated troponin from demand ischemia. Hx of CAD, HTN, HLD. Plan Cont asa, zocor and low dose beta blocker Holding cozaar and lasix  Cont tele   Acute metabolic encephalopathy from renal failure, hypoxia, sepsis. Hx of chronic pain. - holding outpt adderall, buprenorphine, oxycodone, tramadol Plan Very low dose PRN fent  DM type 2 poorly controlled with steroid induced hyperglycemia. - HbA1C 8.8 from 9/24 - changed to resistant scale of SSI on 9/25 -still w/ poor control Plan Cont ssi w/ HS coverage (resistant scale) Cont meal time coverage Cont lantus at 25 bid Dec steroid dosing   Hx of peripheral focal chorioretinal inflammation. - followed by ophthalmology at [facility] by Dr. Gae Bon Plan Holding imuran for now   Elevated LFTs. - abdominal ultrasound unrevealing Plan Am LFTs   Morbid obesity with BMI 67.44 Plan Wt loss if survives  Deconditioning. Able to stand and pivot to recliner w/ min assist as of 9/26 Plan Needs extensive PT/OT     Best practice:  Diet: carb modified, heart healthy DVT prophylaxis: SQ heparin GI prophylaxis: protonix Mobility: OOB to chair as able Code Status: full code Disposition: ICU  My cct 32 min  Simonne Martinet ACNP-BC [facility] Pulmonary/Critical Care Pager # [phone]  OR # [phone] if no answer

## 2020-02-25 NOTE — Procedures (Signed)
Arterial Catheter Insertion Procedure Note  [name]  [id number]  September 15, 1965  Date:02/25/20  Time:6:52 PM    Provider Performing: Glenis Smoker Walton Digilio    Procedure: Insertion of Arterial Line (67209) with US guidance (47096)   Indication(s) Blood pressure monitoring and/or need for frequent ABGs  Consent Risks of the procedure as well as the alternatives and risks of each were explained to the patient and/or caregiver.  Consent for the procedure was obtained and is signed in the bedside chart  Anesthesia None   Time Out Verified patient identification, verified procedure, site/side was marked, verified correct patient position, special equipment/implants available, medications/allergies/relevant history reviewed, required imaging and test results available.   Sterile Technique Maximal sterile technique including full sterile barrier drape, hand hygiene, sterile gown, sterile gloves, mask, hair covering, sterile ultrasound probe cover (if used).   Procedure Description Area of catheter insertion was cleaned with chlorhexidine and draped in sterile fashion. Without real-time ultrasound guidance an arterial catheter was placed into the left radial artery.  Appropriate arterial tracings confirmed on monitor.     Complications/Tolerance None; patient tolerated the procedure well.   EBL Minimal   Specimen(s) None

## 2020-02-25 NOTE — Progress Notes (Signed)
[facility] Progress Note  Subjective:   had 1.8 liters UOP over 9/27.  Kayexalate ordered but never charted as given. k better.  Has been short of breath and on bipap.  Pt confirms he would want dialysis if indicated and we discussed may need soon.  Per pulm note if he is intubated they will place HD catheter and discussed this with pt.   Review of systems:   Short of breath Denies n/v Denies chest pain   Vitals:   02/25/20 1000 02/25/20 1048 02/25/20 1100 02/25/20 1116  BP: (!) 150/74  98/70   Pulse: 93 96 98 96  Resp: (!) 34 (!) 29 14 (!) 23  Temp:      TempSrc:      SpO2: (!) 87% (!) 82% (!) 76% (!) 82%  Weight:      Height:        Exam: Gen marked obesity on bipap ill appearing NCAT  Chest coarse breath sounds  S1S2 no rub Abd soft nt obese habitus Ext trace lower extremity edema  Neuro is alert and interactive, nf, no asterixis; follows commands Psych normal mood and affect  Home meds:  - asa 81/ lasix 40 qd/ losartan 50 qd/ metoprolol xl 50 qd/ zocor 40 hs  - ultram 50 qid prn/ oxycodone 20 qid prn/ buprenorphine 1 patch weekly/ adderall 10 tid prn  - imuran 50 mg / pred 10 qd  - meclizine 25 qid prn  - singulair 10 hs/ protonix 40 qd  - prn's/ vitamins/ supplements     UNa 38,  UCr 164   UA pending   Abd [place] - 11 cm kidneys w/o hydro or stones     CXR 9/24 - IMPRESSION: 1. Low lung volumes. Persistent bibasilar infiltrates without interim change. Small bilateral pleural effusions cannot be excluded. 2. Stable cardiomegaly. No pulmonary venous congestion  Assessment/ Plan: 1. AKI - creat in 2019 was normal. Presumably this is AKI from COVID infection and/or hypotension/ ischemic ATN.  US shows normal appearing kidneys. Creat 7.6 on admit > peaked at 8.0 on 9/24.  downtrending Cr. BUN up with steroids.   1. Renal willing to perform RRT when/if needed and patient has consented.  Would need line and CRRT only option at [facility]  2. Hopeful for  continued improvement in Cr.  3. Lasix 80 mg IV today every 8 hours x 2 doses Asked RN to let me know if he is intubated today - note plans per pulm for HD cath if intubated 2. Acute hypoxic resp failure - 2/2 covid.  Optimize volume with lasix as above 3. Hyperkalemia - renal diet, s/p lokelma  4. COVID PNA - sig bilat infiltrates per CXR, high flow oxygen  5. H/o HTN - on arb/ BB at home per charting 6. Sarcoidosis - on imuran/ pred at home 7. Morbid obesity - complicates volume assessment    Recent Labs  Lab 02/24/20 0815 02/24/20 2049 02/24/20 2356 02/25/20 0251  K 5.9*   < > 5.1 5.1  5.1  BUN 117*  --   --  109*  CREATININE 6.48*  --   --  5.55*  CALCIUM 8.8*  --   --  8.6*  PHOS 5.6*  --   --  4.8*  HGB 13.8  --   --  12.5*   < > = values in this interval not displayed.   Inpatient medications: . (feeding supplement) PROSource Plus  30 mL Oral QID  . aspirin EC  81 mg Oral Daily  . Chlorhexidine Gluconate Cloth  6 each Topical Daily  . feeding supplement ([employer] STANDARD 1.4)  325 mL Oral Daily  . heparin  5,000 Units Subcutaneous Q8H  . insulin aspart  0-20 Units Subcutaneous TID WC  . insulin aspart  0-5 Units Subcutaneous QHS  . insulin aspart  5 Units Subcutaneous TID WC  . insulin glargine  25 Units Subcutaneous BID  . mouth rinse  15 mL Mouth Rinse BID  . methylPREDNISolone (SOLU-MEDROL) injection  40 mg Intravenous Q12H  . metoprolol tartrate  2.5 mg Intravenous Q8H  . montelukast  10 mg Oral QHS  . pantoprazole  40 mg Oral Daily  . simvastatin  40 mg Oral QHS  . sodium chloride flush  3 mL Intravenous Q12H  . sodium polystyrene  30 g Oral Once   . sodium chloride 50 mL/hr at 02/25/20 0733  . ceFEPime (MAXIPIME) IV Stopped (02/25/20 1115)   fentaNYL (SUBLIMAZE) injection, labetalol, ondansetron (ZOFRAN) IV   Estanislado Emms, MD 02/25/2020 12:19 PM

## 2020-02-25 NOTE — Progress Notes (Signed)
eLink Physician-Brief Progress Note Patient Name: [name] DOB: [birth year] MRN: [id number]   Date of Service  02/25/2020  HPI/Events of Note  Review of CXR for L IJ CVL placement reveals L IJ CVP tip in the lower SVC and no pneumothorax. Review of abdominal film for Panda tube placement reveals Panda tube tip in proximal stomach.   eICU Interventions  Plan: 1. OK to use L IJ CVL.  2. Would advance Panda tube another 8 cm and repeat abdominal film.     Intervention Category Major Interventions: Other:  [name] 02/25/2020, 11:23 PM

## 2020-02-25 NOTE — Progress Notes (Signed)
Called to update daughter  [name] to [employer]

## 2020-02-25 NOTE — Progress Notes (Signed)
eLink Physician-Brief Progress Note Patient Name: [name] DOB: March 22, 1966 MRN: [id number]   Date of Service  02/25/2020  HPI/Events of Note  ABG on 100%/PRVC 22 (patient breathing at 27)/TV 470/P 16 = 7.308/49.4/48.9  eICU Interventions  Plan: 1. Increase PRVC rate to 34 and PEEP to 18. 2. Repeat ABG at 8:45 PM.      Intervention Category Major Interventions: Hypoxemia - evaluation and management;Respiratory failure - evaluation and management  [name] 02/25/2020, 7:34 PM

## 2020-02-25 NOTE — Progress Notes (Signed)
PT Cancellation Note / Sign off  Patient Details Name: RAFIQ BUCKLIN MRN: [id number] DOB: 1965/08/07   Cancelled Treatment:    Reason Eval/Treat Not Completed: Patient not medically ready  RN reports she assisted pt with rolling this morning.  Pt requiring BiPAP at this time.  Per critical care note today: "I think he will require intubation in next 24 hrs."  Discussed with RN and will sign off as pt has not been medically ready for a few days.  Please re-order when pt able to participate.  Thanks.   Emma Schupp,KATHrine E 02/25/2020, 12:10 PM Paulino Door, DPT Acute Rehabilitation Services Pager: [phone] Office: [phone]

## 2020-02-25 NOTE — Progress Notes (Signed)
Patient continued to deteriorate  Mental status decline Continuing desaturations Increase BiPAP settings for higher pressures, patient increasingly uncomfortable with BiPAP pressures with desaturations into the 70s with no improvement with BiPAP changes  Decision made to intubate patient and place him on a ventilator  Successfully intubated with a size 8 endotracheal tube  Triple-lumen catheter placed left IJ  Tolerated procedure well

## 2020-02-25 NOTE — Procedures (Signed)
Intubation Procedure Note  [name]  [id number]  12-25-1965  Date:02/25/20  Time:6:36 PM   Provider Performing:[name]    Procedure: Intubation (31500)  Indication(s) Respiratory Failure  Consent Unable to obtain consent due to emergent nature of procedure.   Anesthesia Etomidate, Versed, Fentanyl and Rocuronium  20 etomidate, 2 of Versed, 100 of fentanyl, 30 of rocuronium  Time Out Verified patient identification, verified procedure, site/side was marked, verified correct patient position, special equipment/implants available, medications/allergies/relevant history reviewed, required imaging and test results available.   Sterile Technique Usual hand hygeine, masks, and gloves were used   Procedure Description Patient positioned in bed supine.  Sedation given as noted above.  Patient was intubated with endotracheal tube using Glidescope.  View was Grade 1 full glottis .  Number of attempts was 1.  Colorimetric CO2 detector was consistent with tracheal placement.   Complications/Tolerance None; patient tolerated the procedure well. Chest X-ray is ordered to verify placement.   EBL None   Specimen(s) None

## 2020-02-25 NOTE — Progress Notes (Signed)
At 1700 CCM MD ordered for intubation, panda placement, central line, and A line to be placed. Time out was conducted, protocol for all procedures done and currently waiting for bedside XR to confirm placement on ETT, Panda, and CVC. Will continue to monitor patient

## 2020-02-25 NOTE — Procedures (Signed)
Central Venous Catheter Insertion Procedure Note  [name]  [id number]  01-21-1966  Date:02/25/20  Time:6:37 PM   Provider Performing:Aubrey Voong A [name]   Procedure: Insertion of Non-tunneled Central Venous Catheter(36556) with US guidance (32023)   Indication(s) Medication administration and Difficult access  Consent Unable to obtain consent due to emergent nature of procedure.  Anesthesia Topical only with 1% lidocaine   Timeout Verified patient identification, verified procedure, site/side was marked, verified correct patient position, special equipment/implants available, medications/allergies/relevant history reviewed, required imaging and test results available.  Sterile Technique Maximal sterile technique including full sterile barrier drape, hand hygiene, sterile gown, sterile gloves, mask, hair covering, sterile ultrasound probe cover (if used).  Procedure Description Area of catheter insertion was cleaned with chlorhexidine and draped in sterile fashion.  With real-time ultrasound guidance a central venous catheter was placed into the left internal jugular vein. Nonpulsatile blood flow and easy flushing noted in all ports.  The catheter was sutured in place and sterile dressing applied.  Complications/Tolerance None; patient tolerated the procedure well. Chest X-ray is ordered to verify placement for internal jugular or subclavian cannulation.   Chest x-ray is not ordered for femoral cannulation.  EBL   Specimen(s) None

## 2020-02-26 ENCOUNTER — Inpatient Hospital Stay (HOSPITAL_COMMUNITY): Payer: Medicare Other

## 2020-02-26 LAB — CBC
HCT: 41.1 % (ref 39.0–52.0)
Hemoglobin: 12.6 g/dL — ABNORMAL LOW (ref 13.0–17.0)
MCH: 30.3 pg (ref 26.0–34.0)
MCHC: 30.7 g/dL (ref 30.0–36.0)
MCV: 98.8 fL (ref 80.0–100.0)
Platelets: 143 10*3/uL — ABNORMAL LOW (ref 150–400)
RBC: 4.16 MIL/uL — ABNORMAL LOW (ref 4.22–5.81)
RDW: 12.9 % (ref 11.5–15.5)
WBC: 10.2 10*3/uL (ref 4.0–10.5)
nRBC: 1 % — ABNORMAL HIGH (ref 0.0–0.2)

## 2020-02-26 LAB — MAGNESIUM: Magnesium: 2.3 mg/dL (ref 1.7–2.4)

## 2020-02-26 LAB — RENAL FUNCTION PANEL
Albumin: 2.2 g/dL — ABNORMAL LOW (ref 3.5–5.0)
Anion gap: 15 (ref 5–15)
BUN: 77 mg/dL — ABNORMAL HIGH (ref 6–20)
CO2: 23 mmol/L (ref 22–32)
Calcium: 8.7 mg/dL — ABNORMAL LOW (ref 8.9–10.3)
Chloride: 109 mmol/L (ref 98–111)
Creatinine, Ser: 4.68 mg/dL — ABNORMAL HIGH (ref 0.61–1.24)
GFR calc Af Amer: 15 mL/min — ABNORMAL LOW (ref >60)
GFR calc non Af Amer: 13 mL/min — ABNORMAL LOW (ref >60)
Glucose, Bld: 148 mg/dL — ABNORMAL HIGH (ref 70–99)
Phosphorus: 6.4 mg/dL — ABNORMAL HIGH (ref 2.5–4.6)
Potassium: 5.5 mmol/L — ABNORMAL HIGH (ref 3.5–5.1)
Sodium: 147 mmol/L — ABNORMAL HIGH (ref 135–145)

## 2020-02-26 LAB — GLUCOSE, CAPILLARY
Glucose-Capillary: 175 mg/dL — ABNORMAL HIGH (ref 70–99)
Glucose-Capillary: 219 mg/dL — ABNORMAL HIGH (ref 70–99)
Glucose-Capillary: 243 mg/dL — ABNORMAL HIGH (ref 70–99)
Glucose-Capillary: 232 mg/dL — ABNORMAL HIGH (ref 70–99)
Glucose-Capillary: 215 mg/dL — ABNORMAL HIGH (ref 70–99)

## 2020-02-26 LAB — BLOOD GAS, ARTERIAL
Acid-base deficit: 5.3 mmol/L — ABNORMAL HIGH (ref 0.0–2.0)
Bicarbonate: 23.4 mmol/L (ref 20.0–28.0)
FIO2: 100
MECHVT: 470 mL
O2 Saturation: 69.8 %
PEEP: 22 cmH2O
Patient temperature: 98.1
RATE: 34 resp/min
pCO2 arterial: 62 mmHg — ABNORMAL HIGH (ref 32.0–48.0)
pH, Arterial: 7.2 — ABNORMAL LOW (ref 7.350–7.450)
pO2, Arterial: 47.8 mmHg — ABNORMAL LOW (ref 83.0–108.0)

## 2020-02-26 LAB — HEPATIC FUNCTION PANEL
ALT: 35 U/L (ref 0–44)
AST: 44 U/L — ABNORMAL HIGH (ref 15–41)
Albumin: 2.2 g/dL — ABNORMAL LOW (ref 3.5–5.0)
Alkaline Phosphatase: 50 U/L (ref 38–126)
Bilirubin, Direct: 1.2 mg/dL — ABNORMAL HIGH (ref 0.0–0.2)
Indirect Bilirubin: 0.6 mg/dL (ref 0.3–0.9)
Total Bilirubin: 1.8 mg/dL — ABNORMAL HIGH (ref 0.3–1.2)
Total Protein: 7 g/dL (ref 6.5–8.1)

## 2020-02-26 LAB — PHOSPHORUS: Phosphorus: 6.3 mg/dL — ABNORMAL HIGH (ref 2.5–4.6)

## 2020-02-26 MED ORDER — SIMVASTATIN 40 MG PO TABS
40.0000 mg | ORAL_TABLET | Freq: Every day | ORAL | Status: DC
  Administered 2020-02-26: 40 mg
  Filled 2020-02-26: qty 1

## 2020-02-26 MED ORDER — CHLORHEXIDINE GLUCONATE 0.12% ORAL RINSE (MEDLINE KIT)
15.0000 mL | Freq: Two times a day (BID) | OROMUCOSAL | Status: DC
  Administered 2020-02-26 – 2020-02-27 (×2): 15 mL via OROMUCOSAL

## 2020-02-26 MED ORDER — FUROSEMIDE 10 MG/ML IJ SOLN: 40.0000 mg | Freq: Once | INTRAMUSCULAR | Status: DC

## 2020-02-26 MED ORDER — ORAL CARE MOUTH RINSE
15.0000 mL | OROMUCOSAL | Status: DC
  Administered 2020-02-26 – 2020-02-27 (×11): 15 mL via OROMUCOSAL

## 2020-02-26 MED ORDER — KATE FARMS STANDARD 1.4 PO LIQD
325.0000 mL | Freq: Every day | ORAL | Status: DC
  Filled 2020-02-26: qty 325

## 2020-02-26 MED ORDER — ASPIRIN 81 MG PO CHEW
81.0000 mg | CHEWABLE_TABLET | Freq: Every day | ORAL | Status: DC
  Administered 2020-02-26 – 2020-02-27 (×2): 81 mg
  Filled 2020-02-26 (×2): qty 1

## 2020-02-26 MED ORDER — DEXTROSE 5 % IV SOLN: INTRAVENOUS | Status: DC

## 2020-02-26 MED ORDER — SODIUM CHLORIDE 0.9 % IV SOLN
0.0000 ug/kg/min | INTRAVENOUS | Status: DC
  Administered 2020-02-26 – 2020-02-27 (×5): 3 ug/kg/min via INTRAVENOUS
  Filled 2020-02-26 (×9): qty 20

## 2020-02-26 MED ORDER — ARTIFICIAL TEARS OPHTHALMIC OINT
1.0000 "application " | TOPICAL_OINTMENT | Freq: Three times a day (TID) | OPHTHALMIC | Status: DC
  Administered 2020-02-26 – 2020-02-27 (×5): 1 via OPHTHALMIC
  Filled 2020-02-26: qty 3.5

## 2020-02-26 MED ORDER — NOREPINEPHRINE 16 MG/250ML-% IV SOLN
0.0000 ug/min | INTRAVENOUS | Status: DC
  Administered 2020-02-26: 25 ug/min via INTRAVENOUS
  Filled 2020-02-26 (×4): qty 250

## 2020-02-26 MED ORDER — MIDODRINE HCL 5 MG PO TABS
10.0000 mg | ORAL_TABLET | Freq: Three times a day (TID) | ORAL | Status: DC
  Administered 2020-02-26 – 2020-02-27 (×4): 10 mg
  Filled 2020-02-26 (×3): qty 2

## 2020-02-26 MED ORDER — SODIUM ZIRCONIUM CYCLOSILICATE 10 G PO PACK
10.0000 g | PACK | Freq: Once | ORAL | Status: AC
  Administered 2020-02-26: 10 g
  Filled 2020-02-26: qty 1

## 2020-02-26 MED ORDER — FUROSEMIDE 10 MG/ML IJ SOLN
80.0000 mg | Freq: Once | INTRAMUSCULAR | Status: AC
  Administered 2020-02-26: 80 mg via INTRAVENOUS
  Filled 2020-02-26: qty 8

## 2020-02-26 MED ORDER — VECURONIUM BROMIDE 10 MG IV SOLR: 10.0000 mg | Freq: Once | INTRAVENOUS | Status: AC

## 2020-02-26 MED ORDER — PANTOPRAZOLE SODIUM 40 MG PO PACK
40.0000 mg | PACK | Freq: Every day | ORAL | Status: DC
  Administered 2020-02-26 – 2020-02-27 (×2): 40 mg
  Filled 2020-02-26 (×3): qty 20

## 2020-02-26 MED ORDER — VECURONIUM BROMIDE 10 MG IV SOLR
INTRAVENOUS | Status: AC
  Administered 2020-02-26: 10 mg via INTRAVENOUS
  Filled 2020-02-26: qty 10

## 2020-02-26 MED ORDER — INSULIN ASPART 100 UNIT/ML ~~LOC~~ SOLN
0.0000 [IU] | SUBCUTANEOUS | Status: DC
  Administered 2020-02-26 – 2020-02-27 (×6): 7 [IU] via SUBCUTANEOUS
  Administered 2020-02-27: 4 [IU] via SUBCUTANEOUS

## 2020-02-26 MED ORDER — VECURONIUM BOLUS VIA INFUSION: 0.0800 mg/kg | Freq: Once | INTRAVENOUS | Status: DC

## 2020-02-26 MED ORDER — FUROSEMIDE 10 MG/ML IJ SOLN
4.0000 mg/h | INTRAVENOUS | Status: DC
  Administered 2020-02-26: 4 mg/h via INTRAVENOUS
  Filled 2020-02-26: qty 25

## 2020-02-26 NOTE — Progress Notes (Signed)
[facility] Progress Note  Subjective:   had 1.9 liters UOP over 9/28.  Renal function has been improving but patient was intubated on 9/28 after clinical decline.  Note in interim he was ordered for a lasix gtt at 4 mg/hr.  He had a foley and had 650 out upon placement per nursing.  Has been on levo at 26 mcg/min.  Per nursing they are about to prone him  Review of systems:   Unable to obtain 2/2 intubated sedated    Vitals:   02/26/20 1130 02/26/20 1200 02/26/20 1230 02/26/20 1237  BP:  130/62    Pulse: (!) 107 99 99   Resp: (!) 34 (!) 34 (!) 34   Temp:  98.7 F (37.1 C)    TempSrc:  Axillary    SpO2: (!) 81% (!) 83% (!) 83% (!) 83%  Weight:      Height:        Exam: Gen adult male obese habitus intubated   Chest coarse mechanical breath sounds  Tachycardia S1S2 no rub Abd soft obese habitus Ext trace lower extremity edema  Neuro sedated and paralyzed.  GU foley catheter in place  Home meds:  - asa 81/ lasix 40 qd/ losartan 50 qd/ metoprolol xl 50 qd/ zocor 40 hs  - ultram 50 qid prn/ oxycodone 20 qid prn/ buprenorphine 1 patch weekly/ adderall 10 tid prn  - imuran 50 mg / pred 10 qd  - meclizine 25 qid prn  - singulair 10 hs/ protonix 40 qd  - prn's/ vitamins/ supplements     UNa 38,  UCr 164   UA pending   Abd [place] - 11 cm kidneys w/o hydro or stones     CXR 9/24 - IMPRESSION: 1. Low lung volumes. Persistent bibasilar infiltrates without interim change. Small bilateral pleural effusions cannot be excluded. 2. Stable cardiomegaly. No pulmonary venous congestion  Assessment/ Plan: 1. AKI - creat in 2019 was normal. Presumably this is AKI from COVID infection and/or hypotension/ ischemic ATN.  US shows normal appearing kidneys. Creat 7.6 on admit > peaked at 8.0 on 9/24.  downtrending Cr. BUN up with steroids.   1. Reviewed pulm notes  2. Hopeful for continued improvement in Cr.  3. Lasix 80 mg IV once and stop lasix gtt 2. Acute hypoxic resp  failure - 2/2 covid. Vent per critical care.  Optimize volume with lasix as above 3. Hyperkalemia - repeat lokelma; for lasix 4. COVID PNA - sig bilat infiltrates per CXR 5. Hypernatremia - D5at 50 ml/hr x 12 hours 6. H/o HTN - on arb/ BB at home per charting 7. Sarcoidosis - on imuran/ pred at home 8. Morbid obesity - complicates volume assessment    Recent Labs  Lab 02/25/20 0251 02/26/20 0237  K 5.1  5.1 5.5*  BUN 109* 77*  CREATININE 5.55* 4.68*  CALCIUM 8.6* 8.7*  PHOS 4.8* 6.4*  6.3*  HGB 12.5* 12.6*   Inpatient medications: . artificial tears  1 application Both Eyes Q8H  . aspirin  81 mg Per Tube Daily  . Chlorhexidine Gluconate Cloth  6 each Topical Daily  . heparin  5,000 Units Subcutaneous Q8H  . insulin aspart  0-20 Units Subcutaneous Q4H  . insulin glargine  25 Units Subcutaneous BID  . mouth rinse  15 mL Mouth Rinse BID  . methylPREDNISolone (SOLU-MEDROL) injection  40 mg Intravenous Q12H  . midodrine  10 mg Per Tube TID  . pantoprazole sodium  40 mg Per Tube  Daily  . simvastatin  40 mg Per Tube QHS  . sodium chloride flush  3 mL Intravenous Q12H   . sodium chloride 50 mL/hr at 02/26/20 1200  . cisatracurium (NIMBEX) infusion 3 mcg/kg/min (02/26/20 1200)  . fentaNYL infusion INTRAVENOUS 225 mcg/hr (02/26/20 1200)  . furosemide (LASIX) infusion 4 mg/hr (02/26/20 1200)  . norepinephrine (LEVOPHED) Adult infusion 26 mcg/min (02/26/20 1200)  . propofol (DIPRIVAN) infusion 70 mcg/kg/min (02/26/20 1222)   fentaNYL (SUBLIMAZE) injection, ondansetron (ZOFRAN) IV   Estanislado Emms, MD 02/26/2020 1:21 PM

## 2020-02-26 NOTE — Progress Notes (Addendum)
Patient doing very poorly  Very dyssynchronous with the vent This may be driving the significant desaturations at present  PEEP elevated, saturations just above 70% Increasing need for pressors Increasing need for sedation  We will give him 1 dose of paralytic to see if this helps Will likely require proning  Overall outlook is very poor

## 2020-02-26 NOTE — Progress Notes (Signed)
eLink Physician-Brief Progress Note Patient Name: [name] DOB: 1966/04/08 MRN: [id number]   Date of Service  02/26/2020  HPI/Events of Note  Review of abdominal film reveals Panda tube tip in distal stomach. Panda tube position is acceptable.   eICU Interventions  OK to use Panda tube.      Intervention Category Major Interventions: Other:  [name] 02/26/2020, 5:54 AM

## 2020-02-26 NOTE — TOC Initial Note (Signed)
Transition of Care [facility]) - Initial/Assessment Note    Patient Details  Name: [name] MRN: [id number] Date of Birth: 1966-02-24  [facility] CM/SW Contact:    Golda Acre, RN Phone Number: 02/26/2020, 10:16 AM  Clinical Narrative:                 54 yo male developed URI symptoms on 9/20 associated with nausea and vomiting.  He presented to the ER with dyspnea.  He has not been vaccinated for COVID.  Found to have hypotension, AKI, hypoxia, lactic acidosis, elevated LFT.  Found to have COVID 19 pneumonia and elevated procalcitonin concerning for bacterial superinfection.  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  Past Medical History  Asthma, CAD, Depression, Chronic pain, pre-DM, GI bleeding, Headache, HTN, Sarcoidosis, Chorioretinal inflammation  Significant Hospital Events   9/23 Admit - defer remdesivir in setting of elevated LFTs - defer baricitinib in setting of positive procalcitonin and possibility of bacterial infection 9/24 start insulin gtt 9/27 (over weekend synopses) requiring high flow oxygen. Renal fxn worse (nephro consulted) Lokelma given for on-going Hyperkalemia.  9/28 still having sig episodes of desaturation taking placement on NIPPV to recover. Becomes obtunded off BIPAP but recovers fairly rapidly once back on it in the afternoon was intubated due to rapid clinical decline 9/29: Progressive requirements of FiO2 and PEEP, chest x-ray consistent with ARDS, requiring 100% FiO2, also 22 of PEEP.  Plateau pressures currently 42, driving pressure 18.. Discussed with family.  Made DO NOT RESUSCITATE based on futility.  Still continuing aggressive support otherwise including attempt at proning   Expected Discharge Plan: Home/Self Care Barriers to Discharge: Continued Medical Work up   Patient Goals and CMS Choice Patient states their goals for this hospitalization and ongoing recovery are:: unable to  state CMS Medicare.gov Compare Post Acute Care list provided to:: Patient    Expected Discharge Plan and Services Expected Discharge Plan: Home/Self Care   Discharge Planning Services: CM Consult   Living arrangements for the past 2 months: Apartment                                      Prior Living Arrangements/Services Living arrangements for the past 2 months: Apartment Lives with:: Self                   Activities of Daily Living Home Assistive Devices/Equipment: Eyeglasses ADL Screening (condition at time of admission) Patient's cognitive ability adequate to safely complete daily activities?: Yes Is the patient deaf or have difficulty hearing?: No Does the patient have difficulty seeing, even when wearing glasses/contacts?: No Does the patient have difficulty concentrating, remembering, or making decisions?: No Patient able to express need for assistance with ADLs?: Yes Does the patient have difficulty dressing or bathing?: No Independently performs ADLs?: Yes (appropriate for developmental age) Does the patient have difficulty walking or climbing stairs?: Yes (secondary to shortness of breath and weakness) Weakness of Legs: Both Weakness of Arms/Hands: None  Permission Sought/Granted                  Emotional Assessment Appearance:: Appears stated age Attitude/Demeanor/Rapport: Unable to Assess Affect (typically observed): Unable to Assess Orientation: : Fluctuating Orientation (Suspected and/or reported Sundowners) Alcohol / Substance Use: Not Applicable Psych Involvement: No (comment)  Admission diagnosis:  Acute respiratory failure with hypoxia (HCC) [J96.01] Pneumonia due to COVID-19  virus [U07.1, J12.82] COVID-19 [U07.1] Patient Active Problem List   Diagnosis Date Noted  . Acute renal failure (ARF) (HCC)   . ARDS (adult respiratory distress syndrome) (HCC)   . COVID-19 02/03/2020  . Iritis 03/30/2017  . Primary osteoarthritis of  both knees 03/30/2017  . Osteoarthritis of right knee 12/04/2014  . Left ventricular hypertrophy 06/04/2012  . History of migraine headaches 06/04/2012  . Coronary atherosclerosis 06/16/2010  . VENTRICULAR TACHYCARDIA, PAROXYSMAL 06/11/2010  . ACUTE PROSTATITIS 12/03/2009  . ABDOMINAL PAIN, GENERALIZED 12/03/2009  . HELICOBACTER PYLORI [H. PYLORI] INFECTION 09/07/2009  . ANXIETY DEPRESSION 12/18/2008  . HEADACHE 08/04/2008  . ABDOMINAL PAIN 11/20/2007  . LEG PAIN, BILATERAL 09/13/2007  . ASTHMA 06/14/2007  . LOW BACK PAIN 06/14/2007  . Glucose intolerance (impaired glucose tolerance) 06/14/2007  . Dyslipidemia 04/10/2007  . MORBID OBESITY 04/10/2007  . SARCOIDOSIS 10/05/2006  . Essential hypertension 10/05/2006   PCP:  Gordy Savers, MD (Inactive) Pharmacy:   [facility] [phone] [place], [place] - [phone] AT [facility] [address] [place] [address] Phone: [phone] Fax: [phone]     Social Determinants of Health (SDOH) Interventions    Readmission Risk Interventions No flowsheet data found.

## 2020-02-26 NOTE — Progress Notes (Signed)
Pt ETT advanced 2 cm going from 23 at the lip to 25 at the lip per MD verbal order.

## 2020-02-26 NOTE — Progress Notes (Signed)
Pt proned at 1435, no complications noted, tolerating well at this time. Vitals stable, RT will continue to monitor.

## 2020-02-26 NOTE — Progress Notes (Signed)
Patient has continued to decline during the night. We have increased his sedation to keep sync with vent and increased pressor to maintain BP. However even on 100% and 22 peep patient sat are in low 70s. Continued contact with Elink orders received.

## 2020-02-26 NOTE — Progress Notes (Signed)
02/26/2020 Page sent to Dr. Wynona Neat via Amion secure page - current status PEEP 22, O2 sats 69-70. UOP 240cc x 12 hrs w/Lasix. Contact number [phone]. Will continue to monitor. Cindy S. Clelia Croft BSN, RN, [facility] 02/26/2020 7:31 AM

## 2020-02-26 NOTE — Progress Notes (Signed)
eLink Physician-Brief Progress Note Patient Name: BURNICE OESTREICHER DOB: 08-21-1965 MRN: [id number]   Date of Service  02/26/2020  HPI/Events of Note  Hypoxia - Sat = 72% Patient already on 100%/P 20. Review of CXR from this AM reveals no pneumothorax and worsening of bilateral pulmonary opacities.   eICU Interventions  Plan: 1. Increase PEEP to 22.      Intervention Category Major Interventions: Hypoxemia - evaluation and management  Kimiye Strathman Eugene 02/26/2020, 6:21 AM

## 2020-02-26 NOTE — Progress Notes (Signed)
RT Note: Pts. head turned from facing L to R along with arm positioning done with RN, no complications.

## 2020-02-26 NOTE — Progress Notes (Addendum)
NAME:  [name], MRN:  [id number], DOB:  Jan 02, 1966, LOS: 6 ADMISSION DATE:  02/08/2020, CONSULTATION DATE: 9/23 REFERRING MD: Dr. Criss Alvine, CHIEF COMPLAINT: COVID-19 pneumonia  Brief History   54 yo male developed URI symptoms on 9/20 associated with nausea and vomiting.  He presented to the ER with dyspnea.  He has not been vaccinated for COVID.  Found to have hypotension, AKI, hypoxia, lactic acidosis, elevated LFT.  Found to have COVID 19 pneumonia and elevated procalcitonin concerning for bacterial superinfection.  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  Past Medical History  Asthma, CAD, Depression, Chronic pain, pre-DM, GI bleeding, Headache, HTN, Sarcoidosis, Chorioretinal inflammation  Significant Hospital Events   9/23 Admit - defer remdesivir in setting of elevated LFTs - defer baricitinib in setting of positive procalcitonin and possibility of bacterial infection 9/24 start insulin gtt 9/27 (over weekend synopses) requiring high flow oxygen. Renal fxn worse (nephro consulted) Lokelma given for on-going Hyperkalemia.  9/28 still having sig episodes of desaturation taking placement on NIPPV to recover. Becomes obtunded off BIPAP but recovers fairly rapidly once back on it in the afternoon was intubated due to rapid clinical decline 9/29: Progressive requirements of FiO2 and PEEP, chest x-ray consistent with ARDS, requiring 100% FiO2, also 22 of PEEP.  Plateau pressures currently 42, driving pressure 18.. Discussed with family.  Made DO NOT RESUSCITATE based on futility.  Still continuing aggressive support otherwise including attempt at proning Consults:  Nephrology  Procedures:    Significant Diagnostic Tests:   Abdominal ultrasound 9/24 >> normal renal size, no hydronephrosis, no gallstones  Echo 9/26 >> EF 60 to 65%, grade 1 DD, normal RV function, mild LA dilation, aortic root and proximal ascending aorta are well seen and  normal in caliber  Micro Data:  COVID 9/23 >> positive MRSA Screen 9/23 >> negative Legionella Ag 9/24 >> Pneumococcal Ag 9/24 >> negative Blood 9/23 >> HIV 9/23 >> non reactive  Antimicrobials/COVID therapy:  Zyvox 9/23 >> 9/24 Cefepime 9/23 >>  Solumedrol 9/23 >>    Interim history/subjective:  Heavily sedated Objective   Blood pressure (Abnormal) 141/68, pulse (Abnormal) 106, temperature 98.6 F (37 C), temperature source Axillary, resp. rate (Abnormal) 34, height 5\' 10"  (1.778 m), weight (Abnormal) 228.6 kg, SpO2 (Abnormal) 76 %.    Vent Mode: PRVC FiO2 (%):  [100 %] 100 % Set Rate:  [8 bmp-34 bmp] 34 bmp Vt Set:  [470 mL] 470 mL PEEP:  [8 cmH20-22 cmH20] 22 cmH20 Plateau Pressure:  [29 cmH20-31 cmH20] 29 cmH20   Intake/Output Summary (Last 24 hours) at 02/26/2020 0826 Last data filed at 02/26/2020 02/28/2020 Gross per 24 hour  Intake 3598.06 ml  Output 1650 ml  Net 1948.06 ml   Filed Weights   02/19/2020 1900 02/21/20 0605 02/22/20 0600  Weight: (Abnormal) 226.8 kg (Abnormal) 223.6 kg (Abnormal) 228.6 kg    Examination: General this is a 54 year old black male. Now heavily sedated and on intermittent NMB; current gtts: norepi 49mcg/min; fent: 247mcg/hr prop 70 HENT orally intubated. Left IJ CVL MMM Pulm decreased t/o current vent settings 470/rr22 (o over) peep 22 fio2 100% pplat 40; driving pressure 18; sats 80% Card RRR ST on tele abd soft Ext chronic venous stasis changes. Cool but pulses palp Neuro sedated. PERRL BIS 50 GU cnc yellow  Resolved Hospital Problem list   Severe sepsis, Lactic acidosis Constipation  Anion gap metabolic acidosis  Assessment & Plan:   Acute hypoxic respiratory failure 2nd to  COVID 19 pneumonia and ARDS. He is on maximum support.  Lung mechanics are terrible, pulse oximetry in the 80s, exhibiting both hypoxic in hypercarbic respiratory failure little more to offer Plan Continue low tidal volume ventilation  Continue heavy sedation  with RASS goal -5 initiating neuromuscular blockade  Inc rr  P plat goal less than 30/driving pressure goal less than 15/PaO2 goal 55-65 excepting pulse oximetry of greater than 85%  Initiate proning protocol, perhaps this will improve ventilator mechanics  Lasix gtt started Day #7 Solu-Medrol will hold at this current dosing  VAP bundle  A.m. chest x-ray   Drug related hypotension Plan Midodrine and norepi for MAP > 65  Bacterial pneumonia superinfection. - bacterial agent clinically undetermined Plan Day #7 of 7 cefepime  Hx of asthma. Plan Continue as needed beta agonist   Acute renal failure from volume depletion Hyperkalemia from acidosis. Hypernatremia -Renal function surprisingly has continued to improve, Still has borderline hyperkalemia, now hypernatremic Plan Continue to renal dose medications Serial chemistries IV lasix gtt  Mean arterial pressure goal greater than 65 I do not think he is a reasonable candidate for dialysis should he deteriorate further  Mediastinal widening on CXR 9/26. - Echo unremarkable; Clinically not c/w aortic dissection  - not stable enough and too big to do CT scan in [facility] Plan Monitor, not a candidate for intervention  Elevated troponin from demand ischemia. Hx of CAD, HTN, HLD. Plan Continuing aspirin and Zocor  Beta-blockade on hold due to hypotension, holding Cozaar and Lasix  Continue telemetry monitoring   Acute metabolic encephalopathy from renal failure, hypoxia, sepsis. Hx of chronic pain. - holding outpt adderall, buprenorphine, oxycodone, tramadol Plan PAD protocol as mentioned above  DM type 2 poorly controlled with steroid induced hyperglycemia. - HbA1C 8.8 from 9/24 - changed to resistant scale of SSI on 9/25 -still w/ poor control Plan Continue every 4 sliding scale insulin Hold basal insulin for now other than the Lantus Continue Lantus 25 units twice daily    Hx of peripheral focal chorioretinal  inflammation. - followed [facility]mology [facility] by Dr. Gae Bon Plan Holding imuran for now   Elevated LFTs. - abdominal ultrasound unrevealing Plan A.m. LFTs  Morbid obesity with BMI 67.44 Plan Weight loss if he survives this  Deconditioning. Able to stand and pivot to recliner w/ min assist as of 9/26 Plan Needs extensive PT and OT    Best practice:  Diet: carb modified, heart healthy DVT prophylaxis: SQ heparin GI prophylaxis: protonix Mobility: Bedrest Code Status: DNR Disposition: ICU  45 minutes critical care time  Simonne Martinet ACNP-BC [facility] Pulmonary/Critical Care Pager # [phone] OR # [phone] if no answer

## 2020-02-27 LAB — COMPREHENSIVE METABOLIC PANEL
ALT: 43 U/L (ref 0–44)
AST: 120 U/L — ABNORMAL HIGH (ref 15–41)
Albumin: 1.9 g/dL — ABNORMAL LOW (ref 3.5–5.0)
Alkaline Phosphatase: 61 U/L (ref 38–126)
Anion gap: 11 (ref 5–15)
BUN: 125 mg/dL — ABNORMAL HIGH (ref 6–20)
CO2: 22 mmol/L (ref 22–32)
Calcium: 7.9 mg/dL — ABNORMAL LOW (ref 8.9–10.3)
Chloride: 104 mmol/L (ref 98–111)
Creatinine, Ser: 5.84 mg/dL — ABNORMAL HIGH (ref 0.61–1.24)
GFR calc Af Amer: 11 mL/min — ABNORMAL LOW (ref >60)
GFR calc non Af Amer: 10 mL/min — ABNORMAL LOW (ref >60)
Glucose, Bld: 233 mg/dL — ABNORMAL HIGH (ref 70–99)
Potassium: 6.8 mmol/L (ref 3.5–5.1)
Sodium: 137 mmol/L (ref 135–145)
Total Bilirubin: 1.4 mg/dL — ABNORMAL HIGH (ref 0.3–1.2)
Total Protein: 6.6 g/dL (ref 6.5–8.1)

## 2020-02-27 LAB — GLUCOSE, CAPILLARY
Glucose-Capillary: 216 mg/dL — ABNORMAL HIGH (ref 70–99)
Glucose-Capillary: 203 mg/dL — ABNORMAL HIGH (ref 70–99)
Glucose-Capillary: 198 mg/dL — ABNORMAL HIGH (ref 70–99)

## 2020-02-27 LAB — CBC
HCT: 43.6 % (ref 39.0–52.0)
Hemoglobin: 12.5 g/dL — ABNORMAL LOW (ref 13.0–17.0)
MCH: 30 pg (ref 26.0–34.0)
MCHC: 28.7 g/dL — ABNORMAL LOW (ref 30.0–36.0)
MCV: 104.6 fL — ABNORMAL HIGH (ref 80.0–100.0)
Platelets: 103 10*3/uL — ABNORMAL LOW (ref 150–400)
RBC: 4.17 MIL/uL — ABNORMAL LOW (ref 4.22–5.81)
RDW: 12.8 % (ref 11.5–15.5)
WBC: 12.2 10*3/uL — ABNORMAL HIGH (ref 4.0–10.5)
nRBC: 0.6 % — ABNORMAL HIGH (ref 0.0–0.2)

## 2020-02-27 MED ORDER — SODIUM CHLORIDE 0.9% FLUSH: 10.0000 mL | INTRAVENOUS | Status: DC | PRN

## 2020-02-27 MED ORDER — SODIUM CHLORIDE 0.9% FLUSH
10.0000 mL | Freq: Two times a day (BID) | INTRAVENOUS | Status: DC
  Administered 2020-02-27: 10 mL

## 2020-02-27 MED ORDER — INSULIN ASPART 100 UNIT/ML ~~LOC~~ SOLN
5.0000 [IU] | SUBCUTANEOUS | Status: DC
  Administered 2020-02-27 (×2): 5 [IU] via SUBCUTANEOUS

## 2020-02-27 MED ORDER — SODIUM ZIRCONIUM CYCLOSILICATE 10 G PO PACK
10.0000 g | PACK | Freq: Two times a day (BID) | ORAL | Status: DC
  Administered 2020-02-27: 10 g
  Filled 2020-02-27: qty 1

## 2020-02-27 MED ORDER — CHLORHEXIDINE GLUCONATE CLOTH 2 % EX PADS: 6.0000 | MEDICATED_PAD | Freq: Every day | CUTANEOUS | Status: DC

## 2020-02-28 NOTE — Progress Notes (Signed)
NAME:  [name], MRN:  [id number], DOB:  Apr 06, 1966, LOS: 7 ADMISSION DATE:  02/13/2020, CONSULTATION DATE: 9/23 REFERRING MD: Dr. Criss Alvine, CHIEF COMPLAINT: COVID-19 pneumonia  Brief History   54 yo male developed URI symptoms on 9/20 associated with nausea and vomiting.  He presented to the ER with dyspnea.  He has not been vaccinated for COVID.  Found to have hypotension, AKI, hypoxia, lactic acidosis, elevated LFT.  Found to have COVID 19 pneumonia and elevated procalcitonin concerning for bacterial superinfection.  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  Past Medical History  Asthma, CAD, Depression, Chronic pain, pre-DM, GI bleeding, Headache, HTN, Sarcoidosis, Chorioretinal inflammation  Significant Hospital Events   9/23 Admit - defer remdesivir in setting of elevated LFTs - defer baricitinib in setting of positive procalcitonin and possibility of bacterial infection 9/24 start insulin gtt 9/27 (over weekend synopses) requiring high flow oxygen. Renal fxn worse (nephro consulted) Lokelma given for on-going Hyperkalemia.  9/28 still having sig episodes of desaturation taking placement on NIPPV to recover. Becomes obtunded off BIPAP but recovers fairly rapidly once back on it in the afternoon was intubated due to rapid clinical decline 9/29: Progressive requirements of FiO2 and PEEP, chest x-ray consistent with ARDS, requiring 100% FiO2, also 22 of PEEP.  Plateau pressures currently 42, driving pressure 18.. Discussed with family.  Made DO NOT RESUSCITATE based on futility.  Still continuing aggressive support otherwise including attempt at proning 9/30: tolerated prone positioning but no improvement in vent. Mechanics. On higher pressors. Renal fxn worse.  No further escalation.  Consults:  Nephrology  Procedures:    Significant Diagnostic Tests:   Abdominal ultrasound 9/24 >> normal renal size, no hydronephrosis, no  gallstones  Echo 9/26 >> EF 60 to 65%, grade 1 DD, normal RV function, mild LA dilation, aortic root and proximal ascending aorta are well seen and normal in caliber  Micro Data:  COVID 9/23 >> positive MRSA Screen 9/23 >> negative Legionella Ag 9/24 >> Pneumococcal Ag 9/24 >> negative Blood 9/23 >> HIV 9/23 >> non reactive  Antimicrobials/COVID therapy:  Zyvox 9/23 >> 9/24 Cefepime 9/23 >>  Solumedrol 9/23 >>    Interim history/subjective:  Prone position  Objective   Blood pressure (Abnormal) 131/57, pulse 98, temperature 98.3 F (36.8 C), temperature source Oral, resp. rate (Abnormal) 34, height 5\' 10"  (1.778 m), weight (Abnormal) 228.6 kg, SpO2 91 %.    Vent Mode: PRVC FiO2 (%):  [100 %] 100 % Set Rate:  [34 bmp] 34 bmp Vt Set:  [470 mL] 470 mL PEEP:  [22 cmH20] 22 cmH20 Plateau Pressure:  [40 cmH20-41 cmH20] 40 cmH20   Intake/Output Summary (Last 24 hours) at 2020-02-28 0842 Last data filed at February 28, 2020 0800 Gross per 24 hour  Intake 6061.6 ml  Output 1775 ml  Net 4286.6 ml   Filed Weights   02/26/2020 1900 02/21/20 0605 02/22/20 0600  Weight: (Abnormal) 226.8 kg (Abnormal) 223.6 kg (Abnormal) 228.6 kg    Examination: General this is a 54 year old black male; now prone. Currently on prop gtt at 61mcg/kg/min, fent 200 mcg/min, nimbex gtt (BIS 30s) hent orally intubated pulm dec bilat currently fio1 100% peep 22, rr 34 vt 470 pplat 41 driving pressure  Card tachy regular (currently on 32 mcg/min) abd soft  gu cnc yellow hourly UOP dropping Neuro sedated and on NMB Ext chronic venous stasis changes  Resolved Hospital Problem list   Severe sepsis, Lactic acidosis Constipation  Anion gap metabolic  acidosis PNA (NOS) completed rx 9/29 Elevated LFTs Hypernatremia  Assessment & Plan:   Acute hypoxic respiratory failure 2nd to COVID 19 pneumonia and ARDS. Still on high peep/fio2/ vent mechanics not improved Plan Cont low Vt ventilation rass goal -5 and cont  NMB gtt Cont prop and fent infusions BIS goal < 60 pplat goal <30/driving pressure goal < 15 pao2 goal 55-65 Titrate peep/fio2 to achieve if able cxr after supine  Repeat abg after supine  Will dc proning protocol. Did not improve vent mechanics  Cont solumedrol at current although does not seem as though it has helped Daily assessment for lasix  VAP bundle  circulatory shock. Think a mix of sedation and acidosis  Plan Midodrine and norepi for MAP > 65   Hx of asthma. Plan PRN albuterol    Acute renal failure from volume depletion Hyperkalemia from acidosis. -Renal function and K had improved. Now worse again.  Plan Hold lasix today abg Lokelma via tube Repeat K this afternoon->I do not think he is a candidate for HD as he will not survive this illness  Mediastinal widening on CXR 9/26. Plan Nothing to do  Elevated troponin from demand ischemia. Hx of CAD, HTN, HLD. Plan zocor and asa Holding bb, cozaar and lasix Cont tele  Acute metabolic encephalopathy from renal failure, hypoxia, sepsis. Hx of chronic pain. - holding outpt adderall, buprenorphine, oxycodone, tramadol Plan PAD protocol as above   DM type 2 poorly controlled with steroid induced hyperglycemia. - HbA1C 8.8 from 9/24 -still w/ poor control Plan Cont lantus 25 bid Cont ssi q 4 Add basal dosing of short acting q4y    Hx of peripheral focal chorioretinal inflammation. - followed by ophthalmology at [facility] by Dr. Gae Bon Plan No imuran for now   Deconditioning. Plan PT/OT if survives.     Best practice:  Diet: carb modified, heart healthy DVT prophylaxis: SQ heparin GI prophylaxis: protonix Mobility: Bedrest Code Status: DNR Disposition: ICU He is getting worse as to be expected. Any further escalation (pressors, CRRT) would be futile. Will cont current rx but no further proning. Will update family later today   45 minutes critical care time  Simonne Martinet ACNP-BC [facility]  Pulmonary/Critical Care Pager # [phone] OR # [phone] if no answer

## 2020-02-28 NOTE — Progress Notes (Signed)
Hey looks like he is actively dying. I have called his daughter and informed her that if she wants an opportunity to say goodbye to their father.   Plan No further escalation  Full dnr prob will pass away today   Simonne Martinet ACNP-BC [facility] Pulmonary/Critical Care Pager # [phone] OR # [phone] if no answer

## 2020-02-28 NOTE — Progress Notes (Signed)
I met w/ [name]'s son and daughter. He has not responded to any of our therapy and has continued to decline in spite of all of our efforts.  They are in agreement to proceed w/ focus on comfort.   Plan Cont full vent support  Dc NMB DC levophed when family is ready No more labs  Complete comfort care. He will pass away on ventilator.   Erick Colace ACNP-BC [place] Pager # [phone] OR # [phone] if no answer

## 2020-02-28 NOTE — Progress Notes (Signed)
Patient arterial line wave form dampened and reading in low 80s. Cuff pressure results 124/52. Line zeroed and flushed. To monitor off cuff pressures for shift.

## 2020-02-28 NOTE — Progress Notes (Signed)
[place] notified of TOD. Referral [phone]. Full release to funeral home.

## 2020-02-28 NOTE — Progress Notes (Signed)
[facility] Progress Note  Subjective:   He has been on 40 mcg/min of levo.  Critical care has called family in for a meeting per his nurse.   Review of systems:   Unable to obtain 2/2 intubated sedated    Vitals:   2020/03/15 0900 Mar 15, 2020 1000 Mar 15, 2020 1100 03-15-2020 1200  BP: 135/66 132/66 (!) 107/45 (!) 102/53  Pulse: 100 (!) 102 (!) 102 (!) 102  Resp:      Temp:    98.6 F (37 C)  TempSrc:    Oral  SpO2: 94% (!) 88% 93% (!) 79%  Weight:      Height:        Exam: Gen adult male obese habitus intubated   Chest coarse mechanical breath sounds; FIO2 100 and PEEP 22  Tachycardia S1S2 Abd soft obese habitus Ext trace lower extremity edema  Neuro he is sedated and does not respond to exam   GU foley catheter in place  Home meds:  - asa 81/ lasix 40 qd/ losartan 50 qd/ metoprolol xl 50 qd/ zocor 40 hs  - ultram 50 qid prn/ oxycodone 20 qid prn/ buprenorphine 1 patch weekly/ adderall 10 tid prn  - imuran 50 mg / pred 10 qd  - meclizine 25 qid prn  - singulair 10 hs/ protonix 40 qd  - prn's/ vitamins/ supplements     UNa 38,  UCr 164   UA pending   Abd [place] - 11 cm kidneys w/o hydro or stones     CXR 9/24 - IMPRESSION: 1. Low lung volumes. Persistent bibasilar infiltrates without interim change. Small bilateral pleural effusions cannot be excluded. 2. Stable cardiomegaly. No pulmonary venous congestion  Assessment 1. AKI - creat in 2019 was normal. Presumably this is AKI from COVID infection and/or hypotension/ ischemic ATN.  US shows normal appearing kidneys. Creat 7.6 on admit > peaked at 8.0 on 9/24.  Critically ill and now with worsening renal failure with dialysis felt to be futile  2. Acute hypoxic resp failure - 2/2 covid.  On mechanical ventilation 3. Hyperkalemia 4. COVID PNA - sig bilat infiltrates per CXR 5. Hypernatremia 6. Septic shock 7. Sarcoidosis - on imuran/ pred at home 8. Morbid obesity - complicates volume assessment   Plan Spoke  with critical care who feel as though further escalation of care would be futile.  They had a family meeting and the patient's family is coming in to see him and they will withdraw care.  Appreciate staff support of the patient and his family at this difficult time.  Please do not hesitate to contact me if I can be of any assistance.   Recent Labs  Lab 02/25/20 0251 02/25/20 0251 02/26/20 0237 03/15/20 0514  K 5.1  5.1   < > 5.5* 6.8*  BUN 109*   < > 77* 125*  CREATININE 5.55*   < > 4.68* 5.84*  CALCIUM 8.6*   < > 8.7* 7.9*  PHOS 4.8*  --  6.4*  6.3*  --   HGB 12.5*   < > 12.6* 12.5*   < > = values in this interval not displayed.   Inpatient medications: . artificial tears  1 application Both Eyes A1O  . aspirin  81 mg Per Tube Daily  . chlorhexidine gluconate (MEDLINE KIT)  15 mL Mouth Rinse BID  . Chlorhexidine Gluconate Cloth  6 each Topical Daily  . heparin  5,000 Units Subcutaneous Q8H  . insulin aspart  0-20 Units Subcutaneous Q4H  .  insulin aspart  5 Units Subcutaneous Q4H  . insulin glargine  25 Units Subcutaneous BID  . mouth rinse  15 mL Mouth Rinse 10 times per day  . methylPREDNISolone (SOLU-MEDROL) injection  40 mg Intravenous Q12H  . midodrine  10 mg Per Tube TID  . pantoprazole sodium  40 mg Per Tube Daily  . sodium chloride flush  10-40 mL Intracatheter Q12H  . sodium chloride flush  3 mL Intravenous Q12H  . sodium zirconium cyclosilicate  10 g Per Tube BID   . sodium chloride 50 mL/hr at 03-25-2020 1200  . cisatracurium (NIMBEX) infusion Stopped (03-25-20 1119)  . fentaNYL infusion INTRAVENOUS 200 mcg/hr (March 25, 2020 1200)  . norepinephrine (LEVOPHED) Adult infusion 40 mcg/min (2020/03/25 1200)  . propofol (DIPRIVAN) infusion 40 mcg/kg/min (03-25-20 1423)   fentaNYL (SUBLIMAZE) injection, sodium chloride flush   Claudia Desanctis, MD 03-25-2020 3:16 PM

## 2020-02-28 DEATH — deceased

## 2020-03-30 NOTE — Discharge Summary (Signed)
DEATH SUMMARY   Patient Details  Name: [name] MRN: [id number] DOB: Oct 18, 1965  Admission/Discharge Information   Admit Date:  02-23-2020  Date of Death: Date of Death: 03/01/2020  Time of Death: Time of Death: 09/16/1803  Length of Stay: 7  Referring Physician: Gordy Savers, MD (Inactive)   Reason(s) for Hospitalization  Patient presented to the hospital with shortness of breath, found to be hypotensive with acute kidney injury  Diagnoses  Preliminary cause of death:  COVID-19 pneumonia Acute hypoxic respiratory failure Secondary Diagnoses (including complications and co-morbidities):  Active Problems:   COVID-19   Acute renal failure (ARF) (HCC)   ARDS (adult respiratory distress syndrome) (HCC) Severe sepsis Coronary artery disease Hypertension Hyperlipidemia   Brief Hospital Course (including significant findings, care, treatment, and services provided and events leading to death)  [name] is a 54 y.o. year old male who was admitted to the hospital on February 23, 2023 with weakness, shortness of breath, nausea and vomiting about 3 days duration Found to have Covid pneumonia with concern for superadded infection.   Could not receive remdesivir secondary to elevated liver function test, baricitinib could not be used in the context of possible bacterial infection Progressive deterioration with increasing oxygen requirement, NIPPV was tried but patient continued to deteriorate.  On 9/28 was intubated for respiratory failure. 9/29 discussions with family regarding very rapid deterioration despite aggressive support, poor ventilator tolerance and inability to maintain his oxygenation.  Ventilator mechanics did not improve with proning and use of paralytics. Following further discussion with the family patient was made a DO NOT RESUSCITATE With progressive deterioration further discussion with the family and he was made comfort measures. Patient succumbed to his illness  on 03-02-2023 at 1805 hrs.   Pertinent Labs and Studies  Significant Diagnostic Studies DG Abd 1 View  Result Date: 02/26/2020 CLINICAL DATA:  NG tube placement EXAM: ABDOMEN - 1 VIEW COMPARISON:  02/25/2020 FINDINGS: Enteric tube has been advanced with tip in the right upper quadrant consistent with location in the distal stomach. Bilateral pulmonary infiltrates. Paucity of gas in the visualized abdomen. IMPRESSION: Enteric tube tip is in the right upper quadrant consistent with location in the distal stomach. Electronically Signed   By: Burman Nieves M.D.   On: 02/26/2020 03:12   DG Abd 1 View  Result Date: 02/25/2020 CLINICAL DATA:  NG tube placement. EXAM: ABDOMEN - 1 VIEW COMPARISON:  None. FINDINGS: Weighted enteric tube in place with tip below the diaphragm in the left upper quadrant, in the region of the proximal stomach. No bowel dilatation in the upper abdomen. IMPRESSION: Weighted enteric tube with tip below the diaphragm in the left upper quadrant, in the region of the proximal stomach. Electronically Signed   By: Narda Rutherford M.D.   On: 02/25/2020 20:44   US Abdomen Complete  Result Date: 02/21/2020 CLINICAL DATA:  Acute renal failure EXAM: ABDOMEN ULTRASOUND COMPLETE COMPARISON:  CT abdomen pelvis 12/16/2009 FINDINGS: Gallbladder: No gallstones or wall thickening visualized. No sonographic Murphy sign noted by sonographer. Common bile duct: Diameter: Not visualized Liver: Increased echogenicity liver without focal liver lesion. Portal vein is patent on color Doppler imaging with normal direction of blood flow towards the liver. IVC: Not visualized Pancreas: Not visualized Spleen: Size and appearance within normal limits. Right Kidney: Length: 11.5 cm. Echogenicity within normal limits. No mass or hydronephrosis visualized. Left Kidney: Length: 11.0 cm. Echogenicity within normal limits. No mass or hydronephrosis visualized. Abdominal aorta: Not visualized Other findings: Exam  quality  limited due to body habitus and bowel gas. IMPRESSION: Normal renal size bilaterally.  No hydronephrosis. Negative for gallstones Limited exam. Electronically Signed   By: Marlan Palau M.D.   On: 02/21/2020 11:04   DG Chest Port 1 View  Result Date: 02/26/2020 CLINICAL DATA:  ARDS EXAM: PORTABLE CHEST 1 VIEW COMPARISON:  02/25/2020 FINDINGS: Endotracheal and NG tubes and left central venous catheter appear unchanged in position. Cardiac enlargement. Bilateral perihilar and basilar pulmonary infiltrates with air bronchograms, increasing since yesterday. No pleural effusion. No pneumothorax. IMPRESSION: Increasing bilateral perihilar and basilar pulmonary infiltrates with air bronchograms. Electronically Signed   By: Burman Nieves M.D.   On: 02/26/2020 03:59   DG CHEST PORT 1 VIEW  Result Date: 02/25/2020 CLINICAL DATA:  Endotracheal tube placement. EXAM: PORTABLE CHEST 1 VIEW COMPARISON:  Radiograph yesterday. FINDINGS: Endotracheal tube tip the level of the clavicular heads. Enteric tube in place coursing below the field of view. Left internal jugular central venous catheter tip in the lower SVC. There is no evidence of pneumothorax. Low lung volumes. Increasing patchy bilateral lung opacities from exam yesterday. Stable heart size and mediastinal contours. IMPRESSION: 1. Endotracheal tube tip at the level of the clavicular heads. Left internal jugular central venous catheter tip in the lower SVC. 2. Increasing patchy bilateral lung opacities from exam yesterday, likely worsening COVID pneumonia. Electronically Signed   By: Narda Rutherford M.D.   On: 02/25/2020 20:43   DG Chest Port 1 View  Result Date: 02/24/2020 CLINICAL DATA:  Mediastinal widening.  COVID. EXAM: PORTABLE CHEST 1 VIEW COMPARISON:  Chest x-ray 02/22/2020, 02/21/2020, 21-Feb-2020. Chest CT 02/10/2011. FINDINGS: Previously identified mediastinal widening is less prominent on today's exam and may have reflected overlying right upper  lung atelectasis/infiltrate. A PA and lateral chest x-ray is suggested for further evaluation. If mediastinal widening remains contrast-enhanced chest CT again suggested for further evaluation. Cardiomegaly. Diffuse bilateral pulmonary infiltrates/edema again noted. Bibasilar atelectasis. No prominent pleural effusion. No pneumothorax. IMPRESSION: 1. Previously identified mediastinal widening is less prominent on today's exam and may have reflected overlying right upper lung atelectasis/infiltrate. A PA and lateral chest x-ray is suggested for further evaluation. If mediastinal widening remains contrast-enhanced chest CT again suggested for further evaluation. 2. Diffuse bilateral pulmonary infiltrates again noted without interim change. Bibasilar atelectasis again noted. Electronically Signed   By: Maisie Fus  Register   On: 02/24/2020 05:35   DG CHEST PORT 1 VIEW  Result Date: 02/22/2020 CLINICAL DATA:  Mediastinal widening, repeat chest radiograph EXAM: PORTABLE CHEST 1 VIEW COMPARISON:  Radiographs 02/21/2020, 02/22/2020 FINDINGS: Redemonstrated opacity along the right paramediastinal margin which is contiguous with the right paratracheal stripe suggesting the abnormality is within the mediastinum itself. Remaining cardiomediastinal contours are similar to prior with stable enlargement of the cardiac silhouette. There are low lung volumes with bilateral effusions and diffuse hazy interstitial and patchy opacities throughout both lungs similar to mildly increased from the comparison radiograph. No pneumothorax. IMPRESSION: 1. Redemonstrated widening of the upper mediastinum. Could reflect aortic aneurysm, dissection or other process favored to be within the mediastinum given the contiguous thickening of the lower right paratracheal stripe. CT angiography could be obtained for further evaluation following patient stabilization. 2. Unchanged cardiomegaly. 3. Slightly increasing hazy interstitial and patchy  opacities throughout both lungs with bilateral effusions. Electronically Signed   By: Kreg Shropshire M.D.   On: 02/22/2020 23:38   DG Chest Port 1 View  Addendum Date: 02/22/2020   ADDENDUM REPORT: 02/22/2020 22:36 ADDENDUM: These  results were called by telephone at the time of interpretation on 02/22/2020 at 10:36 pm to provider [facility] , who verbally acknowledged these results. Electronically Signed   By: Katherine Mantle M.D.   On: 02/22/2020 22:36   Addendum Date: 02/22/2020   ADDENDUM REPORT: 02/22/2020 22:29 ADDENDUM: Correction, the impression should say emergent CTA is recommended. Electronically Signed   By: Katherine Mantle M.D.   On: 02/22/2020 22:29   Result Date: 02/22/2020 CLINICAL DATA:  Hypoxemia EXAM: PORTABLE CHEST 1 VIEW COMPARISON:  02/21/2020 FINDINGS: Again noted is low lung volumes with there are hazy airspace opacities bilaterally which appear to have worsened since the prior study especially at the right lung base. There is no convincing pneumothorax. There is a new density in the right paratracheal region. The heart remains enlarged. IMPRESSION: 1. Apparent new widening of the upper mediastinum. Emergent CT a is recommended to help exclude a dissection. 2. Persistent diffuse hazy bilateral airspace opacities with worsening since the prior study. 3. Cardiomegaly. Electronically Signed: By: Katherine Mantle M.D. On: 02/22/2020 22:26   DG Chest Port 1 View  Result Date: 02/21/2020 CLINICAL DATA:  COVID. EXAM: PORTABLE CHEST 1 VIEW COMPARISON:  02/19/2020. FINDINGS: Stable cardiomegaly. No pulmonary venous congestion. Low lung volumes. Bibasilar pulmonary infiltrates are again noted without interim change. Small bilateral pleural effusions cannot be excluded. No pneumothorax. IMPRESSION: 1. Low lung volumes. Persistent bibasilar infiltrates without interim change. Small bilateral pleural effusions cannot be excluded. 2.  Stable cardiomegaly.  No pulmonary venous congestion  Electronically Signed   By: Maisie Fus  Register   On: 02/21/2020 07:26   DG Chest Port 1 View  Result Date: 01/29/2020 CLINICAL DATA:  COVID positive, weakness, dizziness EXAM: PORTABLE CHEST 1 VIEW COMPARISON:  02/09/2011 FINDINGS: Bilateral lower lung mixed interstitial and airspace opacities compatible with pneumonia related to COVID. These changes slightly worse in the right lower lobe. Sparing of the lung apices. No effusion or pneumothorax. Heart is enlarged. Exam is limited because of body habitus and portable technique. IMPRESSION: Bibasilar pneumonia pattern compatible with COVID pneumonia. Electronically Signed   By: Judie Petit.  Shick M.D.   On: 02/01/2020 13:44   ECHOCARDIOGRAM COMPLETE  Result Date: 02/23/2020    ECHOCARDIOGRAM REPORT   Patient Name:   DENIRO LAYMON Date of Exam: 02/23/2020 Medical Rec #:  [id number]          Height:       70.0 in Accession #:    [id number]         Weight:       504.0 lb Date of Birth:  Oct 09, 1965           BSA:          3.092 m Patient Age:    54 years           BP:           166/148 mmHg Patient Gender: M                  HR:           89 bpm. Exam Location:  Inpatient Procedure: 2D Echo, Color Doppler, Cardiac Doppler and Intracardiac            Opacification Agent STAT ECHO Indications:    Rule out aortic dissection  History:        Patient has prior history of Echocardiogram examinations, most  recent 03/30/2012. CAD; Risk Factors:COVID+ 02/25/2020, Hypertension                 and Dyslipidemia.  Sonographer:    Irving BurtonEmily Senior RDCS Referring Phys: [id number] Josephine IgoBRADLEY L ICARD  Sonographer Comments: Technically difficult study due to patient body habitus. IMPRESSIONS  1. Left ventricular ejection fraction, by estimation, is 60 to 65%. The left ventricle has normal function. The left ventricle has no regional wall motion abnormalities. There is moderate concentric left ventricular hypertrophy. Left ventricular diastolic parameters are consistent with Grade I  diastolic dysfunction (impaired relaxation).  2. Right ventricular systolic function is normal. The right ventricular size is normal. Tricuspid regurgitation signal is inadequate for assessing PA pressure.  3. Left atrial size was mildly dilated.  4. The mitral valve is normal in structure. No evidence of mitral valve regurgitation.  5. The aortic valve is tricuspid. Aortic valve regurgitation is not visualized. No aortic stenosis is present. Comparison(s): No prior Echocardiogram. The aortic root and proximal ascending aorta are well seen and are normal in caliber, without a visible intimal flap. The distal ascending aorta, aortic arch and descending aorta cannot be evaluated. Transthoracic echo has a very limited sensitivity for detection of aortic dissection. FINDINGS  Left Ventricle: Left ventricular ejection fraction, by estimation, is 60 to 65%. The left ventricle has normal function. The left ventricle has no regional wall motion abnormalities. Definity contrast agent was given IV to delineate the left ventricular  endocardial borders. The left ventricular internal cavity size was normal in size. There is moderate concentric left ventricular hypertrophy. Left ventricular diastolic parameters are consistent with Grade I diastolic dysfunction (impaired relaxation). Normal left ventricular filling pressure. Right Ventricle: The right ventricular size is normal. No increase in right ventricular wall thickness. Right ventricular systolic function is normal. Tricuspid regurgitation signal is inadequate for assessing PA pressure. Left Atrium: Left atrial size was mildly dilated. Right Atrium: Right atrial size was not well visualized. Pericardium: There is no evidence of pericardial effusion. Mitral Valve: The mitral valve is normal in structure. There is mild thickening of the mitral valve leaflet(s). No evidence of mitral valve regurgitation. Tricuspid Valve: The tricuspid valve is grossly normal. Tricuspid valve  regurgitation is not demonstrated. Aortic Valve: The aortic valve is tricuspid. Aortic valve regurgitation is not visualized. No aortic stenosis is present. Pulmonic Valve: The pulmonic valve was normal in structure. Pulmonic valve regurgitation is not visualized. Aorta: The aortic root and ascending aorta are structurally normal, with no evidence of dilitation. IAS/Shunts: The interatrial septum was not well visualized.  LEFT VENTRICLE PLAX 2D LVIDd:         3.80 cm  Diastology LVIDs:         2.30 cm  LV e' medial:    7.94 cm/s LV PW:         1.50 cm  LV E/e' medial:  9.8 LV IVS:        1.40 cm  LV e' lateral:   8.92 cm/s LVOT diam:     2.10 cm  LV E/e' lateral: 8.7 LV SV:         70 LV SV Index:   23 LVOT Area:     3.46 cm  RIGHT VENTRICLE RV S prime:     12.80 cm/s TAPSE (M-mode): 2.3 cm LEFT ATRIUM             Index LA diam:        4.20 cm 1.36 cm/m LA Vol (A2C):  53.9 ml 17.43 ml/m LA Vol (A4C):   79.1 ml 25.59 ml/m LA Biplane Vol: 66.3 ml 21.45 ml/m  AORTIC VALVE LVOT Vmax:   112.00 cm/s LVOT Vmean:  72.500 cm/s LVOT VTI:    0.201 m  AORTA Ao Root diam: 2.90 cm Ao Asc diam:  3.20 cm MITRAL VALVE MV Area (PHT): 1.91 cm    SHUNTS MV Decel Time: 398 msec    Systemic VTI:  0.20 m MV E velocity: 77.70 cm/s  Systemic Diam: 2.10 cm MV A velocity: 89.70 cm/s MV E/A ratio:  0.87 Mihai Croitoru MD Electronically signed by Thurmon Fair MD Signature Date/Time: 02/23/2020/8:02:22 AM    Final     Microbiology Recent Results (from the past 240 hour(s))  SARS Coronavirus 2 by RT PCR (hospital order, performed in [facility] hospital lab) Nasopharyngeal Nasopharyngeal Swab     Status: Abnormal   Collection Time: 03/11/20  1:11 PM   Specimen: Nasopharyngeal Swab  Result Value Ref Range Status   SARS Coronavirus 2 POSITIVE (A) NEGATIVE Final    Comment: RESULT CALLED TO, READ BACK BY AND VERIFIED WITH: WEST,S. RN @1423  ON Mar 11, 2020 BY COHEN,K (NOTE) SARS-CoV-2 target nucleic acids are DETECTED  SARS-CoV-2  RNA is generally detectable in upper respiratory specimens  during the acute phase of infection.  Positive results are indicative  of the presence of the identified virus, but do not rule out bacterial infection or co-infection with other pathogens not detected by the test.  Clinical correlation with patient history and  other diagnostic information is necessary to determine patient infection status.  The expected result is negative.  Fact Sheet for Patients:   09.25.2021   Fact Sheet for Healthcare Providers:   BoilerBrush.com.cy    This test is not yet approved or cleared by the https://pope.com/ FDA and  has been authorized for detection and/or diagnosis of SARS-CoV-2 by FDA under an Emergency Use Authorization (EUA).  This EUA will remain in effect (meaning  this test can be used) for the duration of  the COVID-19 declaration under Section 564(b)(1) of the Act, 21 U.S.C. section 360-bbb-3(b)(1), unless the authorization is terminated or revoked sooner.  Performed at [facility], [address] [place], [place]   Blood Culture (routine x 2)     Status: None   Collection Time: 11-Mar-2020  1:11 PM   Specimen: BLOOD  Result Value Ref Range Status   Specimen Description   Final    BLOOD RIGHT ANTECUBITAL Performed at [facility], [address], [place], [place]    Special Requests   Final    BOTTLES DRAWN AEROBIC AND ANAEROBIC Blood Culture adequate volume Performed at [facility], [address] [place], [place]    Culture   Final    NO GROWTH 5 DAYS Performed at [facility], [address] [place], [place]    Report Status 02/25/2020 FINAL  Final  MRSA PCR Screening     Status: None   Collection Time: 03-11-2020  6:47 PM   Specimen: Nasal Mucosa; Nasopharyngeal  Result Value Ref Range Status   MRSA by PCR NEGATIVE  NEGATIVE Final    Comment:        The GeneXpert MRSA Assay (FDA approved for NASAL specimens only), is one component of a comprehensive MRSA colonization surveillance program. It is not intended to diagnose MRSA infection nor to guide or monitor treatment for MRSA infections. Performed at  [facility], [address] [place], [place] [address]     Lab Basic Metabolic Panel: Recent Labs  Lab 02/24/20 0815 02/24/20 0815 02/24/20 2049 02/24/20 2356 02/25/20 0251 02/26/20 0237 03-08-20 0514  NA 139  --   --   --  144 147* 137  K 5.9*   < > 5.0 5.1 5.1  5.1 5.5* 6.8*  CL 103  --   --   --  108 109 104  CO2 20*  --   --   --  25 23 22   GLUCOSE 293*  --   --   --  196* 148* 233*  BUN 117*  --   --   --  109* 77* 125*  CREATININE 6.48*  --   --   --  5.55* 4.68* 5.84*  CALCIUM 8.8*  --   --   --  8.6* 8.7* 7.9*  MG  --   --   --   --   --  2.3  --   PHOS 5.6*  --   --   --  4.8* 6.4*  6.3*  --    < > = values in this interval not displayed.   Liver Function Tests: Recent Labs  Lab 02/24/20 0815 02/25/20 0251 02/26/20 0237 March 08, 2020 0514  AST  --   --  44* 120*  ALT  --   --  35 43  ALKPHOS  --   --  50 61  BILITOT  --   --  1.8* 1.4*  PROT  --   --  7.0 6.6  ALBUMIN 2.3* 2.3* 2.2*  2.2* 1.9*   No results for input(s): LIPASE, AMYLASE in the last 168 hours. No results for input(s): AMMONIA in the last 168 hours. CBC: Recent Labs  Lab 02/24/20 0815 02/25/20 0251 02/26/20 0237 Mar 08, 2020 0514  WBC 8.9 8.7 10.2 12.2*  HGB 13.8 12.5* 12.6* 12.5*  HCT 42.3 40.1 41.1 43.6  MCV 93.4 97.8 98.8 104.6*  PLT 138* 158 143* 103*   Cardiac Enzymes: No results for input(s): CKTOTAL, CKMB, CKMBINDEX, TROPONINI in the last 168 hours. Sepsis Labs: Recent Labs  Lab 02/24/20 0815 02/25/20 0251 02/25/20 1044 02/26/20 0237 Mar 08, 2020 0514  WBC 8.9 8.7  --  10.2 12.2*  LATICACIDVEN  --   --  1.8  --   --     Procedures/Operations  Intubation  9/28   Kanaya Gunnarson A Kaesyn Johnston 03/01/2020, 1:00 PM

## 2021-07-24 IMAGING — DX DG CHEST 1V PORT
1 series · 1 of 1 positions shown · non-contrast
Comparison: Chest x-ray 02/22/2020, 02/21/2020, 02/20/2020. Chest
CT 02/10/2011.

CLINICAL DATA: Mediastinal widening.  COVID.

EXAM:
PORTABLE CHEST 1 VIEW

[chest ap]
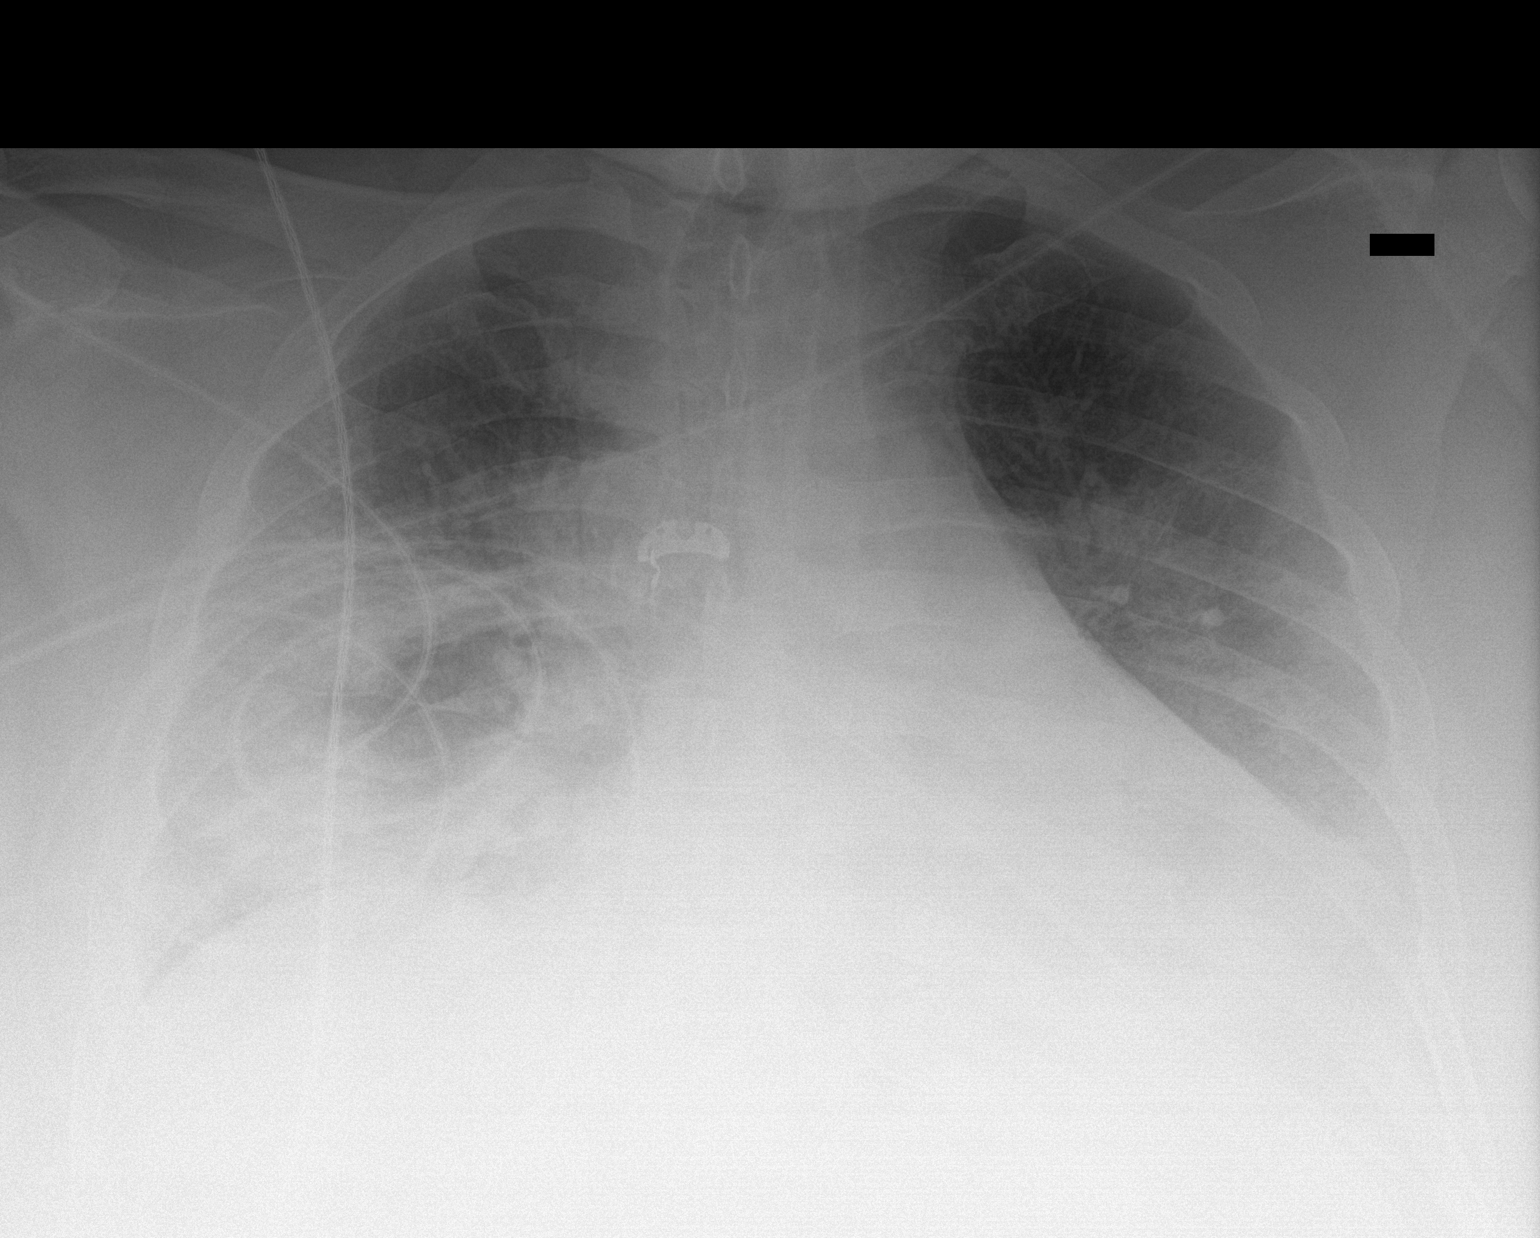

[1 of 1 positions shown; findings below may reference images not displayed]

FINDINGS: Previously identified mediastinal widening is less prominent on
today's exam and may have reflected overlying right upper lung
atelectasis/infiltrate. A PA and lateral chest x-ray is suggested
for further evaluation. If mediastinal widening remains
contrast-enhanced chest CT again suggested for further evaluation.
Cardiomegaly. Diffuse bilateral pulmonary infiltrates/edema again
noted. Bibasilar atelectasis. No prominent pleural effusion. No
pneumothorax.
IMPRESSION: 1. Previously identified mediastinal widening is less prominent on
today's exam and may have reflected overlying right upper lung
atelectasis/infiltrate. A PA and lateral chest x-ray is suggested
for further evaluation. If mediastinal widening remains
contrast-enhanced chest CT again suggested for further evaluation.

2. Diffuse bilateral pulmonary infiltrates again noted without
interim change. Bibasilar atelectasis again noted.

## 2021-07-25 IMAGING — DX DG ABDOMEN 1V
1 series · 1 of 1 positions shown · non-contrast
Comparison: None.

CLINICAL DATA: NG tube placement.

EXAM:
ABDOMEN - 1 VIEW

[abdomen kub]
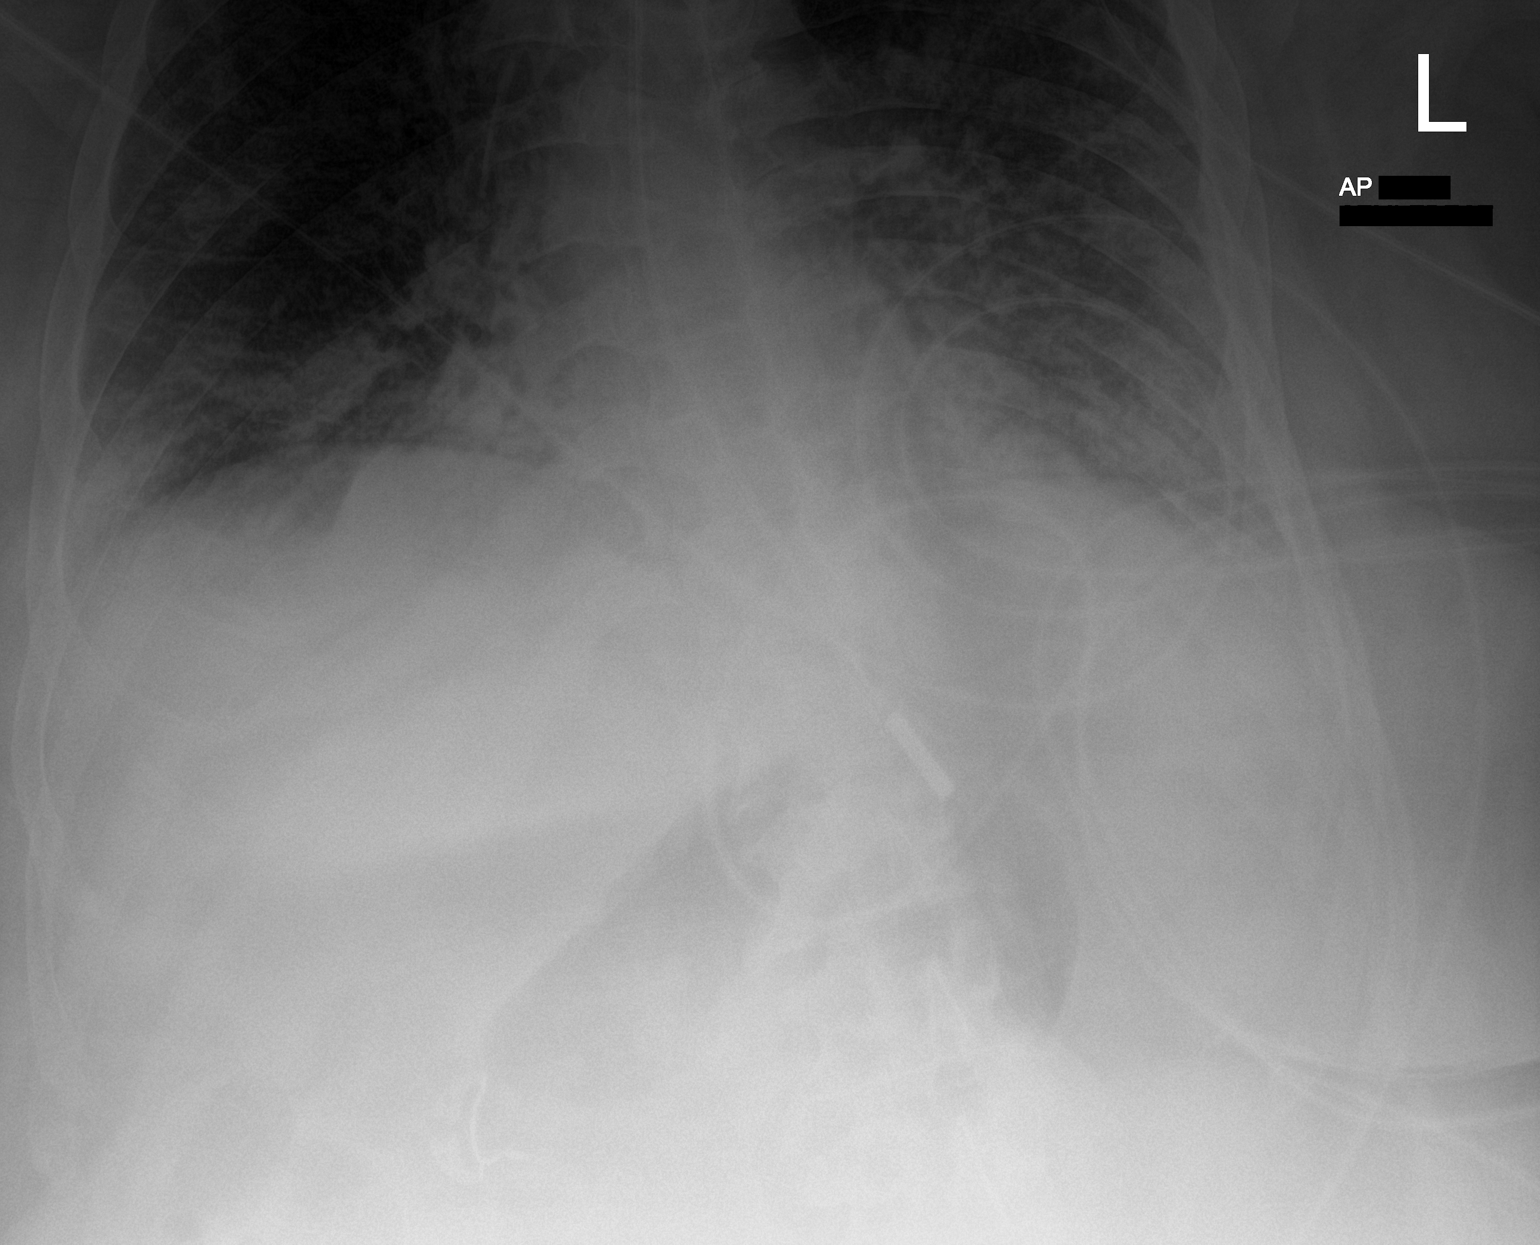

[1 of 1 positions shown; findings below may reference images not displayed]

FINDINGS: Weighted enteric tube in place with tip below the diaphragm in the
left upper quadrant, in the region of the proximal stomach. No bowel
dilatation in the upper abdomen.
IMPRESSION: Weighted enteric tube with tip below the diaphragm in the left upper
quadrant, in the region of the proximal stomach.

## 2021-07-26 IMAGING — DX DG ABDOMEN 1V
1 series · 1 of 1 positions shown · non-contrast
Comparison: 02/25/2020

CLINICAL DATA: NG tube placement

EXAM:
ABDOMEN - 1 VIEW

[abdomen kub]
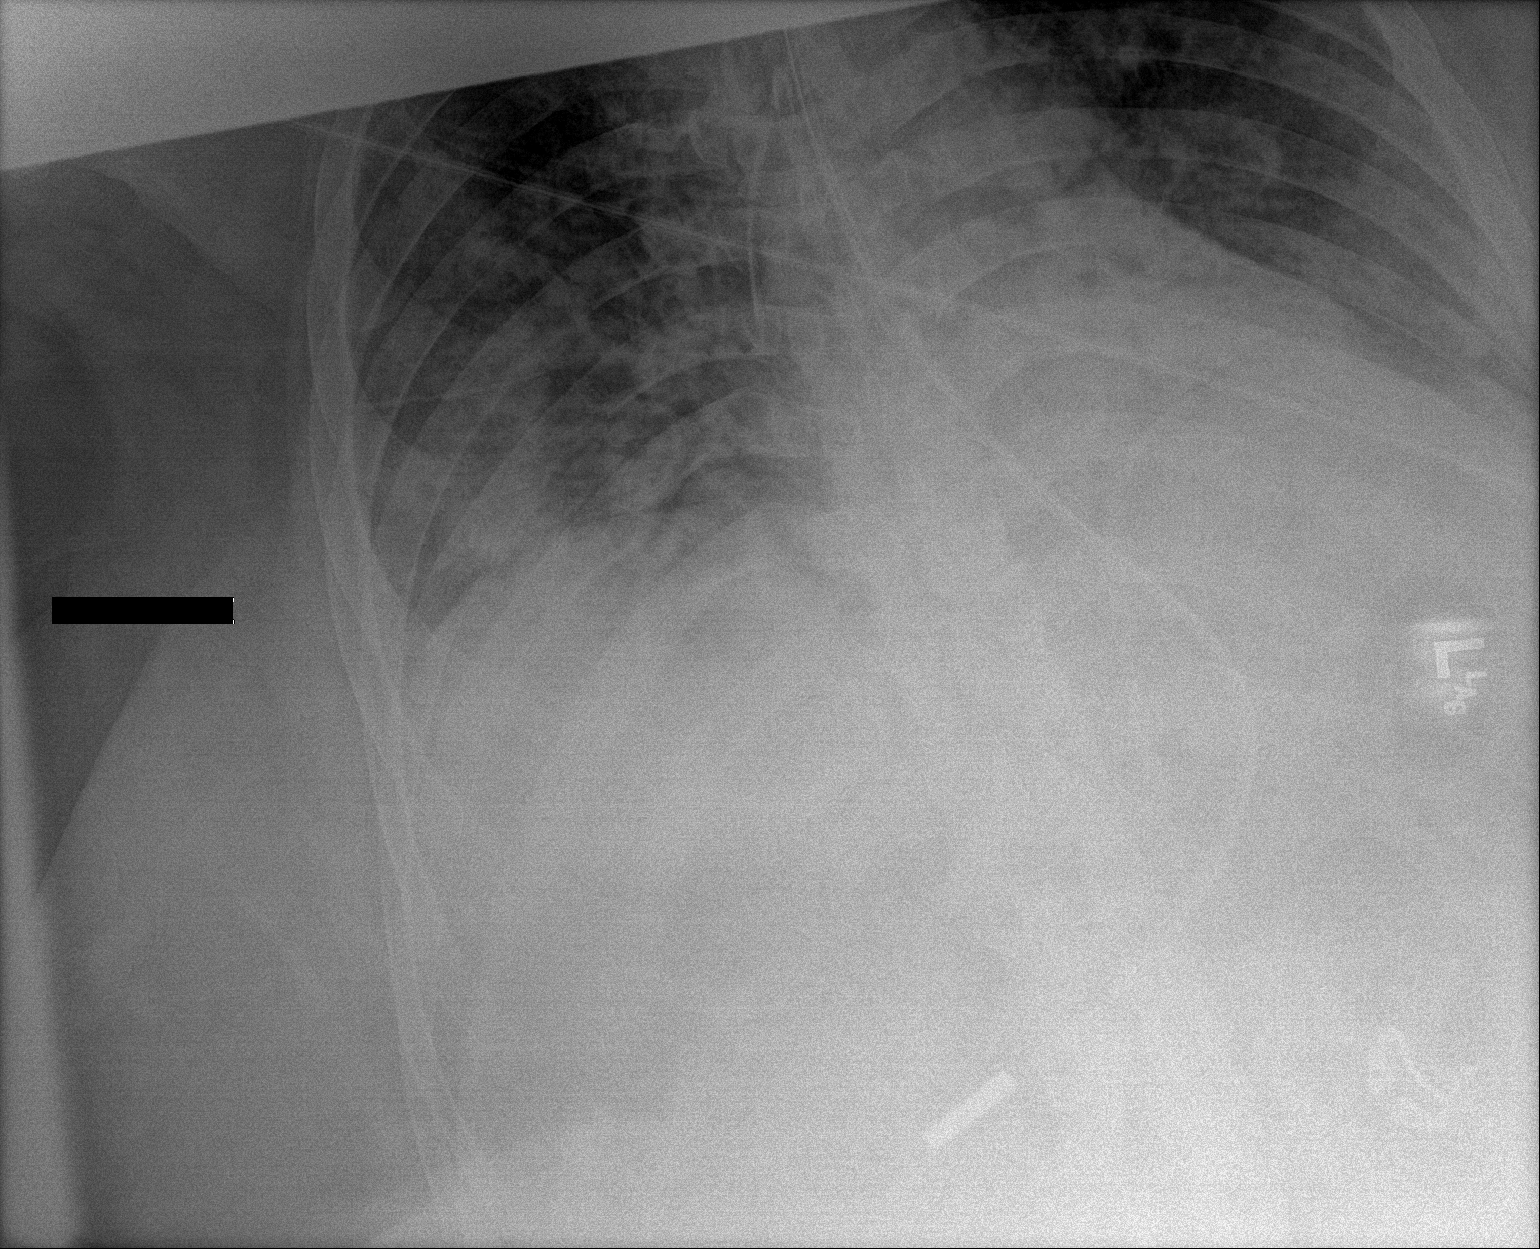

[1 of 1 positions shown; findings below may reference images not displayed]

FINDINGS: Enteric tube has been advanced with tip in the right upper quadrant
consistent with location in the distal stomach. Bilateral pulmonary
infiltrates. Paucity of gas in the visualized abdomen.
IMPRESSION: Enteric tube tip is in the right upper quadrant consistent with
location in the distal stomach.
